# Patient Record
Sex: Female | Born: 1945 | Race: Black or African American | Hispanic: No | Smoking: Former smoker
Health system: Southern US, Community
[De-identification: ages and names within clinical notes are randomized; demographics above are authoritative.]

## PROBLEM LIST (undated history)

## (undated) DIAGNOSIS — E1121 Type 2 diabetes mellitus with diabetic nephropathy: Secondary | ICD-10-CM

## (undated) DIAGNOSIS — N183 Chronic kidney disease, stage 3 (moderate): Secondary | ICD-10-CM

## (undated) DIAGNOSIS — E1165 Type 2 diabetes mellitus with hyperglycemia: Secondary | ICD-10-CM

## (undated) DIAGNOSIS — E785 Hyperlipidemia, unspecified: Secondary | ICD-10-CM

## (undated) DIAGNOSIS — I509 Heart failure, unspecified: Secondary | ICD-10-CM

## (undated) DIAGNOSIS — I10 Essential (primary) hypertension: Secondary | ICD-10-CM

## (undated) DIAGNOSIS — Z8679 Personal history of other diseases of the circulatory system: Secondary | ICD-10-CM

## (undated) DIAGNOSIS — E669 Obesity, unspecified: Secondary | ICD-10-CM

## (undated) DIAGNOSIS — IMO0002 Reserved for concepts with insufficient information to code with codable children: Secondary | ICD-10-CM

## (undated) DIAGNOSIS — Z9889 Other specified postprocedural states: Secondary | ICD-10-CM

## (undated) HISTORY — DX: Personal history of other diseases of the circulatory system: Z86.79

## (undated) HISTORY — DX: Hyperlipidemia, unspecified: E78.5

## (undated) HISTORY — DX: Heart failure, unspecified: I50.9

## (undated) HISTORY — DX: Obesity, unspecified: E66.9

## (undated) HISTORY — DX: Type 2 diabetes mellitus with diabetic nephropathy: E11.21

## (undated) HISTORY — DX: Type 2 diabetes mellitus with hyperglycemia: E11.65

## (undated) HISTORY — DX: Other specified postprocedural states: Z98.890

## (undated) HISTORY — DX: Chronic kidney disease, stage 3 (moderate): N18.3

## (undated) HISTORY — DX: Reserved for concepts with insufficient information to code with codable children: IMO0002

---

## 1991-12-14 DIAGNOSIS — Z9889 Other specified postprocedural states: Secondary | ICD-10-CM

## 1991-12-14 DIAGNOSIS — Z8679 Personal history of other diseases of the circulatory system: Secondary | ICD-10-CM

## 1991-12-14 HISTORY — DX: Personal history of other diseases of the circulatory system: Z86.79

## 1991-12-14 HISTORY — PX: VENTRICULOPERITONEAL SHUNT: SHX204

## 1991-12-14 HISTORY — DX: Other specified postprocedural states: Z98.890

## 1991-12-14 HISTORY — PX: CRANIOTOMY: SHX93

## 2010-11-11 ENCOUNTER — Encounter: Admission: RE | Admit: 2010-11-11 | Discharge: 2010-11-11 | Payer: Self-pay | Admitting: Cardiology

## 2010-12-15 ENCOUNTER — Encounter
Admission: RE | Admit: 2010-12-15 | Discharge: 2010-12-15 | Payer: Self-pay | Source: Home / Self Care | Attending: Cardiology | Admitting: Cardiology

## 2011-02-22 ENCOUNTER — Other Ambulatory Visit: Payer: Self-pay | Admitting: Neurosurgery

## 2011-02-22 DIAGNOSIS — G919 Hydrocephalus, unspecified: Secondary | ICD-10-CM

## 2011-02-24 ENCOUNTER — Other Ambulatory Visit: Payer: Self-pay

## 2011-03-08 ENCOUNTER — Ambulatory Visit
Admission: RE | Admit: 2011-03-08 | Discharge: 2011-03-08 | Disposition: A | Discharge status: Home / Self Care | Payer: Medicare Other | Source: Ambulatory Visit | Attending: Neurosurgery | Admitting: Neurosurgery

## 2011-03-08 DIAGNOSIS — G919 Hydrocephalus, unspecified: Secondary | ICD-10-CM

## 2011-06-14 ENCOUNTER — Encounter: Payer: Self-pay | Admitting: Podiatry

## 2011-06-14 DIAGNOSIS — E119 Type 2 diabetes mellitus without complications: Secondary | ICD-10-CM | POA: Insufficient documentation

## 2011-08-17 ENCOUNTER — Inpatient Hospital Stay (INDEPENDENT_AMBULATORY_CARE_PROVIDER_SITE_OTHER)
Admission: RE | Admit: 2011-08-17 | Discharge: 2011-08-17 | Disposition: A | Discharge status: Home / Self Care | Payer: Medicare Other | Source: Ambulatory Visit | Attending: Family Medicine | Admitting: Family Medicine

## 2011-08-17 DIAGNOSIS — J40 Bronchitis, not specified as acute or chronic: Secondary | ICD-10-CM

## 2011-12-21 ENCOUNTER — Encounter: Payer: Self-pay | Admitting: Emergency Medicine

## 2011-12-21 ENCOUNTER — Emergency Department (INDEPENDENT_AMBULATORY_CARE_PROVIDER_SITE_OTHER)
Admission: EM | Admit: 2011-12-21 | Discharge: 2011-12-21 | Disposition: A | Discharge status: Home / Self Care | Payer: Medicare Other | Source: Home / Self Care | Attending: Emergency Medicine | Admitting: Emergency Medicine

## 2011-12-21 DIAGNOSIS — N390 Urinary tract infection, site not specified: Secondary | ICD-10-CM

## 2011-12-21 DIAGNOSIS — E1165 Type 2 diabetes mellitus with hyperglycemia: Secondary | ICD-10-CM

## 2011-12-21 HISTORY — DX: Essential (primary) hypertension: I10

## 2011-12-21 LAB — GLUCOSE, CAPILLARY

## 2011-12-21 LAB — POCT URINALYSIS DIP (DEVICE)
Bilirubin Urine: NEGATIVE
Glucose, UA: NEGATIVE mg/dL
Ketones, ur: NEGATIVE mg/dL
Specific Gravity, Urine: 1.015 (ref 1.005–1.030)

## 2011-12-21 MED ORDER — PHENAZOPYRIDINE HCL 200 MG PO TABS: 200.0000 mg | ORAL_TABLET | Freq: Three times a day (TID) | ORAL | Status: AC | PRN

## 2011-12-21 MED ORDER — CEPHALEXIN 500 MG PO CAPS: 500.0000 mg | ORAL_CAPSULE | Freq: Three times a day (TID) | ORAL | Status: AC

## 2011-12-21 NOTE — ED Provider Notes (Signed)
Chief Complaint  Patient presents with  . Urinary Tract Infection  . Blood Sugar Problem    History of Present Illness:  Candace Long is a 66 year old female with diabetes and hypertension who comes in today with her son. She's had a two-week history of burning with urination. She denies frequency, urgency, or hematuria. She's had some suprapubic pain over the past week that comes and goes. No fever, chills, nausea, or vomiting. No constipation, diarrhea, or blood in stool. Her son states her appetite has been poor for a while. Her blood sugars have not been very well controlled. She normally sees Dr. Shana Chute, but he is out on medical leave. Over the past week her blood sugars have been running in the 200s. She denies polyuria, polydipsia, or changes in her vision. She's had no hypoglycemia. She has a 14 year history of diabetes. She's on metformin 1000 mg twice a day and Lantus insulin 10 units a day in the morning.  Review of Systems:  Other than noted above, the patient denies any of the following symptoms: General:  No fevers, chills, sweats, aches, or fatigue. GI:  No abdominal pain, back pain, nausea, vomiting, diarrhea, or constipation. GU:  No dysuria, frequency, urgency, hematuria, incontinence. GYN:  No itching, discharge, pelvic pain, or abnormal menses.  PMFSH:  Past medical history, family history, social history, meds, and allergies were reviewed.  Physical Exam:   Vital signs:  BP 142/81  Pulse 106  Temp(Src) 99.1 F (37.3 C) (Oral)  Resp 22  SpO2 97% Gen:  Alert, oriented, in no distress. Lungs:  Clear to auscultation, no wheezes, rales or rhonchi. Heart:  Regular rhythm, no gallop or murmer. Abdomen:  Flat and soft.  There was slight suprapubic tenderness to palpation without guarding or rebound.  No hepato-splenomegaly or mass.  Bowel sounds were normally active. Back:  No CVA tenderness to percussion. Skin:  Clear, warm and dry.  Labs:   Results for orders placed  during the hospital encounter of 12/21/11  GLUCOSE, CAPILLARY      Component Value Range   Glucose-Capillary 223 (*) 70 - 99 (mg/dL)   Comment 1 Documented in Chart     Comment 2 Notify RN     Comment 3 Call MD NNP PA CNM    POCT URINALYSIS DIP (DEVICE)      Component Value Range   Glucose, UA NEGATIVE  NEGATIVE (mg/dL)   Bilirubin Urine NEGATIVE  NEGATIVE    Ketones, ur NEGATIVE  NEGATIVE (mg/dL)   Specific Gravity, Urine 1.015  1.005 - 1.030    Hgb urine dipstick MODERATE (*) NEGATIVE    pH 5.5  5.0 - 8.0    Protein, ur >=300 (*) NEGATIVE (mg/dL)   Urobilinogen, UA 0.2  0.0 - 1.0 (mg/dL)   Nitrite POSITIVE (*) NEGATIVE    Leukocytes, UA LARGE (*) NEGATIVE      Medications given in UCC:  None  Assessment:  Diagnoses that have been ruled out:  Diagnoses that are still under consideration:  Final diagnoses:  UTI (lower urinary tract infection)  Diabetes mellitus type 2, uncontrolled     Plan:   1.  The following meds were prescribed:   New Prescriptions   CEPHALEXIN (KEFLEX) 500 MG CAPSULE    Take 1 capsule (500 mg total) by mouth 3 (three) times daily.   PHENAZOPYRIDINE (PYRIDIUM) 200 MG TABLET    Take 1 tablet (200 mg total) by mouth 3 (three) times daily as needed for pain.  2.  The patient was instructed in symptomatic care and handouts were given. 3.  The patient was told to return if becoming worse in any way, if no better in 3 or 4 days, and given some red flag symptoms that would indicate earlier return. 4.  I suggested she followup with Dr. Sharyn Lull who is covering for Dr. Delane Ginger in about 2 weeks to followup on urinary tract infection and also her diabetes control.  In  the meantime she was instructed to increase her Lantus insulin to 15 units a day in the morning and to continue to monitor her blood sugars, to pay particular attention to hypoglycemia.    Candace Lias, MD 12/21/11 1556

## 2011-12-21 NOTE — ED Notes (Signed)
HERE WITH C/O PRESSURE WITH VOIDING,BURNING AND ITCHING AND ABDOMINAL SORENESS THAT STARTED LAST Monday.PT HAS HX DIABETES CONTROLLED BUT INSULIN AND METFORMIN.CBG THIS AM 214.DENIES BLURRED VISION,VOMITTING.

## 2011-12-21 NOTE — ED Notes (Signed)
Pt unable to provide urine specimen - drinking water at this time.

## 2012-01-12 ENCOUNTER — Other Ambulatory Visit: Payer: Self-pay | Admitting: Cardiology

## 2012-01-12 DIAGNOSIS — Z1231 Encounter for screening mammogram for malignant neoplasm of breast: Secondary | ICD-10-CM

## 2012-01-31 ENCOUNTER — Ambulatory Visit
Admission: RE | Admit: 2012-01-31 | Discharge: 2012-01-31 | Disposition: A | Discharge status: Home / Self Care | Payer: Medicare Other | Source: Ambulatory Visit | Attending: Cardiology | Admitting: Cardiology

## 2012-01-31 DIAGNOSIS — Z1231 Encounter for screening mammogram for malignant neoplasm of breast: Secondary | ICD-10-CM | POA: Diagnosis not present

## 2012-02-03 ENCOUNTER — Other Ambulatory Visit: Payer: Self-pay | Admitting: Cardiology

## 2012-02-03 DIAGNOSIS — R928 Other abnormal and inconclusive findings on diagnostic imaging of breast: Secondary | ICD-10-CM

## 2012-02-25 ENCOUNTER — Other Ambulatory Visit: Payer: Medicare Other

## 2012-02-29 ENCOUNTER — Ambulatory Visit
Admission: RE | Admit: 2012-02-29 | Discharge: 2012-02-29 | Disposition: A | Discharge status: Home / Self Care | Payer: Self-pay | Source: Ambulatory Visit | Attending: Cardiology | Admitting: Cardiology

## 2012-02-29 DIAGNOSIS — R928 Other abnormal and inconclusive findings on diagnostic imaging of breast: Secondary | ICD-10-CM

## 2012-03-06 DIAGNOSIS — M79609 Pain in unspecified limb: Secondary | ICD-10-CM | POA: Diagnosis not present

## 2012-03-06 DIAGNOSIS — B351 Tinea unguium: Secondary | ICD-10-CM | POA: Diagnosis not present

## 2012-08-07 DIAGNOSIS — I10 Essential (primary) hypertension: Secondary | ICD-10-CM | POA: Diagnosis not present

## 2012-08-07 DIAGNOSIS — E7800 Pure hypercholesterolemia, unspecified: Secondary | ICD-10-CM | POA: Diagnosis not present

## 2012-08-07 DIAGNOSIS — E119 Type 2 diabetes mellitus without complications: Secondary | ICD-10-CM | POA: Diagnosis not present

## 2012-08-11 DIAGNOSIS — E119 Type 2 diabetes mellitus without complications: Secondary | ICD-10-CM | POA: Diagnosis not present

## 2012-08-11 DIAGNOSIS — E7800 Pure hypercholesterolemia, unspecified: Secondary | ICD-10-CM | POA: Diagnosis not present

## 2012-08-11 DIAGNOSIS — I10 Essential (primary) hypertension: Secondary | ICD-10-CM | POA: Diagnosis not present

## 2012-10-16 DIAGNOSIS — M79609 Pain in unspecified limb: Secondary | ICD-10-CM | POA: Diagnosis not present

## 2012-10-16 DIAGNOSIS — B351 Tinea unguium: Secondary | ICD-10-CM | POA: Diagnosis not present

## 2012-12-13 HISTORY — PX: CARDIOVASCULAR STRESS TEST: SHX262

## 2012-12-25 ENCOUNTER — Other Ambulatory Visit (HOSPITAL_COMMUNITY): Payer: Self-pay | Admitting: Cardiology

## 2012-12-25 DIAGNOSIS — E7800 Pure hypercholesterolemia, unspecified: Secondary | ICD-10-CM | POA: Diagnosis not present

## 2012-12-25 DIAGNOSIS — R079 Chest pain, unspecified: Secondary | ICD-10-CM

## 2012-12-25 DIAGNOSIS — I10 Essential (primary) hypertension: Secondary | ICD-10-CM | POA: Diagnosis not present

## 2012-12-25 DIAGNOSIS — R0989 Other specified symptoms and signs involving the circulatory and respiratory systems: Secondary | ICD-10-CM | POA: Diagnosis not present

## 2013-01-03 ENCOUNTER — Encounter (HOSPITAL_COMMUNITY): Payer: Medicare Other

## 2013-01-03 ENCOUNTER — Other Ambulatory Visit: Payer: Self-pay | Admitting: Cardiology

## 2013-01-03 DIAGNOSIS — Z1231 Encounter for screening mammogram for malignant neoplasm of breast: Secondary | ICD-10-CM

## 2013-01-08 ENCOUNTER — Encounter (HOSPITAL_COMMUNITY)
Admission: RE | Admit: 2013-01-08 | Discharge: 2013-01-08 | Disposition: A | Discharge status: Home / Self Care | Payer: Medicare Other | Source: Ambulatory Visit | Attending: Cardiology | Admitting: Cardiology

## 2013-01-08 ENCOUNTER — Other Ambulatory Visit: Payer: Self-pay

## 2013-01-08 DIAGNOSIS — R079 Chest pain, unspecified: Secondary | ICD-10-CM | POA: Insufficient documentation

## 2013-01-08 DIAGNOSIS — M79609 Pain in unspecified limb: Secondary | ICD-10-CM | POA: Diagnosis not present

## 2013-01-08 DIAGNOSIS — R0989 Other specified symptoms and signs involving the circulatory and respiratory systems: Secondary | ICD-10-CM | POA: Diagnosis not present

## 2013-01-08 DIAGNOSIS — B351 Tinea unguium: Secondary | ICD-10-CM | POA: Diagnosis not present

## 2013-01-08 DIAGNOSIS — I10 Essential (primary) hypertension: Secondary | ICD-10-CM | POA: Diagnosis not present

## 2013-01-08 MED ORDER — TECHNETIUM TC 99M SESTAMIBI GENERIC - CARDIOLITE
10.0000 | Freq: Once | INTRAVENOUS | Status: AC | PRN
  Administered 2013-01-08: 10 via INTRAVENOUS

## 2013-01-08 MED ORDER — TECHNETIUM TC 99M SESTAMIBI GENERIC - CARDIOLITE
30.0000 | Freq: Once | INTRAVENOUS | Status: AC | PRN
  Administered 2013-01-08: 30 via INTRAVENOUS

## 2013-01-08 MED ORDER — REGADENOSON 0.4 MG/5ML IV SOLN
INTRAVENOUS | Status: AC
  Filled 2013-01-08: qty 5

## 2013-01-08 MED ORDER — REGADENOSON 0.4 MG/5ML IV SOLN
0.4000 mg | Freq: Once | INTRAVENOUS | Status: AC
  Administered 2013-01-08: 0.4 mg via INTRAVENOUS

## 2013-03-02 ENCOUNTER — Ambulatory Visit
Admission: RE | Admit: 2013-03-02 | Discharge: 2013-03-02 | Disposition: A | Discharge status: Home / Self Care | Payer: Medicare Other | Source: Ambulatory Visit | Attending: Cardiology | Admitting: Cardiology

## 2013-03-02 DIAGNOSIS — Z1231 Encounter for screening mammogram for malignant neoplasm of breast: Secondary | ICD-10-CM | POA: Diagnosis not present

## 2013-03-05 LAB — HM MAMMOGRAPHY

## 2013-03-13 ENCOUNTER — Ambulatory Visit: Payer: Medicare Other | Admitting: Family Medicine

## 2013-04-02 DIAGNOSIS — E119 Type 2 diabetes mellitus without complications: Secondary | ICD-10-CM | POA: Diagnosis not present

## 2013-04-02 DIAGNOSIS — E7800 Pure hypercholesterolemia, unspecified: Secondary | ICD-10-CM | POA: Diagnosis not present

## 2013-04-02 DIAGNOSIS — N189 Chronic kidney disease, unspecified: Secondary | ICD-10-CM | POA: Diagnosis not present

## 2013-04-02 DIAGNOSIS — I10 Essential (primary) hypertension: Secondary | ICD-10-CM | POA: Diagnosis not present

## 2013-06-14 DIAGNOSIS — M79609 Pain in unspecified limb: Secondary | ICD-10-CM | POA: Diagnosis not present

## 2013-06-14 DIAGNOSIS — B351 Tinea unguium: Secondary | ICD-10-CM | POA: Diagnosis not present

## 2013-06-29 DIAGNOSIS — E119 Type 2 diabetes mellitus without complications: Secondary | ICD-10-CM | POA: Diagnosis not present

## 2013-06-29 DIAGNOSIS — I10 Essential (primary) hypertension: Secondary | ICD-10-CM | POA: Diagnosis not present

## 2013-06-29 DIAGNOSIS — E7800 Pure hypercholesterolemia, unspecified: Secondary | ICD-10-CM | POA: Diagnosis not present

## 2013-06-29 LAB — LIPID PANEL
Cholesterol: 125 mg/dL (ref 0–200)
Cholesterol: 125 mg/dL (ref 0–200)
HDL: 32 mg/dL — AB (ref 35–70)
HDL: 32 mg/dL — AB (ref 35–70)
LDL (calc): 62
LDL (calc): 62
Triglycerides: 157
Triglycerides: 157

## 2013-06-29 LAB — HEPATIC FUNCTION PANEL
ALK PHOS: 110 U/L
ALK PHOS: 110 U/L
ALT: 13 U/L (ref 7–35)
ALT: 13 U/L (ref 7–35)
AST: 12 U/L
AST: 12 U/L
BILIRUBIN TOTAL: 0.5 mg/dL
BILIRUBIN TOTAL: 0.5 mg/dL

## 2013-06-29 LAB — BASIC METABOLIC PANEL
Creat: 1.52
Creat: 1.52
Potassium: 5 mmol/L
Potassium: 5 mmol/L
SODIUM: 134 mmol/L — AB (ref 137–147)
Sodium: 134 mmol/L — AB (ref 137–147)

## 2013-06-29 LAB — HEMOGLOBIN A1C
A1C: 9.3
A1c: 9.3

## 2013-07-02 DIAGNOSIS — I10 Essential (primary) hypertension: Secondary | ICD-10-CM | POA: Diagnosis not present

## 2013-07-02 DIAGNOSIS — E7800 Pure hypercholesterolemia, unspecified: Secondary | ICD-10-CM | POA: Diagnosis not present

## 2013-07-02 DIAGNOSIS — N189 Chronic kidney disease, unspecified: Secondary | ICD-10-CM | POA: Diagnosis not present

## 2013-08-11 LAB — CREATININE, SERUM: Creat: 1.4

## 2013-09-10 DIAGNOSIS — M79609 Pain in unspecified limb: Secondary | ICD-10-CM | POA: Diagnosis not present

## 2013-09-10 DIAGNOSIS — B351 Tinea unguium: Secondary | ICD-10-CM | POA: Diagnosis not present

## 2013-11-12 ENCOUNTER — Emergency Department (INDEPENDENT_AMBULATORY_CARE_PROVIDER_SITE_OTHER): Payer: Medicare Other

## 2013-11-12 ENCOUNTER — Encounter (HOSPITAL_COMMUNITY): Payer: Self-pay | Admitting: Emergency Medicine

## 2013-11-12 ENCOUNTER — Emergency Department (INDEPENDENT_AMBULATORY_CARE_PROVIDER_SITE_OTHER)
Admission: EM | Admit: 2013-11-12 | Discharge: 2013-11-12 | Disposition: A | Discharge status: Home / Self Care | Payer: Medicare Other | Source: Home / Self Care | Attending: Family Medicine | Admitting: Family Medicine

## 2013-11-12 DIAGNOSIS — J209 Acute bronchitis, unspecified: Secondary | ICD-10-CM | POA: Diagnosis not present

## 2013-11-12 DIAGNOSIS — R05 Cough: Secondary | ICD-10-CM | POA: Diagnosis not present

## 2013-11-12 DIAGNOSIS — R0989 Other specified symptoms and signs involving the circulatory and respiratory systems: Secondary | ICD-10-CM | POA: Diagnosis not present

## 2013-11-12 MED ORDER — DEXTROMETHORPHAN POLISTIREX 30 MG/5ML PO LQCR: 60.0000 mg | Freq: Two times a day (BID) | ORAL | Status: DC

## 2013-11-12 MED ORDER — AZITHROMYCIN 250 MG PO TABS: ORAL_TABLET | ORAL | Status: DC

## 2013-11-12 NOTE — ED Notes (Signed)
Patient assisted into gown.

## 2013-11-12 NOTE — ED Notes (Signed)
Cough, runny nose onset 11/25.  Patient has been using coricidin, no relief

## 2013-11-12 NOTE — ED Provider Notes (Signed)
CSN: [id number]     Arrival date & time 11/12/13  1143 History   First MD Initiated Contact with Patient 11/12/13 1314     Chief Complaint  Patient presents with  . Cough   (Consider location/radiation/quality/duration/timing/severity/associated sxs/prior Treatment) Patient is a 67 y.o. female presenting with cough. The history is provided by the patient.  Cough Cough characteristics:  Non-productive and dry Severity:  Mild Onset quality:  Gradual Duration:  1 week Progression:  Unchanged Chronicity:  New Smoker: no   Context: upper respiratory infection   Ineffective treatments:  Cough suppressants Associated symptoms: rhinorrhea and sinus congestion   Associated symptoms: no chills, no fever and no sore throat     Past Medical History  Diagnosis Date  . Hypertension   . Diabetes mellitus   . Aneurysm 1993   Past Surgical History  Procedure Laterality Date  . Cerebral aneurysm repair     No family history on file. History  Substance Use Topics  . Smoking status: Never Smoker   . Smokeless tobacco: Not on file  . Alcohol Use: No   OB History   Grav Para Term Preterm Abortions TAB SAB Ect Mult Living                 Review of Systems  Constitutional: Negative for fever and chills.  HENT: Positive for congestion, postnasal drip and rhinorrhea. Negative for sore throat.   Respiratory: Positive for cough.   Cardiovascular: Negative.   Gastrointestinal: Negative.     Allergies  Review of patient's allergies indicates no known allergies.  Home Medications   Current Outpatient Rx  Name  Route  Sig  Dispense  Refill  . DM-APAP-CPM (CORICIDIN HBP FLU PO)   Oral   Take by mouth.         . AMLODIPINE BESYLATE PO   Oral   Take 10 mg by mouth daily.           Marland Kitchen azithromycin (ZITHROMAX Z-PAK) 250 MG tablet      Take as directed on pack   6 each   0   . dextromethorphan (DELSYM) 30 MG/5ML liquid   Oral   Take 10 mLs (60 mg total) by mouth 2 (two) times  daily. Prn cough   89 mL   1   . insulin aspart (NOVOLOG) 100 UNIT/ML injection   Subcutaneous   Inject 10 Units into the skin 3 (three) times daily before meals.           Marland Kitchen lisinopril (PRINIVIL,ZESTRIL) 40 MG tablet   Oral   Take 40 mg by mouth daily.           . metFORMIN (GLUCOPHAGE) 1000 MG tablet   Oral   Take 1,000 mg by mouth 2 (two) times daily with a meal.           . minoxidil (LONITEN) 2.5 MG tablet   Oral   Take 2.5 mg by mouth daily.           Marland Kitchen spironolactone (ALDACTONE) 25 MG tablet   Oral   Take 25 mg by mouth daily.            BP 114/77  Pulse 82  Temp(Src) 99.6 F (37.6 C) (Oral)  Resp 18  SpO2 97% Physical Exam  Nursing note and vitals reviewed. Constitutional: She is oriented to person, place, and time. She appears well-developed and well-nourished.  HENT:  Head: Normocephalic.  Right Ear: External ear normal.  Left Ear:  External ear normal.  Mouth/Throat: Oropharynx is clear and moist.  Eyes: Conjunctivae are normal. Pupils are equal, round, and reactive to light.  Neck: Normal range of motion. Neck supple.  Cardiovascular: Normal rate, regular rhythm, normal heart sounds and intact distal pulses.   Pulmonary/Chest: Effort normal and breath sounds normal. She has no wheezes. She has no rales. She exhibits no tenderness.  Lymphadenopathy:    She has no cervical adenopathy.  Neurological: She is alert and oriented to person, place, and time.  Skin: Skin is warm and dry.    ED Course  Procedures (including critical care time) Labs Review Labs Reviewed - No data to display Imaging Review Dg Chest 2 View  11/12/2013   CLINICAL DATA:  Cough and congestion for 1 week.  Rule 0 pneumonia.  EXAM: CHEST  2 VIEW  COMPARISON:  11/11/2010  FINDINGS: Lungs are clear. The cardiomediastinal silhouette is within normal. 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  IMPRESSION: No acute cardiopulmonary disease.   Electronically Signed    By: Elberta Fortis M.D.   On: 11/12/2013 14:13    EKG Interpretation    Date/Time:    Ventricular Rate:    PR Interval:    QRS Duration:   QT Interval:    QTC Calculation:   R Axis:     Text Interpretation:              MDM  X-rays reviewed and report per radiologist.     Linna Hoff, MD 11/12/13 2132

## 2013-12-24 ENCOUNTER — Ambulatory Visit: Payer: Self-pay | Admitting: Podiatry

## 2013-12-31 ENCOUNTER — Encounter: Payer: Self-pay | Admitting: Podiatry

## 2013-12-31 ENCOUNTER — Ambulatory Visit (INDEPENDENT_AMBULATORY_CARE_PROVIDER_SITE_OTHER): Payer: Medicare Other | Admitting: Podiatry

## 2013-12-31 VITALS — BP 130/76 | HR 97 | Resp 16

## 2013-12-31 DIAGNOSIS — B351 Tinea unguium: Secondary | ICD-10-CM

## 2013-12-31 DIAGNOSIS — M79609 Pain in unspecified limb: Secondary | ICD-10-CM | POA: Diagnosis not present

## 2013-12-31 NOTE — Patient Instructions (Signed)
Diabetes and Foot Care Diabetes may cause you to have problems because of poor blood supply (circulation) to your feet and legs. This may cause the skin on your feet to become thinner, break easier, and heal more slowly. Your skin may become dry, and the skin may peel and crack. You may also have nerve damage in your legs and feet causing decreased feeling in them. You may not notice minor injuries to your feet that could lead to infections or more serious problems. Taking care of your feet is one of the most important things you can do for yourself.  HOME CARE INSTRUCTIONS  Wear shoes at all times, even in the house. Do not go barefoot. Bare feet are easily injured.  Check your feet daily for blisters, cuts, and redness. If you cannot see the bottom of your feet, use a mirror or ask someone for help.  Wash your feet with warm water (do not use hot water) and mild soap. Then pat your feet and the areas between your toes until they are completely dry. Do not soak your feet as this can dry your skin.  Apply a moisturizing lotion or petroleum jelly (that does not contain alcohol and is unscented) to the skin on your feet and to dry, brittle toenails. Do not apply lotion between your toes.  Trim your toenails straight across. Do not dig under them or around the cuticle. File the edges of your nails with an emery board or nail file.  Do not cut corns or calluses or try to remove them with medicine.  Wear clean socks or stockings every day. Make sure they are not too tight. Do not wear knee-high stockings since they may decrease blood flow to your legs.  Wear shoes that fit properly and have enough cushioning. To break in new shoes, wear them for just a few hours a day. This prevents you from injuring your feet. Always look in your shoes before you put them on to be sure there are no objects inside.  Do not cross your legs. This may decrease the blood flow to your feet.  If you find a minor scrape,  cut, or break in the skin on your feet, keep it and the skin around it clean and dry. These areas may be cleansed with mild soap and water. Do not cleanse the area with peroxide, alcohol, or iodine.  When you remove an adhesive bandage, be sure not to damage the skin around it.  If you have a wound, look at it several times a day to make sure it is healing.  Do not use heating pads or hot water bottles. They may burn your skin. If you have lost feeling in your feet or legs, you may not know it is happening until it is too late.  Make sure your health care provider performs a complete foot exam at least annually or more often if you have foot problems. Report any cuts, sores, or bruises to your health care provider immediately. SEEK MEDICAL CARE IF:   You have an injury that is not healing.  You have cuts or breaks in the skin.  You have an ingrown nail.  You notice redness on your legs or feet.  You feel burning or tingling in your legs or feet.  You have pain or cramps in your legs and feet.  Your legs or feet are numb.  Your feet always feel cold. SEEK IMMEDIATE MEDICAL CARE IF:   There is increasing redness,   swelling, or pain in or around a wound.  There is a red line that goes up your leg.  Pus is coming from a wound.  You develop a fever or as directed by your health care provider.  You notice a bad smell coming from an ulcer or wound. Document Released: 11/26/2000 Document Revised: 08/01/2013 Document Reviewed: 05/08/2013 [employer] Patient Information 2014 [employer].  

## 2013-12-31 NOTE — Progress Notes (Signed)
Subjective:     Patient ID: Candace Long, female   DOB: 1946/06/25, 68 y.o.   MRN: [id number]  HPI patient presents with nail disease and thickness 1-5 both feet   Review of Systems     Objective:   Physical Exam Neurovascular status unchanged with mycotic painful nailbeds 1-5 both feet    Assessment:     Debridement painful nailbeds 1-5 both feet with no iatrogenic bleeding noted    Plan:     Instructed on debridement of painful nail bed 1-5 both feet

## 2014-01-28 DIAGNOSIS — K1370 Unspecified lesions of oral mucosa: Secondary | ICD-10-CM | POA: Diagnosis not present

## 2014-02-05 ENCOUNTER — Ambulatory Visit: Payer: Medicare Other | Admitting: Family Medicine

## 2014-02-10 LAB — HM DIABETES EYE EXAM

## 2014-02-18 ENCOUNTER — Other Ambulatory Visit: Payer: Self-pay | Admitting: Family Medicine

## 2014-02-18 ENCOUNTER — Encounter: Payer: Self-pay | Admitting: Family Medicine

## 2014-02-18 ENCOUNTER — Ambulatory Visit (INDEPENDENT_AMBULATORY_CARE_PROVIDER_SITE_OTHER): Payer: Medicare Other | Admitting: Family Medicine

## 2014-02-18 VITALS — BP 138/84 | HR 88 | Temp 98.0°F | Ht 69.0 in | Wt 260.8 lb

## 2014-02-18 DIAGNOSIS — Z23 Encounter for immunization: Secondary | ICD-10-CM

## 2014-02-18 DIAGNOSIS — E1165 Type 2 diabetes mellitus with hyperglycemia: Secondary | ICD-10-CM

## 2014-02-18 DIAGNOSIS — I10 Essential (primary) hypertension: Secondary | ICD-10-CM

## 2014-02-18 DIAGNOSIS — E785 Hyperlipidemia, unspecified: Secondary | ICD-10-CM | POA: Diagnosis not present

## 2014-02-18 DIAGNOSIS — IMO0002 Reserved for concepts with insufficient information to code with codable children: Secondary | ICD-10-CM

## 2014-02-18 DIAGNOSIS — IMO0001 Reserved for inherently not codable concepts without codable children: Secondary | ICD-10-CM

## 2014-02-18 DIAGNOSIS — Z1231 Encounter for screening mammogram for malignant neoplasm of breast: Secondary | ICD-10-CM

## 2014-02-18 DIAGNOSIS — E669 Obesity, unspecified: Secondary | ICD-10-CM

## 2014-02-18 DIAGNOSIS — E1121 Type 2 diabetes mellitus with diabetic nephropathy: Secondary | ICD-10-CM | POA: Insufficient documentation

## 2014-02-18 LAB — BASIC METABOLIC PANEL
BUN: 25 mg/dL — AB (ref 6–23)
CO2: 24 mEq/L (ref 19–32)
Calcium: 9.8 mg/dL (ref 8.4–10.5)
Chloride: 103 mEq/L (ref 96–112)
Creatinine, Ser: 1.6 mg/dL — ABNORMAL HIGH (ref 0.4–1.2)
GFR: 41.25 mL/min — ABNORMAL LOW (ref >60.00)
Glucose, Bld: 299 mg/dL — ABNORMAL HIGH (ref 70–99)
POTASSIUM: 4.8 meq/L (ref 3.5–5.1)
SODIUM: 136 meq/L (ref 135–145)

## 2014-02-18 LAB — HEMOGLOBIN A1C: HEMOGLOBIN A1C: 10 % — AB (ref 4.6–6.5)

## 2014-02-18 MED ORDER — ATORVASTATIN CALCIUM 40 MG PO TABS: 40.0000 mg | ORAL_TABLET | Freq: Every day | ORAL | Status: DC

## 2014-02-18 MED ORDER — METFORMIN HCL 1000 MG PO TABS: 1000.0000 mg | ORAL_TABLET | Freq: Two times a day (BID) | ORAL | Status: DC

## 2014-02-18 MED ORDER — AMLODIPINE BESYLATE 10 MG PO TABS: 10.0000 mg | ORAL_TABLET | Freq: Every day | ORAL | Status: DC

## 2014-02-18 MED ORDER — SPIRONOLACTONE 25 MG PO TABS: 25.0000 mg | ORAL_TABLET | Freq: Every day | ORAL | Status: DC

## 2014-02-18 MED ORDER — INSULIN GLARGINE 100 UNIT/ML ~~LOC~~ SOLN: 21.0000 [IU] | Freq: Every day | SUBCUTANEOUS | Status: DC

## 2014-02-18 MED ORDER — HYDROCHLOROTHIAZIDE 25 MG PO TABS: 25.0000 mg | ORAL_TABLET | Freq: Every day | ORAL | Status: DC

## 2014-02-18 MED ORDER — MINOXIDIL 2.5 MG PO TABS: 2.5000 mg | ORAL_TABLET | Freq: Every day | ORAL | Status: DC

## 2014-02-18 MED ORDER — LISINOPRIL 40 MG PO TABS: 40.0000 mg | ORAL_TABLET | Freq: Every day | ORAL | Status: DC

## 2014-02-18 NOTE — Assessment & Plan Note (Signed)
Body mass index is 38.5 kg/(m^2).

## 2014-02-18 NOTE — Patient Instructions (Addendum)
Prevnar today. Blood work today. Change lantus 21 units to night time. Call your insurance about the shingles shot to see if it is covered or how much it would cost and where is cheaper (here or pharmacy).  If you want to receive here, call for nurse visit. We will call you to set up diabetes education. Schedule dentist and eye doctor appointment. Return to see me in 3 months for medicare wellness visit

## 2014-02-18 NOTE — Progress Notes (Signed)
Pre visit review using our clinic review tool, if applicable. No additional management support is needed unless otherwise documented below in the visit note. 

## 2014-02-18 NOTE — Assessment & Plan Note (Signed)
Chronic, stable. Sounds like difficult to control in past.  On 5 meds currently well controlled.

## 2014-02-18 NOTE — Assessment & Plan Note (Signed)
Compliant with metformin but forgets lantus - son helps her administer this and misses some mornings.  Advised change lantus to nightly and incorporate into bedtime routine. Uncontrolled based on recent sugars. Check Alc today. Foot exam today.  rec schedule eye exam. Will refer to diabetes education.

## 2014-02-18 NOTE — Progress Notes (Signed)
BP 138/84  Pulse 88  Temp(Src) 98 F (36.7 C) (Oral)  Ht 5\' 9"  (1.753 m)  Wt 260 lb 12.8 oz (118.298 kg)  BMI 38.50 kg/m2   CC: new pt to establish care  Subjective:    Patient ID: Candace Long, female    DOB: Apr 19, 1946, 68 y.o.   MRN: [id number]  HPI: Candace Long is a 68 y.o. female presenting on 02/18/2014 with Establish Care   Candace Long presents today with her son to establish care.  Prior saw Dr. Elsie Stain.  I have requested records from him today.  HTN - compliant with multiple med regimen including amlodipine 10mg  daily, hctz 25mg  daily, lisinopril 40mg  daily, minoxidil 2.5mg  daily, spironolactone 25mg  daily.  No HA, vision changes, CP/tightness, leg swelling.  Some dyspnea.  DM - complaint with metfomin 1000mg  bid as well as lantus 21u Qdaily.  Sometimes forgets to take in am - due to schedule changes.  Due for eye exam.  No paresthesias.  No low sugars of hypoglycemic sxs.  Brings log of sugars over last several months that range 180s-310.  Doesn't check daily.  Sees podiatrist regularly.  HLD - compliant with lipitor without myalgias.  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  Requests rec for local dentist. Requests legs taken a look at.  Wt Readings from Last 3 Encounters:  02/18/14 260 lb 12.8 oz (118.298 kg)    Preventative: On medicare for years.  No recent blood work.  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  Allergies and medications reviewed and  updated. Current Outpatient Prescriptions on File Prior to Visit  Medication Sig  . AMLODIPINE BESYLATE PO Take 10 mg by mouth daily.    Marland Kitchen lisinopril (PRINIVIL,ZESTRIL) 40 MG tablet Take 40 mg by mouth daily.    . metFORMIN (GLUCOPHAGE) 1000 MG tablet Take 1,000 mg by mouth 2 (two) times daily with a meal.    . minoxidil (LONITEN) 2.5 MG tablet Take 2.5 mg by mouth daily.    Marland Kitchen spironolactone (ALDACTONE) 25 MG tablet Take 25 mg by mouth daily.     No current facility-administered medications on file prior to visit.    Review of Systems Per HPI unless specifically indicated above    Objective:    BP 138/84  Pulse 88  Temp(Src) 98 F (36.7 C) (Oral)  Ht 5\' 9"  (1.753 m)  Wt 260 lb 12.8 oz (118.298 kg)  BMI 38.50 kg/m2  Physical Exam  Nursing note and vitals reviewed. Constitutional: She is oriented to person, place, and time. She appears well-developed and well-nourished. No distress.  HENT:  Head: Normocephalic and atraumatic.  Right Ear: Hearing, tympanic membrane, external ear and ear canal normal.  Left Ear: Hearing, tympanic membrane, external ear and ear canal normal.  Nose: Nose normal.  Mouth/Throat: Uvula is midline, oropharynx is clear and moist and mucous membranes are normal. No oropharyngeal exudate, posterior oropharyngeal edema or posterior oropharyngeal erythema.  Eyes: Conjunctivae and EOM are normal. Pupils are equal, round, and reactive to light. No scleral icterus.  Neck: Normal range of motion. Neck supple. Carotid bruit is not present.  Cardiovascular: Normal rate, regular rhythm, normal heart sounds and intact distal pulses.   No murmur heard. Pulses:      Radial pulses are 2+ on the right side, and 2+ on the left side.  Pulmonary/Chest: Effort normal and breath sounds normal. No respiratory distress. She has no wheezes. She has no rales.  Abdominal: Soft. Bowel sounds are normal. She exhibits no distension and no mass. There is no tenderness. There is no  rebound and no guarding.  Musculoskeletal: Normal range of motion. She exhibits no edema.  Diabetic foot exam: Normal inspection No skin breakdown No calluses  Normal DP/PT pulses Normal sensation to light touch and monofilament Nails normal  Lymphadenopathy:    She has no cervical adenopathy.  Neurological: She is alert and oriented to person, place, and time.  CN grossly intact, station and gait intact  Skin: Skin is warm and dry. No rash noted.  hyperkeratotic nodules left lateral lower extremity  Psychiatric: She has a normal mood and affect. Her behavior is normal. Judgment and thought content normal.       Assessment & Plan:   Problem List Items Addressed This Visit   Diabetes type 2, uncontrolled - Primary     Compliant with metformin but forgets lantus - son helps her administer this and misses some mornings.  Advised change lantus to nightly and incorporate into bedtime routine. Uncontrolled based on recent sugars. Check Alc today. Foot exam today.  rec schedule eye exam. Will refer to diabetes education.    Relevant Medications      insulin glargine (LANTUS) 100 UNIT/ML injection      atorvastatin (LIPITOR) 40 MG tablet   Other Relevant Orders      Ambulatory referral to Ophthalmology      Ambulatory referral to diabetic education      Hemoglobin A1c      Basic metabolic panel      HM DIABETES FOOT EXAM (Completed)   HLD (hyperlipidemia)     Chronic, continue liptor. Check FLP next fasting blood work    Relevant Medications      hydrochlorothiazide (HYDRODIURIL) 25 MG tablet   Hypertension     Chronic, stable. Sounds like difficult to control in past.  On 5 meds currently well controlled.    Obesity     Body mass index is 38.5 kg/(m^2).      Other Visit Diagnoses   Immunization due        Relevant Orders       Pneumococcal conjugate vaccine 13-valent        Follow up plan: Return in about 3 months (around 05/21/2014), or as needed, for medicare  wellness visit.

## 2014-02-18 NOTE — Assessment & Plan Note (Signed)
Chronic, continue liptor. Check FLP next fasting blood work

## 2014-02-19 ENCOUNTER — Telehealth: Payer: Self-pay | Admitting: Family Medicine

## 2014-02-19 NOTE — Telephone Encounter (Signed)
Relevant patient education assigned to patient using Emmi. ° °

## 2014-02-20 ENCOUNTER — Telehealth: Payer: Self-pay

## 2014-02-20 NOTE — Telephone Encounter (Signed)
Relevant patient education assigned to patient using Emmi. ° °

## 2014-02-25 ENCOUNTER — Encounter: Payer: Self-pay | Admitting: Family Medicine

## 2014-02-25 ENCOUNTER — Other Ambulatory Visit: Payer: Self-pay | Admitting: Family Medicine

## 2014-03-04 ENCOUNTER — Ambulatory Visit: Payer: Medicare Other

## 2014-03-11 ENCOUNTER — Ambulatory Visit
Admission: RE | Admit: 2014-03-11 | Discharge: 2014-03-11 | Disposition: A | Discharge status: Home / Self Care | Payer: Medicare Other | Source: Ambulatory Visit | Attending: Family Medicine | Admitting: Family Medicine

## 2014-03-11 DIAGNOSIS — Z1231 Encounter for screening mammogram for malignant neoplasm of breast: Secondary | ICD-10-CM | POA: Diagnosis not present

## 2014-03-14 ENCOUNTER — Encounter: Payer: Self-pay | Admitting: Family Medicine

## 2014-03-25 ENCOUNTER — Encounter: Payer: Self-pay | Admitting: Podiatry

## 2014-03-25 ENCOUNTER — Ambulatory Visit (INDEPENDENT_AMBULATORY_CARE_PROVIDER_SITE_OTHER): Payer: Medicare Other | Admitting: Podiatry

## 2014-03-25 DIAGNOSIS — M79609 Pain in unspecified limb: Secondary | ICD-10-CM | POA: Diagnosis not present

## 2014-03-25 DIAGNOSIS — B351 Tinea unguium: Secondary | ICD-10-CM

## 2014-03-26 NOTE — Progress Notes (Signed)
Subjective:     Patient ID: Candace Long, female   DOB: Jan 29, 1946, 68 y.o.   MRN: [id number]  HPI patient is found to have thick nailbeds 1-5 both feet that she can not cut herself and are painful   Review of Systems     Objective:   Physical Exam Brittle nailbeds 1-5 both feet that are painful and thick    Assessment:     Mycotic nail infection with pain 1-5 both feet    Plan:     Debridement painful nailbeds 1-5 both feet with no iatrogenic bleeding noted

## 2014-03-29 ENCOUNTER — Ambulatory Visit: Payer: Medicare Other | Admitting: *Deleted

## 2014-03-29 ENCOUNTER — Ambulatory Visit: Payer: Medicare Other | Admitting: Dietician

## 2014-04-01 ENCOUNTER — Encounter: Payer: Self-pay | Admitting: *Deleted

## 2014-04-02 ENCOUNTER — Encounter: Payer: Self-pay | Admitting: *Deleted

## 2014-04-12 DIAGNOSIS — H2510 Age-related nuclear cataract, unspecified eye: Secondary | ICD-10-CM | POA: Diagnosis not present

## 2014-04-12 DIAGNOSIS — H04129 Dry eye syndrome of unspecified lacrimal gland: Secondary | ICD-10-CM | POA: Diagnosis not present

## 2014-04-12 DIAGNOSIS — E119 Type 2 diabetes mellitus without complications: Secondary | ICD-10-CM | POA: Diagnosis not present

## 2014-05-07 ENCOUNTER — Ambulatory Visit: Payer: Medicare Other | Admitting: *Deleted

## 2014-05-24 ENCOUNTER — Encounter (INDEPENDENT_AMBULATORY_CARE_PROVIDER_SITE_OTHER): Payer: Self-pay

## 2014-05-24 ENCOUNTER — Ambulatory Visit (INDEPENDENT_AMBULATORY_CARE_PROVIDER_SITE_OTHER): Payer: Medicare Other | Admitting: Family Medicine

## 2014-05-24 ENCOUNTER — Encounter: Payer: Self-pay | Admitting: Family Medicine

## 2014-05-24 VITALS — BP 134/90 | HR 80 | Temp 98.6°F | Ht 69.0 in | Wt 255.8 lb

## 2014-05-24 DIAGNOSIS — N183 Chronic kidney disease, stage 3 unspecified: Secondary | ICD-10-CM

## 2014-05-24 DIAGNOSIS — I10 Essential (primary) hypertension: Secondary | ICD-10-CM

## 2014-05-24 DIAGNOSIS — R413 Other amnesia: Secondary | ICD-10-CM

## 2014-05-24 DIAGNOSIS — Z0000 Encounter for general adult medical examination without abnormal findings: Secondary | ICD-10-CM | POA: Insufficient documentation

## 2014-05-24 DIAGNOSIS — G3184 Mild cognitive impairment, so stated: Secondary | ICD-10-CM | POA: Insufficient documentation

## 2014-05-24 DIAGNOSIS — E785 Hyperlipidemia, unspecified: Secondary | ICD-10-CM | POA: Diagnosis not present

## 2014-05-24 DIAGNOSIS — IMO0001 Reserved for inherently not codable concepts without codable children: Secondary | ICD-10-CM | POA: Diagnosis not present

## 2014-05-24 DIAGNOSIS — E669 Obesity, unspecified: Secondary | ICD-10-CM

## 2014-05-24 DIAGNOSIS — IMO0002 Reserved for concepts with insufficient information to code with codable children: Secondary | ICD-10-CM

## 2014-05-24 DIAGNOSIS — Z1211 Encounter for screening for malignant neoplasm of colon: Secondary | ICD-10-CM | POA: Diagnosis not present

## 2014-05-24 DIAGNOSIS — N289 Disorder of kidney and ureter, unspecified: Secondary | ICD-10-CM | POA: Diagnosis not present

## 2014-05-24 DIAGNOSIS — E1165 Type 2 diabetes mellitus with hyperglycemia: Secondary | ICD-10-CM

## 2014-05-24 HISTORY — DX: Chronic kidney disease, stage 3 unspecified: N18.30

## 2014-05-24 LAB — CBC WITH DIFFERENTIAL/PLATELET
Basophils Absolute: 0 10*3/uL (ref 0.0–0.1)
Basophils Relative: 0.4 % (ref 0.0–3.0)
EOS ABS: 0.2 10*3/uL (ref 0.0–0.7)
Eosinophils Relative: 2.6 % (ref 0.0–5.0)
HCT: 35.3 % — ABNORMAL LOW (ref 36.0–46.0)
Hemoglobin: 11.4 g/dL — ABNORMAL LOW (ref 12.0–15.0)
Lymphocytes Relative: 22.1 % (ref 12.0–46.0)
Lymphs Abs: 2 10*3/uL (ref 0.7–4.0)
MCHC: 32.3 g/dL (ref 30.0–36.0)
MCV: 86.8 fl (ref 78.0–100.0)
MONO ABS: 0.4 10*3/uL (ref 0.1–1.0)
Monocytes Relative: 4.5 % (ref 3.0–12.0)
Neutro Abs: 6.3 10*3/uL (ref 1.4–7.7)
Neutrophils Relative %: 70.4 % (ref 43.0–77.0)
PLATELETS: 534 10*3/uL — AB (ref 150.0–400.0)
RBC: 4.06 Mil/uL (ref 3.87–5.11)
RDW: 13.8 % (ref 11.5–15.5)
WBC: 9 10*3/uL (ref 4.0–10.5)

## 2014-05-24 LAB — COMPREHENSIVE METABOLIC PANEL
ALBUMIN: 4 g/dL (ref 3.5–5.2)
ALK PHOS: 146 U/L — AB (ref 39–117)
ALT: 17 U/L (ref 0–35)
AST: 17 U/L (ref 0–37)
BUN: 20 mg/dL (ref 6–23)
CO2: 25 mEq/L (ref 19–32)
Calcium: 9.8 mg/dL (ref 8.4–10.5)
Chloride: 99 mEq/L (ref 96–112)
Creatinine, Ser: 1.6 mg/dL — ABNORMAL HIGH (ref 0.4–1.2)
GFR: 43.06 mL/min — ABNORMAL LOW (ref >60.00)
Glucose, Bld: 230 mg/dL — ABNORMAL HIGH (ref 70–99)
POTASSIUM: 5.3 meq/L — AB (ref 3.5–5.1)
SODIUM: 132 meq/L — AB (ref 135–145)
Total Bilirubin: 0.5 mg/dL (ref 0.2–1.2)
Total Protein: 7.6 g/dL (ref 6.0–8.3)

## 2014-05-24 LAB — LIPID PANEL
Cholesterol: 115 mg/dL (ref 0–200)
HDL: 35.6 mg/dL — ABNORMAL LOW (ref >39.00)
LDL CALC: 57 mg/dL (ref 0–99)
NONHDL: 79.4
TRIGLYCERIDES: 112 mg/dL (ref 0.0–149.0)
Total CHOL/HDL Ratio: 3
VLDL: 22.4 mg/dL (ref 0.0–40.0)

## 2014-05-24 LAB — VITAMIN B12: VITAMIN B 12: 350 pg/mL (ref 211–911)

## 2014-05-24 LAB — HEMOGLOBIN A1C: Hgb A1c MFr Bld: 11.3 % — ABNORMAL HIGH (ref 4.6–6.5)

## 2014-05-24 LAB — TSH: TSH: 0.81 u[IU]/mL (ref 0.35–4.50)

## 2014-05-24 NOTE — Assessment & Plan Note (Signed)
Body mass index is 37.75 kg/(m^2).

## 2014-05-24 NOTE — Assessment & Plan Note (Signed)
I have personally reviewed the Medicare Annual Wellness questionnaire and have noted 1. The patient's medical and social history 2. Their use of alcohol, tobacco or illicit drugs 3. Their current medications and supplements 4. The patient's functional ability including ADL's, fall risks, home safety risks and hearing or visual impairment. 5. Diet and physical activity 6. Evidence for depression or mood disorders The patients weight, height, BMI have been recorded in the chart.  Hearing and vision has been addressed. I have made referrals, counseling and provided education to the patient based review of the above and I have provided the pt with a written personalized care plan for preventive services. See scanned questionairre. Advanced directives discussed: son is HCPOA, will scan POA form into chart.  Reviewed preventative protocols and updated unless pt declined. Declines breast, pelvic/pap today - states she will reschedule this.

## 2014-05-24 NOTE — Assessment & Plan Note (Signed)
Found on recent blood work, recheck today.

## 2014-05-24 NOTE — Patient Instructions (Signed)
Call your insurace about the shingles shot to see if it is covered or how much it would cost and where is cheaper (here or pharmacy).  If you want to receive here, call for nurse visit. We (or GI department)will call you to schedule colonoscopy. We will call you to schedule bone density scan. I recommend coming back for a pelvic exam/pap smear at your convenience Return in 6-8 weeks for recheck diabetes and geriatric assessment (further memory testing) Good to see you, call us with questions.

## 2014-05-24 NOTE — Assessment & Plan Note (Signed)
Pretty significant memory deficits on testing today, will return for formal geriatric assessment. Son attributes this to h/o cerebral aneurysm.

## 2014-05-24 NOTE — Assessment & Plan Note (Signed)
Chronically uncontrolled. Discussed importance of regular lantus use. Check A1c - if elevated again, will slowly titrate med accordingly.

## 2014-05-24 NOTE — Assessment & Plan Note (Signed)
Chronic, stable. Continue lipitor.  

## 2014-05-24 NOTE — Assessment & Plan Note (Signed)
Chronic, stable. Continue current 53med regimen.

## 2014-05-24 NOTE — Progress Notes (Signed)
Pre visit review using our clinic review tool, if applicable. No additional management support is needed unless otherwise documented below in the visit note. 

## 2014-05-24 NOTE — Progress Notes (Signed)
BP 134/90  Pulse 80  Temp(Src) 98.6 F (37 C) (Oral)  Ht 5\' 9"  (1.753 m)  Wt 255 lb 12 oz (116.007 kg)  BMI 37.75 kg/m2   CC: subsequent medicare wellness  Subjective:    Patient ID: Candace Long, female    DOB: 1946/06/14, 68 y.o.   MRN: [id number]  HPI: Candace Long is a 68 y.o. female presenting on 05/24/2014 for Annual Exam   DM - checks fasting in am. This am sugar 200s, did not receive lantus last night 2/2 arriving late. Usually 150s. Compliant with meds.  Memory trouble - since aneurysm 1991. Son doesn't think it has gotten significantly worse.  Long term memory is fine.  Short term memory has been impaired for last 20+ yrs.  Failed hearing screen. Worked in [employer] fund[employer].  Recent vision screen. Denies depression, falls or anhedonia.  Preventative:  On medicare for years. No recent blood work. H/o disability after cerebral aneurysm repaired  Mammogram 03/2014 WNL.  Colon cancer screening - discussed. Would like to schedule colonoscopy. Dexa - would like to schedule. Well woman - no recent pelvic exam. Requests deferred today, will reschedule. Flu - didn't get  Prevnar early 02/2014 Shingles - not done  Advanced directive - Candace Long is [name].  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 Craniotomy  1993    cerebral aneurysm repair  . Ventriculoperitoneal shunt  1993  . Cardiovascular stress test  12/2012    WNL EF 55% ([facility]wani)     Relevant past medical, surgical, family and social history reviewed and updated as indicated.  Allergies and medications reviewed and updated. Current Outpatient Prescriptions on File Prior to Visit  Medication Sig  . amLODipine (NORVASC) 10 MG tablet Take 1 tablet (10 mg total) by mouth daily.  Marland Kitchen  atorvastatin (LIPITOR) 40 MG tablet Take 1 tablet (40 mg total) by mouth daily.  . hydrochlorothiazide (HYDRODIURIL) 25 MG tablet Take 1 tablet (25 mg total) by mouth daily.  . insulin glargine (LANTUS) 100 UNIT/ML injection Inject 0.21 mLs (21 Units total) into the skin daily. Qs 3 months  . lisinopril (PRINIVIL,ZESTRIL) 40 MG tablet Take 1 tablet (40 mg total) by mouth daily.  . metFORMIN (GLUCOPHAGE) 1000 MG tablet Take 1 tablet (1,000 mg total) by mouth 2 (two) times daily with a meal.  . minoxidil (LONITEN) 2.5 MG tablet Take 1 tablet (2.5 mg total) by mouth daily.  Marland Kitchen spironolactone (ALDACTONE) 25 MG tablet Take 1 tablet (25 mg total) by mouth daily.   No current facility-administered medications on file prior to visit.    Review of Systems Per HPI unless specifically indicated above    Objective:    BP 134/90  Pulse 80  Temp(Src) 98.6 F (37 C) (Oral)  Ht 5\' 9"  (1.753 m)  Wt 255 lb 12 oz (116.007 kg)  BMI 37.75 kg/m2  Physical Exam  Nursing note and vitals reviewed. Constitutional: She is oriented to person, place, and time. She appears well-developed and well-nourished. No distress.  HENT:  Head: Normocephalic and atraumatic.  Right Ear: Hearing, tympanic membrane, external ear and ear canal normal.  Left Ear: Hearing, tympanic membrane, external ear and ear canal normal.  Nose: Nose normal.  Mouth/Throat: Uvula is midline, oropharynx is clear and moist and mucous  membranes are normal. No oropharyngeal exudate, posterior oropharyngeal edema or posterior oropharyngeal erythema.  Eyes: Conjunctivae and EOM are normal. Pupils are equal, round, and reactive to light. No scleral icterus.  Neck: Normal range of motion. Neck supple. Carotid bruit is not present. No thyromegaly present.  Cardiovascular: Normal rate, regular rhythm, normal heart sounds and intact distal pulses.   No murmur heard. Pulses:      Radial pulses are 2+ on the right side, and 2+ on the left side.    Pulmonary/Chest: Effort normal and breath sounds normal. No respiratory distress. She has no wheezes. She has no rales.  breast - pt declined today  Abdominal: Soft. Bowel sounds are normal. She exhibits no distension and no mass. There is no tenderness. There is no rebound and no guarding.  Genitourinary:  Pt declined todya  Musculoskeletal: Normal range of motion. She exhibits no edema.  Lymphadenopathy:    She has no cervical adenopathy.  Neurological: She is alert and oriented to person, place, and time.  CN grossly intact, station and gait intact President: Bush Recall 1/3 even with cues Calculation 1/5 serial 3s, 3/5 D-L-[id number]  Skin: Skin is warm and dry. No rash noted.  Psychiatric: She has a normal mood and affect. Her behavior is normal. Judgment and thought content normal.       Assessment & Plan:   Problem List Items Addressed This Visit   Renal insufficiency     Found on recent blood work, recheck today.    Obesity     Body mass index is 37.75 kg/(m^2).     Memory deficits     Pretty significant memory deficits on testing today, will return for formal geriatric assessment. Son attributes this to h/o cerebral aneurysm.    Relevant Orders      CBC with Differential      Vitamin B12      RPR   Medicare annual wellness visit, initial - Primary     I have personally reviewed the Medicare Annual Wellness questionnaire and have noted 1. The patient's medical and social history 2. Their use of alcohol, tobacco or illicit drugs 3. Their current medications and supplements 4. The patient's functional ability including ADL's, fall risks, home safety risks and hearing or visual impairment. 5. Diet and physical activity 6. Evidence for depression or mood disorders The patients weight, height, BMI have been recorded in the chart.  Hearing and vision has been addressed. I have made referrals, counseling and provided education to the patient based review of the above and I  have provided the pt with a written personalized care plan for preventive services. See scanned questionairre. Advanced directives discussed: son is HCPOA, will scan POA form into chart.  Reviewed preventative protocols and updated unless pt declined. Declines breast, pelvic/pap today - states she will reschedule this.    Hypertension     Chronic, stable. Continue current 32med regimen.    Relevant Orders      TSH   HLD (hyperlipidemia)     Chronic, stable. Continue lipitor.    Relevant Orders      Lipid panel      Comprehensive metabolic panel   Diabetes type 2, uncontrolled     Chronically uncontrolled. Discussed importance of regular lantus use. Check A1c - if elevated again, will slowly titrate med accordingly.    Relevant Orders      Hemoglobin A1c    Other Visit Diagnoses   Special screening for malignant neoplasms, colon  Relevant Orders       Ambulatory referral to Gastroenterology        Follow up plan: Return in about 2 months (around 07/24/2014), or as needed, for geriatric assessment.

## 2014-05-25 ENCOUNTER — Encounter: Payer: Self-pay | Admitting: Family Medicine

## 2014-05-25 ENCOUNTER — Other Ambulatory Visit: Payer: Self-pay | Admitting: Family Medicine

## 2014-05-25 LAB — RPR

## 2014-05-27 ENCOUNTER — Other Ambulatory Visit: Payer: Self-pay | Admitting: Family Medicine

## 2014-05-27 ENCOUNTER — Other Ambulatory Visit: Payer: Medicare Other

## 2014-05-27 ENCOUNTER — Encounter: Payer: Self-pay | Admitting: Family Medicine

## 2014-05-27 DIAGNOSIS — I10 Essential (primary) hypertension: Secondary | ICD-10-CM

## 2014-05-27 DIAGNOSIS — E785 Hyperlipidemia, unspecified: Secondary | ICD-10-CM

## 2014-05-27 DIAGNOSIS — N183 Chronic kidney disease, stage 3 unspecified: Secondary | ICD-10-CM

## 2014-05-27 DIAGNOSIS — E669 Obesity, unspecified: Secondary | ICD-10-CM

## 2014-05-27 DIAGNOSIS — E1165 Type 2 diabetes mellitus with hyperglycemia: Principal | ICD-10-CM

## 2014-05-27 DIAGNOSIS — IMO0002 Reserved for concepts with insufficient information to code with codable children: Secondary | ICD-10-CM

## 2014-05-27 DIAGNOSIS — E1121 Type 2 diabetes mellitus with diabetic nephropathy: Secondary | ICD-10-CM

## 2014-05-27 LAB — VITAMIN D 25 HYDROXY (VIT D DEFICIENCY, FRACTURES): VITD: 21.19 ng/mL

## 2014-05-27 MED ORDER — VITAMIN D 1000 UNITS PO CAPS: 1000.0000 [IU] | ORAL_CAPSULE | Freq: Every day | ORAL | Status: AC

## 2014-05-27 NOTE — Addendum Note (Signed)
Addended by: Ellamae Sia on: 05/27/2014 12:44 PM   Modules accepted: Orders

## 2014-06-04 ENCOUNTER — Encounter: Payer: Self-pay | Admitting: Internal Medicine

## 2014-06-11 ENCOUNTER — Telehealth: Payer: Self-pay

## 2014-06-11 ENCOUNTER — Other Ambulatory Visit: Payer: Self-pay

## 2014-06-11 MED ORDER — INSULIN GLARGINE 100 UNIT/ML SOLOSTAR PEN: 30.0000 [IU] | PEN_INJECTOR | Freq: Every day | SUBCUTANEOUS | Status: DC

## 2014-06-11 NOTE — Telephone Encounter (Signed)
Received fax from [facility] approving Lantus Solostar through 12/12/2014  Reference # [id number]

## 2014-06-11 NOTE — Telephone Encounter (Addendum)
[facility] [place] [facility] and approved. Patient's son notified. Med list updated to reflect pen instead of vial.

## 2014-06-11 NOTE — Telephone Encounter (Signed)
Pt's son Margette Fast said [facility] requesting refills for Lantus from Dr Terrence Dupont. Pt has been out of Lantus for 4 or 5 days. Pt is not having any symptoms but todays FBS was 285. Pt has gotten Lantus dose to 23 units per day. Charles request cb when refilled. Juanda Crumble also said pt had been getting [facility], commercial; med list has Lantus in vial.Please advise.

## 2014-06-21 ENCOUNTER — Ambulatory Visit (AMBULATORY_SURGERY_CENTER): Payer: Medicare Other

## 2014-06-21 VITALS — Ht 68.0 in | Wt 255.0 lb

## 2014-06-21 DIAGNOSIS — Z1211 Encounter for screening for malignant neoplasm of colon: Secondary | ICD-10-CM

## 2014-06-21 MED ORDER — MOVIPREP 100 G PO SOLR: 1.0000 | Freq: Once | ORAL | Status: DC

## 2014-06-21 NOTE — Progress Notes (Signed)
No allergies to eggs or soy No home oxygen No diet/weight loss meds No past problems with anesthesia  Has email  Emmi instructions given for colonoscopy 

## 2014-06-28 ENCOUNTER — Ambulatory Visit: Payer: Self-pay | Admitting: Family Medicine

## 2014-06-28 DIAGNOSIS — E669 Obesity, unspecified: Secondary | ICD-10-CM | POA: Diagnosis not present

## 2014-06-28 DIAGNOSIS — E785 Hyperlipidemia, unspecified: Secondary | ICD-10-CM | POA: Diagnosis not present

## 2014-06-28 DIAGNOSIS — E119 Type 2 diabetes mellitus without complications: Secondary | ICD-10-CM | POA: Diagnosis not present

## 2014-06-28 DIAGNOSIS — I10 Essential (primary) hypertension: Secondary | ICD-10-CM | POA: Diagnosis not present

## 2014-06-28 DIAGNOSIS — Z7189 Other specified counseling: Secondary | ICD-10-CM | POA: Diagnosis not present

## 2014-07-04 ENCOUNTER — Telehealth: Payer: Self-pay | Admitting: Internal Medicine

## 2014-07-04 ENCOUNTER — Encounter: Payer: Self-pay | Admitting: Family Medicine

## 2014-07-04 NOTE — Telephone Encounter (Signed)
Talked with pt's son.  He will pick up prep at pharmacy now

## 2014-07-04 NOTE — Telephone Encounter (Signed)
Called in [facility].  No answer at pt's contact number;  I will call later.

## 2014-07-04 NOTE — Telephone Encounter (Signed)
This note was forwarded to Ulice Dash, RN per her request.

## 2014-07-05 ENCOUNTER — Telehealth: Payer: Self-pay | Admitting: Internal Medicine

## 2014-07-05 ENCOUNTER — Encounter: Payer: Medicare Other | Admitting: Internal Medicine

## 2014-07-05 NOTE — Telephone Encounter (Signed)
Spoke with son and advised that it would be best for pt to have a PV before her procedure because more than a month will have passed between her PV and procedure.  Understanding voiced.  He states concern about the prep cost.  Note put into appointment notes to use Miralax split prep  PV made for 07-19-14 at 4:00 p.m.

## 2014-07-05 NOTE — Telephone Encounter (Signed)
Called and spoke with patient's son and he states that the patient didn't pick up prep until later yesterday evening.  Patient had already eaten and she did not do the evening prep. I advised him that she will have to reschedule and a note will be sent to Dr. Henrene Pastor.  Also advised that she may be charged for not canceling within 24 hours.  He understood and call was transferred to [place]. To rechedule appointment.  Dr. Henrene Pastor called and stated to charge patient.  Note sent to Irwin Brakeman.

## 2014-07-08 ENCOUNTER — Ambulatory Visit: Payer: Medicare Other | Admitting: Podiatry

## 2014-07-13 ENCOUNTER — Ambulatory Visit: Payer: Self-pay | Admitting: Family Medicine

## 2014-07-13 HISTORY — PX: COLONOSCOPY: SHX174

## 2014-07-19 ENCOUNTER — Ambulatory Visit (AMBULATORY_SURGERY_CENTER): Payer: Medicare Other | Admitting: *Deleted

## 2014-07-19 VITALS — Ht 69.0 in | Wt 257.6 lb

## 2014-07-19 DIAGNOSIS — Z1211 Encounter for screening for malignant neoplasm of colon: Secondary | ICD-10-CM

## 2014-07-19 MED ORDER — MOVIPREP 100 G PO SOLR: ORAL | Status: DC

## 2014-07-19 NOTE — Progress Notes (Signed)
No allergies to eggs or soy. No problems with anesthesia.  Pt given Emmi instructions for colonoscopy  No oxygen use  No diet drug use  Pt given voucher for free MoviPrep

## 2014-07-22 ENCOUNTER — Encounter: Payer: Self-pay | Admitting: Podiatry

## 2014-07-22 ENCOUNTER — Ambulatory Visit (INDEPENDENT_AMBULATORY_CARE_PROVIDER_SITE_OTHER): Payer: Medicare Other | Admitting: Podiatry

## 2014-07-22 DIAGNOSIS — M79673 Pain in unspecified foot: Secondary | ICD-10-CM

## 2014-07-22 DIAGNOSIS — M79609 Pain in unspecified limb: Secondary | ICD-10-CM

## 2014-07-22 DIAGNOSIS — B351 Tinea unguium: Secondary | ICD-10-CM

## 2014-07-22 NOTE — Progress Notes (Signed)
Subjective:     Patient ID: Candace Long, female   DOB: 1946-06-20, 68 y.o.   MRN: [id number]  HPI patient presents with thick yellow brittle toenails 1-5 both feet that are painful and she cannot cut   Review of Systems     Objective:   Physical Exam Neurovascular status unchanged with thick yellow brittle nailbeds 1-5 both feet    Assessment:     Mycotic nail infection with pain 1-5 both feet    Plan:     Debris painful nailbeds 1-5 both feet with no iatrogenic bleeding noted

## 2014-07-22 NOTE — Patient Instructions (Signed)
Diabetes and Foot Care Diabetes may cause you to have problems because of poor blood supply (circulation) to your feet and legs. This may cause the skin on your feet to become thinner, break easier, and heal more slowly. Your skin may become dry, and the skin may peel and crack. You may also have nerve damage in your legs and feet causing decreased feeling in them. You may not notice minor injuries to your feet that could lead to infections or more serious problems. Taking care of your feet is one of the most important things you can do for yourself.  HOME CARE INSTRUCTIONS  Wear shoes at all times, even in the house. Do not go barefoot. Bare feet are easily injured.  Check your feet daily for blisters, cuts, and redness. If you cannot see the bottom of your feet, use a mirror or ask someone for help.  Wash your feet with warm water (do not use hot water) and mild soap. Then pat your feet and the areas between your toes until they are completely dry. Do not soak your feet as this can dry your skin.  Apply a moisturizing lotion or petroleum jelly (that does not contain alcohol and is unscented) to the skin on your feet and to dry, brittle toenails. Do not apply lotion between your toes.  Trim your toenails straight across. Do not dig under them or around the cuticle. File the edges of your nails with an emery board or nail file.  Do not cut corns or calluses or try to remove them with medicine.  Wear clean socks or stockings every day. Make sure they are not too tight. Do not wear knee-high stockings since they may decrease blood flow to your legs.  Wear shoes that fit properly and have enough cushioning. To break in new shoes, wear them for just a few hours a day. This prevents you from injuring your feet. Always look in your shoes before you put them on to be sure there are no objects inside.  Do not cross your legs. This may decrease the blood flow to your feet.  If you find a minor scrape,  cut, or break in the skin on your feet, keep it and the skin around it clean and dry. These areas may be cleansed with mild soap and water. Do not cleanse the area with peroxide, alcohol, or iodine.  When you remove an adhesive bandage, be sure not to damage the skin around it.  If you have a wound, look at it several times a day to make sure it is healing.  Do not use heating pads or hot water bottles. They may burn your skin. If you have lost feeling in your feet or legs, you may not know it is happening until it is too late.  Make sure your health care provider performs a complete foot exam at least annually or more often if you have foot problems. Report any cuts, sores, or bruises to your health care provider immediately. SEEK MEDICAL CARE IF:   You have an injury that is not healing.  You have cuts or breaks in the skin.  You have an ingrown nail.  You notice redness on your legs or feet.  You feel burning or tingling in your legs or feet.  You have pain or cramps in your legs and feet.  Your legs or feet are numb.  Your feet always feel cold. SEEK IMMEDIATE MEDICAL CARE IF:   There is increasing redness,   swelling, or pain in or around a wound.  There is a red line that goes up your leg.  Pus is coming from a wound.  You develop a fever or as directed by your health care provider.  You notice a bad smell coming from an ulcer or wound. Document Released: 11/26/2000 Document Revised: 08/01/2013 Document Reviewed: 05/08/2013 ExitCare Patient Information 2015 [employer]. This information is not intended to replace advice given to you by your health care provider. Make sure you discuss any questions you have with your health care provider.  

## 2014-07-26 ENCOUNTER — Ambulatory Visit: Payer: Medicare Other | Admitting: Family Medicine

## 2014-07-29 ENCOUNTER — Other Ambulatory Visit: Payer: Self-pay | Admitting: Internal Medicine

## 2014-07-30 ENCOUNTER — Telehealth: Payer: Self-pay | Admitting: Internal Medicine

## 2014-07-30 NOTE — Telephone Encounter (Signed)
[facility]; they cannot honor free MoviPrep voucher because pt has recently had one.  Son is coming by office now to pick up sample of free MoviPrep

## 2014-07-30 NOTE — Telephone Encounter (Signed)
[facility] [address] and they stated that they had to send a fax to [place] for them to authorize Rx. For Moviprep again. The original Rx. Had expired so I authorized via telephone to fill Rx.  I then called Candace Long, son of patient, to inform him that Rx. Is being filled and he is to go by and pick up today.  He agreed to do so.

## 2014-07-31 ENCOUNTER — Encounter: Payer: Self-pay | Admitting: Internal Medicine

## 2014-07-31 ENCOUNTER — Ambulatory Visit (AMBULATORY_SURGERY_CENTER): Payer: Medicare Other | Admitting: Internal Medicine

## 2014-07-31 VITALS — BP 121/81 | HR 73 | Temp 96.9°F | Resp 18 | Ht 69.0 in | Wt 255.0 lb

## 2014-07-31 DIAGNOSIS — D126 Benign neoplasm of colon, unspecified: Secondary | ICD-10-CM

## 2014-07-31 DIAGNOSIS — Z1211 Encounter for screening for malignant neoplasm of colon: Secondary | ICD-10-CM

## 2014-07-31 DIAGNOSIS — E119 Type 2 diabetes mellitus without complications: Secondary | ICD-10-CM | POA: Diagnosis not present

## 2014-07-31 DIAGNOSIS — I10 Essential (primary) hypertension: Secondary | ICD-10-CM | POA: Diagnosis not present

## 2014-07-31 LAB — GLUCOSE, CAPILLARY
Glucose-Capillary: 182 mg/dL — ABNORMAL HIGH (ref 70–99)
Glucose-Capillary: 172 mg/dL — ABNORMAL HIGH (ref 70–99)

## 2014-07-31 MED ORDER — SODIUM CHLORIDE 0.9 % IV SOLN: 500.0000 mL | INTRAVENOUS | Status: DC

## 2014-07-31 NOTE — Patient Instructions (Signed)
YOU HAD AN ENDOSCOPIC PROCEDURE TODAY AT [facility]: Refer to the procedure report that was given to you for any specific questions about what was found during the examination.  If the procedure report does not answer your questions, please call your gastroenterologist to clarify.  If you requested that your care partner not be given the details of your procedure findings, then the procedure report has been included in a sealed envelope for you to review at your convenience later.  YOU SHOULD EXPECT: Some feelings of bloating in the abdomen. Passage of more gas than usual.  Walking can help get rid of the air that was put into your GI tract during the procedure and reduce the bloating. If you had a lower endoscopy (such as a colonoscopy or flexible sigmoidoscopy) you may notice spotting of blood in your stool or on the toilet paper. If you underwent a bowel prep for your procedure, then you may not have a normal bowel movement for a few days.  DIET: Your first meal following the procedure should be a light meal and then it is ok to progress to your normal diet.  A half-sandwich or bowl of soup is an example of a good first meal.  Heavy or fried foods are harder to digest and may make you feel nauseous or bloated.  Likewise meals heavy in dairy and vegetables can cause extra gas to form and this can also increase the bloating.  Drink plenty of fluids but you should avoid alcoholic beverages for 24 hours.  ACTIVITY: Your care partner should take you home directly after the procedure.  You should plan to take it easy, moving slowly for the rest of the day.  You can resume normal activity the day after the procedure however you should NOT DRIVE or use heavy machinery for 24 hours (because of the sedation medicines used during the test).    SYMPTOMS TO REPORT IMMEDIATELY: A gastroenterologist can be reached at any hour.  During normal business hours, 8:30 AM to 5:00 PM Monday through Friday,  call [phone].  After hours and on weekends, please call the GI answering service at [phone] who will take a message and have the physician on call contact you.   Following lower endoscopy (colonoscopy or flexible sigmoidoscopy):  Excessive amounts of blood in the stool  Significant tenderness or worsening of abdominal pains  Swelling of the abdomen that is new, acute  Fever of 100F or higher  F FOLLOW UP: If any biopsies were taken you will be contacted by phone or by letter within the next 1-3 weeks.  Call your gastroenterologist if you have not heard about the biopsies in 3 weeks.  Our staff will call the home number listed on your records the next business day following your procedure to check on you and address any questions or concerns that you may have at that time regarding the information given to you following your procedure. This is a courtesy call and so if there is no answer at the home number and we have not heard from you through the emergency physician on call, we will assume that you have returned to your regular daily activities without incident.  SIGNATURES/CONFIDENTIALITY: You and/or your care partner have signed paperwork which will be entered into your electronic medical record.  These signatures attest to the fact that that the information above on your After Visit Summary has been reviewed and is understood.  Full responsibility of the confidentiality of  this discharge information lies with you and/or your care-partner.  Polyp,diverticulosis and high fiber diet information given.,

## 2014-07-31 NOTE — Op Note (Addendum)
[facility]  [address] [place], [address]   COLONOSCOPY PROCEDURE REPORT  PATIENT: Candace, Long  MR#: [id number] BIRTHDATE: [birth year] , 54  yrs. old GENDER: Female ENDOSCOPIST: Eustace Quail, MD REFERRED TD:DUKGUR Gutierrez, M.D. PROCEDURE DATE:  07/31/2014 PROCEDURE:   Colonoscopy with snare polypectomy x 1 First Screening Colonoscopy - Avg.  risk and is 50 yrs.  old or older Yes.  Prior Negative Screening - Now for repeat screening. N/A  History of Adenoma - Now for follow-up colonoscopy & has been > or = to 3 yrs.  N/A  Polyps Removed Today? Yes. ASA CLASS:   Class II INDICATIONS:average risk screening. MEDICATIONS: MAC sedation, administered by CRNA and propofol (Diprivan) 300mg  IV  DESCRIPTION OF PROCEDURE:   After the risks benefits and alternatives of the procedure were thoroughly explained, informed consent was obtained.  A digital rectal exam revealed no abnormalities of the rectum.   The [facility] [id number]  endoscope was introduced through the anus and advanced to the cecum, which was identified by both the appendix and ileocecal valve. No adverse events experienced.   The quality of the prep was excellent, using MoviPrep  The instrument was then slowly withdrawn as the colon was fully examined.  COLON FINDINGS: A diminutive polyp was found in the sigmoid colon. A polypectomy was performed with a cold snare.  The resection was complete and the polyp tissue was completely retrieved.   Severe diverticulosis was noted The finding was in the left colon.   The colon mucosa was otherwise normal.  Retroflexed views revealed internal hemorrhoids. The time to cecum=3 minutes 27 seconds. Withdrawal time=16 minutes 57 seconds.  The scope was withdrawn and the procedure completed. COMPLICATIONS: There were no complications.  ENDOSCOPIC IMPRESSION: 1.   Diminutive polyp was found in the sigmoid colon; polypectomy was performed with a cold snare 2.    Severe diverticulosis was noted in the left colon 3.   The colon mucosa was otherwise normal  RECOMMENDATIONS: 1. Repeat colonoscopy in 5 years if polyp adenomatous; otherwise 10 years   eSigned:  Eustace Quail, MD 07/31/2014 2:40 PM   cc: Ria Bush MD and The Patient

## 2014-07-31 NOTE — Progress Notes (Signed)
Called to room to assist during endoscopic procedure.  Patient ID and intended procedure confirmed with present staff. Received instructions for my participation in the procedure from the performing physician.  

## 2014-07-31 NOTE — Progress Notes (Signed)
A/ox3, pleased with MAC, report to RN 

## 2014-08-01 ENCOUNTER — Telehealth: Payer: Self-pay | Admitting: *Deleted

## 2014-08-01 NOTE — Telephone Encounter (Signed)
  Follow up Call-  Call back number 07/31/2014  Post procedure Call Back phone  # [phone] or [phone]  Permission to leave phone message Yes     Patient questions:  Do you have a fever, pain , or abdominal swelling? No. Pain Score  0 *  Have you tolerated food without any problems? Yes.    Have you been able to return to your normal activities? Yes.    Do you have any questions about your discharge instructions: Diet   No. Medications  No. Follow up visit  No.  Do you have questions or concerns about your Care? No.  Actions: * If pain score is 4 or above: No action needed, pain <4.

## 2014-08-06 ENCOUNTER — Encounter: Payer: Self-pay | Admitting: Internal Medicine

## 2014-08-08 ENCOUNTER — Encounter: Payer: Self-pay | Admitting: Family Medicine

## 2014-09-19 ENCOUNTER — Encounter: Payer: Self-pay | Admitting: Family Medicine

## 2014-10-28 ENCOUNTER — Ambulatory Visit (INDEPENDENT_AMBULATORY_CARE_PROVIDER_SITE_OTHER): Payer: Medicare Other | Admitting: Podiatry

## 2014-10-28 DIAGNOSIS — M79673 Pain in unspecified foot: Secondary | ICD-10-CM

## 2014-10-28 DIAGNOSIS — B351 Tinea unguium: Secondary | ICD-10-CM

## 2014-10-28 NOTE — Progress Notes (Signed)
Subjective:     Patient ID: Candace Long, female   DOB: [birth year], 68 y.o.   MRN: [id number]  HPI patient presents with thick yellow brittle toenails 1-5 both feet that are painful and she cannot cut   Review of Systems     Objective:   Physical Exam Neurovascular status unchanged with thick yellow brittle nailbeds 1-5 both feet    Assessment:     Mycotic nail infection with pain 1-5 both feet    Plan:     Debris painful nailbeds 1-5 both feet with no iatrogenic bleeding noted

## 2015-01-07 ENCOUNTER — Other Ambulatory Visit: Payer: Self-pay | Admitting: Family Medicine

## 2015-01-27 ENCOUNTER — Ambulatory Visit: Payer: Medicare Other

## 2015-02-07 ENCOUNTER — Encounter: Payer: Self-pay | Admitting: Podiatrist

## 2015-02-07 ENCOUNTER — Other Ambulatory Visit: Payer: Self-pay | Admitting: Family Medicine

## 2015-02-07 ENCOUNTER — Ambulatory Visit (INDEPENDENT_AMBULATORY_CARE_PROVIDER_SITE_OTHER): Payer: Medicare Other | Admitting: Podiatrist

## 2015-02-07 DIAGNOSIS — B351 Tinea unguium: Secondary | ICD-10-CM

## 2015-02-07 DIAGNOSIS — M79673 Pain in unspecified foot: Secondary | ICD-10-CM | POA: Diagnosis not present

## 2015-02-07 NOTE — Progress Notes (Signed)
HPI:  Patient presents today for follow up of foot and nail care. Denies any new complaints today.  Objective:  Patients chart is reviewed.  Vascular status reveals pedal pulses noted at 1 out of 4 dp and pt bilateral .  Neurological sensation is intact to [employer] monofilament bilateral.  Patients nails are thickened, discolored, distrophic, friable and brittle with yellow-brown discoloration. Patient subjectively relates they are painful with shoes and with ambulation of bilateral feet.  Assessment:  Symptomatic onychomycosis  Plan:  Discussed treatment options and alternatives.  The symptomatic toenails were debrided through manual an mechanical means without complication.  Return appointment recommended at routine intervals of 3 months

## 2015-03-13 ENCOUNTER — Other Ambulatory Visit: Payer: Self-pay

## 2015-03-13 MED ORDER — ATORVASTATIN CALCIUM 40 MG PO TABS: 40.0000 mg | ORAL_TABLET | Freq: Every day | ORAL | Status: DC

## 2015-03-13 MED ORDER — MINOXIDIL 2.5 MG PO TABS: 2.5000 mg | ORAL_TABLET | Freq: Every day | ORAL | Status: DC

## 2015-03-13 MED ORDER — LISINOPRIL 40 MG PO TABS
40.0000 mg | ORAL_TABLET | Freq: Every day | ORAL | Status: DC
Start: 2015-03-13 — End: 2015-04-23

## 2015-03-13 MED ORDER — SPIRONOLACTONE 25 MG PO TABS: 25.0000 mg | ORAL_TABLET | Freq: Every day | ORAL | Status: DC

## 2015-03-13 MED ORDER — HYDROCHLOROTHIAZIDE 25 MG PO TABS: 25.0000 mg | ORAL_TABLET | Freq: Every day | ORAL | Status: DC

## 2015-03-13 MED ORDER — INSULIN GLARGINE 100 UNIT/ML SOLOSTAR PEN: PEN_INJECTOR | SUBCUTANEOUS | Status: DC

## 2015-03-13 MED ORDER — METFORMIN HCL 1000 MG PO TABS
1000.0000 mg | ORAL_TABLET | Freq: Two times a day (BID) | ORAL | Status: DC
Start: 2015-03-13 — End: 2015-04-23

## 2015-03-13 MED ORDER — AMLODIPINE BESYLATE 10 MG PO TABS: ORAL_TABLET | ORAL | Status: DC

## 2015-03-13 NOTE — Telephone Encounter (Signed)
Juanda Crumble is out of town for 3-5 more days and another family member is taking care of pt in their home and pt is out of med today and request refill of lantus,metformin,amlodipine,atrovastatin,HCTZ,spironolactone,minoxidil and [facility]isinopril [facility] [place]. Advised done.

## 2015-04-13 LAB — HM DIABETES FOOT EXAM

## 2015-04-23 ENCOUNTER — Other Ambulatory Visit: Payer: Self-pay | Admitting: *Deleted

## 2015-04-23 MED ORDER — LISINOPRIL 40 MG PO TABS: 40.0000 mg | ORAL_TABLET | Freq: Every day | ORAL | Status: DC

## 2015-04-23 MED ORDER — SPIRONOLACTONE 25 MG PO TABS: 25.0000 mg | ORAL_TABLET | Freq: Every day | ORAL | Status: DC

## 2015-04-23 MED ORDER — ATORVASTATIN CALCIUM 40 MG PO TABS: 40.0000 mg | ORAL_TABLET | Freq: Every day | ORAL | Status: DC

## 2015-04-23 MED ORDER — HYDROCHLOROTHIAZIDE 25 MG PO TABS: 25.0000 mg | ORAL_TABLET | Freq: Every day | ORAL | Status: DC

## 2015-04-23 MED ORDER — METFORMIN HCL 1000 MG PO TABS: 1000.0000 mg | ORAL_TABLET | Freq: Two times a day (BID) | ORAL | Status: DC

## 2015-04-23 MED ORDER — INSULIN GLARGINE 100 UNIT/ML SOLOSTAR PEN: PEN_INJECTOR | SUBCUTANEOUS | Status: DC

## 2015-04-23 MED ORDER — MINOXIDIL 2.5 MG PO TABS: 2.5000 mg | ORAL_TABLET | Freq: Every day | ORAL | Status: DC

## 2015-04-23 MED ORDER — AMLODIPINE BESYLATE 10 MG PO TABS: ORAL_TABLET | ORAL | Status: DC

## 2015-04-30 ENCOUNTER — Other Ambulatory Visit: Payer: Self-pay | Admitting: Family Medicine

## 2015-05-08 ENCOUNTER — Ambulatory Visit (INDEPENDENT_AMBULATORY_CARE_PROVIDER_SITE_OTHER): Payer: Medicare Other

## 2015-05-08 DIAGNOSIS — B351 Tinea unguium: Secondary | ICD-10-CM

## 2015-05-08 DIAGNOSIS — M79673 Pain in unspecified foot: Secondary | ICD-10-CM

## 2015-05-08 NOTE — Progress Notes (Signed)
Patient ID: Candace Long, female   DOB: 1946/11/25, 69 y.o.   MRN: [id number] Complaint:  Visit Type: Patient returns to my office for continued preventative foot care services. Complaint: Patient states" my nails have grown long and thick and become painful to walk and wear shoes" Patient has been diagnosed with DM with no foot  complications. He presents for preventative foot care services. No changes to ROS  Podiatric Exam: Vascular: dorsalis pedis and posterior tibial pulses are palpable bilateral. Capillary return is immediate. Temperature gradient is WNL. Skin turgor WNL  Sensorium: Normal Semmes Weinstein monofilament test. Normal tactile sensation bilaterally. Nail Exam: Pt has thick disfigured discolored nails with subungual debris noted bilateral entire nail hallux through fifth toenails Ulcer Exam: There is no evidence of ulcer or pre-ulcerative changes or infection. Orthopedic Exam: Muscle tone and strength are WNL. No limitations in general ROM. No crepitus or effusions noted. Foot type and digits show no abnormalities. Bony prominences are unremarkable. Skin: No Porokeratosis. No infection or ulcers  Diagnosis:  Tinea unguium, Pain in right toe, pain in left toes  Treatment & Plan Procedures and Treatment: Consent by patient was obtained for treatment procedures. The patient understood the discussion of treatment and procedures well. All questions were answered thoroughly reviewed. Debridement of mycotic and hypertrophic toenails, 1 through 5 bilateral and clearing of subungual debris. No ulceration, no infection noted.  Return Visit-Office Procedure: Patient instructed to return to the office for a follow up visit 3 months for continued evaluation and treatment.

## 2015-05-14 LAB — HM DIABETES EYE EXAM

## 2015-05-17 ENCOUNTER — Other Ambulatory Visit: Payer: Self-pay | Admitting: Family Medicine

## 2015-05-20 DIAGNOSIS — H25813 Combined forms of age-related cataract, bilateral: Secondary | ICD-10-CM | POA: Diagnosis not present

## 2015-05-20 DIAGNOSIS — H04123 Dry eye syndrome of bilateral lacrimal glands: Secondary | ICD-10-CM | POA: Diagnosis not present

## 2015-05-20 DIAGNOSIS — E119 Type 2 diabetes mellitus without complications: Secondary | ICD-10-CM | POA: Diagnosis not present

## 2015-05-27 ENCOUNTER — Ambulatory Visit (INDEPENDENT_AMBULATORY_CARE_PROVIDER_SITE_OTHER): Payer: Medicare Other | Admitting: Family Medicine

## 2015-05-27 ENCOUNTER — Telehealth: Payer: Self-pay | Admitting: *Deleted

## 2015-05-27 ENCOUNTER — Encounter: Payer: Self-pay | Admitting: Family Medicine

## 2015-05-27 VITALS — BP 136/90 | HR 56 | Temp 98.3°F | Ht 69.0 in | Wt 245.8 lb

## 2015-05-27 DIAGNOSIS — N183 Chronic kidney disease, stage 3 unspecified: Secondary | ICD-10-CM

## 2015-05-27 DIAGNOSIS — Z0000 Encounter for general adult medical examination without abnormal findings: Secondary | ICD-10-CM | POA: Diagnosis not present

## 2015-05-27 DIAGNOSIS — R413 Other amnesia: Secondary | ICD-10-CM | POA: Diagnosis not present

## 2015-05-27 DIAGNOSIS — E1129 Type 2 diabetes mellitus with other diabetic kidney complication: Secondary | ICD-10-CM | POA: Diagnosis not present

## 2015-05-27 DIAGNOSIS — E1165 Type 2 diabetes mellitus with hyperglycemia: Secondary | ICD-10-CM | POA: Diagnosis not present

## 2015-05-27 DIAGNOSIS — IMO0002 Reserved for concepts with insufficient information to code with codable children: Secondary | ICD-10-CM

## 2015-05-27 DIAGNOSIS — Z7189 Other specified counseling: Secondary | ICD-10-CM | POA: Diagnosis not present

## 2015-05-27 DIAGNOSIS — Z23 Encounter for immunization: Secondary | ICD-10-CM | POA: Diagnosis not present

## 2015-05-27 DIAGNOSIS — E2839 Other primary ovarian failure: Secondary | ICD-10-CM | POA: Diagnosis not present

## 2015-05-27 DIAGNOSIS — Z6835 Body mass index (BMI) 35.0-35.9, adult: Secondary | ICD-10-CM

## 2015-05-27 DIAGNOSIS — E1121 Type 2 diabetes mellitus with diabetic nephropathy: Secondary | ICD-10-CM

## 2015-05-27 DIAGNOSIS — I10 Essential (primary) hypertension: Secondary | ICD-10-CM

## 2015-05-27 DIAGNOSIS — E785 Hyperlipidemia, unspecified: Secondary | ICD-10-CM | POA: Diagnosis not present

## 2015-05-27 DIAGNOSIS — E876 Hypokalemia: Secondary | ICD-10-CM

## 2015-05-27 LAB — CBC WITH DIFFERENTIAL/PLATELET
BASOS ABS: 0 10*3/uL (ref 0.0–0.1)
Basophils Relative: 0.5 % (ref 0.0–3.0)
Eosinophils Absolute: 0.2 10*3/uL (ref 0.0–0.7)
Eosinophils Relative: 2.3 % (ref 0.0–5.0)
HEMATOCRIT: 35.8 % — AB (ref 36.0–46.0)
Hemoglobin: 11.7 g/dL — ABNORMAL LOW (ref 12.0–15.0)
LYMPHS ABS: 1.5 10*3/uL (ref 0.7–4.0)
Lymphocytes Relative: 19.1 % (ref 12.0–46.0)
MCHC: 32.7 g/dL (ref 30.0–36.0)
MCV: 85.7 fl (ref 78.0–100.0)
MONOS PCT: 7.1 % (ref 3.0–12.0)
Monocytes Absolute: 0.5 10*3/uL (ref 0.1–1.0)
Neutro Abs: 5.4 10*3/uL (ref 1.4–7.7)
Neutrophils Relative %: 71 % (ref 43.0–77.0)
Platelets: 601 10*3/uL — ABNORMAL HIGH (ref 150.0–400.0)
RBC: 4.18 Mil/uL (ref 3.87–5.11)
RDW: 14.9 % (ref 11.5–15.5)
WBC: 7.7 10*3/uL (ref 4.0–10.5)

## 2015-05-27 LAB — COMPREHENSIVE METABOLIC PANEL
ALK PHOS: 128 U/L — AB (ref 39–117)
ALT: 18 U/L (ref 0–35)
AST: 21 U/L (ref 0–37)
Albumin: 4.2 g/dL (ref 3.5–5.2)
BUN: 12 mg/dL (ref 6–23)
CO2: 38 mEq/L — ABNORMAL HIGH (ref 19–32)
CREATININE: 1.04 mg/dL (ref 0.40–1.20)
Calcium: 9.4 mg/dL (ref 8.4–10.5)
Chloride: 89 mEq/L — ABNORMAL LOW (ref 96–112)
GFR: 68.03 mL/min (ref >60.00)
Glucose, Bld: 125 mg/dL — ABNORMAL HIGH (ref 70–99)
POTASSIUM: 2.8 meq/L — AB (ref 3.5–5.1)
Sodium: 134 mEq/L — ABNORMAL LOW (ref 135–145)
Total Bilirubin: 0.8 mg/dL (ref 0.2–1.2)
Total Protein: 7.4 g/dL (ref 6.0–8.3)

## 2015-05-27 LAB — LIPID PANEL
CHOL/HDL RATIO: 4
Cholesterol: 144 mg/dL (ref 0–200)
HDL: 36.6 mg/dL — ABNORMAL LOW (ref >39.00)
LDL CALC: 84 mg/dL (ref 0–99)
NONHDL: 107.4
Triglycerides: 118 mg/dL (ref 0.0–149.0)
VLDL: 23.6 mg/dL (ref 0.0–40.0)

## 2015-05-27 LAB — VITAMIN B12: Vitamin B-12: 223 pg/mL (ref 211–911)

## 2015-05-27 LAB — TSH: TSH: 1.14 u[IU]/mL (ref 0.35–4.50)

## 2015-05-27 LAB — HEMOGLOBIN A1C: HEMOGLOBIN A1C: 7.1 % — AB (ref 4.6–6.5)

## 2015-05-27 MED ORDER — POTASSIUM CHLORIDE CRYS ER 20 MEQ PO TBCR: 20.0000 meq | EXTENDED_RELEASE_TABLET | Freq: Every day | ORAL | Status: DC

## 2015-05-27 NOTE — Telephone Encounter (Signed)
Left detailed message on voicemail and asked patient to call in to confirm receipt and understanding of message and schedule repeat lab.

## 2015-05-27 NOTE — Progress Notes (Signed)
Pre visit review using our clinic review tool, if applicable. No additional management support is needed unless otherwise documented below in the visit note. 

## 2015-05-27 NOTE — Assessment & Plan Note (Signed)
Weight loss noted, discussed. Encouraged incorporating regular exercise in regimen.

## 2015-05-27 NOTE — Patient Instructions (Addendum)
Pneumovax today. labwork today Check sugars once a week. Goal fasting blood sugar is 80-120.  Start vitamin D 1000 units over the counter. Check if we're taking minoxidil or spironolactone at home and let us know. Schedule appointment with audiologist.  We will see if we can get bone density scan scheduled with mammogram - stop by Marion's office.  Advanced directive packet provided today. Call your insurance about the shingles shot to see if it is covered or how much it would cost and where is cheaper (here or pharmacy).  If you want to receive here, call for nurse visit.   I want you to start walking for exercise a little bit each day.

## 2015-05-27 NOTE — Assessment & Plan Note (Signed)
Recheck today. Continue to monitor. May need to titrate off metformin.

## 2015-05-27 NOTE — Assessment & Plan Note (Signed)
Chronic, check FLP. Continue lipitor.

## 2015-05-27 NOTE — Assessment & Plan Note (Signed)
Advanced directive - Candace Long is [name]. Has not set up. Packet provided today.

## 2015-05-27 NOTE — Progress Notes (Signed)
BP 136/90 mmHg  Pulse 56  Temp(Src) 98.3 F (36.8 C) (Oral)  Ht 5\' 9"  (1.753 m)  Wt 245 lb 12 oz (111.471 kg)  BMI 36.27 kg/m2   CC: medicare wellness visit  Subjective:    Patient ID: Candace Long, female    DOB: December 29, 1945, 69 y.o.   MRN: [id number]  HPI: Candace Long is a 69 y.o. female presenting on 05/27/2015 for Annual Exam   She did not take AM meds this morning.   Memory deficits found last AMW - never returned for formal memory assessment. 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 Short and long term memory trouble ongoing.   DM - poor control, never returned for f/u. Brings log of fasting sugars showing 160-300s, most 200s. Reports compliance with metformin 1000mg  bid, lantus 30u daily. Actually this past year sugars have been decreasing, appetite slowing down, 10lb weight loss. Foot exam in 04/2015 by podiatrist Lab Results  Component Value Date   HGBA1C 11.3* 05/24/2014     HTN - complaint with amlodipine 10mg , hctz 25mg , lisinopril 40mg , and unclear if on minoxidil or spironolactone 25mg  daily. HLD - complaint with lipitor 40mg  daily without myalgias.  Not currently taking vit D.  Failed hearing screen. Worked in [employer].  Eye exam at eye center Denies depression, falls or anhedonia.  Preventative:  H/o disability after cerebral aneurysm repaired. Mammogram 02/2014 WNL, next appt scheduled. Dexa - would like to schedule. COLONOSCOPY Date: 07/2014 1 tubular adenoma, severe diverticulosis, rpt 5 yrs Henrene Pastor) Well woman - no recent pelvic exam. Again daughter and patient request deferred today, discussed did not reschedule last time. Flu [facility] 02/2014, pneumovax today Shingles - not done  Advanced directive - Chauncey Reading is son Juanda Crumble. Has not set up. Packet provided today.  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 Interim medical history since our last visit reviewed. Allergies and medications reviewed and updated. Current Outpatient Prescriptions on File Prior to Visit  Medication Sig  . amLODipine (NORVASC) 10 MG tablet TAKE 1 TABLET (10 MG TOTAL) BY MOUTH DAILY.  Marland Kitchen atorvastatin (LIPITOR) 40 MG tablet Take 1 tablet (40 mg total) by mouth daily.  . hydrochlorothiazide (HYDRODIURIL) 25 MG tablet TAKE 1 TABLET (25 MG TOTAL) BY MOUTH DAILY.  Marland Kitchen Insulin Glargine (LANTUS SOLOSTAR) 100 UNIT/ML Solostar Pen INJECT 30 UNITS INTO THE SKIN DAILY.  Marland Kitchen lisinopril (PRINIVIL,ZESTRIL) 40 MG tablet Take 1 tablet (40 mg total) by mouth daily.  . metFORMIN (GLUCOPHAGE) 1000 MG tablet Take 1 tablet (1,000 mg total) by mouth 2 (two) times daily with a meal.  . minoxidil (LONITEN) 2.5 MG tablet Take 1 tablet (2.5 mg total) by mouth daily.  . Cholecalciferol (VITAMIN D) 1000 UNITS capsule Take 1 capsule (1,000 Units total) by mouth daily. (Patient not taking: Reported on 05/27/2015)  . spironolactone (ALDACTONE) 25 MG tablet Take 1 tablet (25 mg total) by mouth daily.   No current facility-administered medications on file prior to visit.    Review of Systems Per HPI unless specifically indicated above     Objective:    BP 136/90 mmHg  Pulse 56  Temp(Src) 98.3 F (36.8 C) (Oral)  Ht 5\' 9"  (1.753 m)  Wt  245 lb 12 oz (111.471 kg)  BMI 36.27 kg/m2  Wt Readings from Last 3 Encounters:  05/27/15 245 lb 12 oz (111.471 kg)  07/31/14 255 lb (115.667 kg)  07/19/14 257 lb 9.6 oz (116.847 kg)    Physical Exam  Constitutional: She is oriented to person, place, and time. She appears well-developed and well-nourished. No distress.  HENT:  Head: Normocephalic and atraumatic.  Right Ear: Hearing, tympanic membrane, external ear and ear canal  normal.  Left Ear: Hearing, tympanic membrane, external ear and ear canal normal.  Nose: Nose normal.  Mouth/Throat: Uvula is midline, oropharynx is clear and moist and mucous membranes are normal. No oropharyngeal exudate, posterior oropharyngeal edema or posterior oropharyngeal erythema.  Eyes: Conjunctivae and EOM are normal. Pupils are equal, round, and reactive to light. No scleral icterus.  Neck: Normal range of motion. Neck supple. Carotid bruit is not present. No thyromegaly present.  Cardiovascular: Normal rate, regular rhythm, normal heart sounds and intact distal pulses.   No murmur heard. Pulses:      Radial pulses are 2+ on the right side, and 2+ on the left side.  Pulmonary/Chest: Effort normal and breath sounds normal. No respiratory distress. She has no wheezes. She has no rales.  Breast - deferred  Abdominal: Soft. Bowel sounds are normal. She exhibits no distension and no mass. There is no tenderness. There is no rebound and no guarding.  Genitourinary:  GYN - deferred  Musculoskeletal: Normal range of motion. She exhibits no edema.  Lymphadenopathy:    She has no cervical adenopathy.  Neurological: She is alert and oriented to person, place, and time.  CN grossly intact, station and gait intact  Skin: Skin is warm and dry. No rash noted.  Psychiatric: She has a normal mood and affect. Her behavior is normal. Judgment and thought content normal.  Nursing note and vitals reviewed.  Results for orders placed or performed in visit on 05/27/15  HM DIABETES EYE EXAM  Result Value Ref Range   HM Diabetic Eye Exam No Retinopathy No Retinopathy  HM DIABETES FOOT EXAM  Result Value Ref Range   HM Diabetic Foot Exam podiatrist        Assessment & Plan:   Problem List Items Addressed This Visit    Advanced care planning/counseling discussion    Advanced directive - Chauncey Reading is son Juanda Crumble. Has not set up. Packet provided today.      Chronic kidney disease, stage III  (moderate)    Recheck today. Continue to monitor. May need to titrate off metformin.      Relevant Orders   Comprehensive metabolic panel   CBC with Differential/Platelet   HLD (hyperlipidemia)    Chronic, check FLP. Continue lipitor.       Relevant Orders   Lipid panel   TSH   Hypertension    Chronic. Unclear what meds she's on (whether spironolactone or minoxidil). DIL will check at home and call us with update.      Medicare annual wellness visit, subsequent - Primary    I have personally reviewed the Medicare Annual Wellness questionnaire and have noted 1. The patient's medical and social history 2. Their use of alcohol, tobacco or illicit drugs 3. Their current medications and supplements 4. The patient's functional ability including ADL's, fall risks, home safety risks and hearing or visual impairment. 5. Diet and physical activity 6. Evidence for depression or mood disorders The patients weight, height, BMI have been recorded in the chart.  Hearing and  vision has been addressed. I have made referrals, counseling and provided education to the patient based review of the above and I have provided the pt with a written personalized care plan for preventive services. Provider list updated - see scanned questionairre. Reviewed preventative protocols and updated unless pt declined.       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 Continue to monitor.      Relevant Orders   Vitamin B12   Severe obesity (BMI 35.0-39.9) with comorbidity    Weight loss noted, discussed. Encouraged incorporating regular exercise in regimen.      Uncontrolled type 2 diabetes mellitus with nephropathy    Chronic, uncontrolled. Improved sugars based on very limited log she brings today. Check A1c today.      Relevant Orders   Comprehensive metabolic panel   Hemoglobin A1c    Other Visit Diagnoses    Estrogen deficiency        Relevant Orders    DG  Bone Density        Follow up plan: Return in about 3 months (around 08/27/2015), or as needed, for follow up visit.

## 2015-05-27 NOTE — Addendum Note (Signed)
Addended by: Royann Shivers A on: 05/27/2015 10:11 AM   Modules accepted: Orders

## 2015-05-27 NOTE — Assessment & Plan Note (Signed)
Chronic issue since aneurysm surgery 1991, on disability since. Continue to monitor.

## 2015-05-27 NOTE — Telephone Encounter (Signed)
Critical lab called from [facility] lab potassium was 2.8. Result printed and given to MD.

## 2015-05-27 NOTE — Assessment & Plan Note (Signed)
Chronic, uncontrolled. Improved sugars based on very limited log she brings today. Check A1c today.

## 2015-05-27 NOTE — Assessment & Plan Note (Signed)

## 2015-05-27 NOTE — Telephone Encounter (Signed)
plz notify potassium returned very low - I'd like her to start 74mEq potassium daily for the next week then return to recheck potassium level ensure improving.

## 2015-05-27 NOTE — Assessment & Plan Note (Addendum)
Chronic. Unclear what meds she's on (whether spironolactone or minoxidil). DIL will check at home and call us with update.

## 2015-05-28 ENCOUNTER — Telehealth: Payer: Self-pay | Admitting: Family Medicine

## 2015-05-28 DIAGNOSIS — D473 Essential (hemorrhagic) thrombocythemia: Secondary | ICD-10-CM

## 2015-05-28 DIAGNOSIS — D75839 Thrombocytosis, unspecified: Secondary | ICD-10-CM

## 2015-05-28 MED ORDER — INSULIN GLARGINE 100 UNIT/ML SOLOSTAR PEN: PEN_INJECTOR | SUBCUTANEOUS | Status: DC

## 2015-05-28 MED ORDER — VITAMIN B-12 1000 MCG PO TABS
1000.0000 ug | ORAL_TABLET | Freq: Every day | ORAL | Status: AC
Start: 2015-05-28 — End: ?

## 2015-05-28 MED ORDER — HYDROCHLOROTHIAZIDE 25 MG PO TABS: ORAL_TABLET | ORAL | Status: DC

## 2015-05-28 MED ORDER — ATORVASTATIN CALCIUM 40 MG PO TABS: 40.0000 mg | ORAL_TABLET | Freq: Every day | ORAL | Status: DC

## 2015-05-28 MED ORDER — METFORMIN HCL 1000 MG PO TABS: 1000.0000 mg | ORAL_TABLET | Freq: Two times a day (BID) | ORAL | Status: DC

## 2015-05-28 MED ORDER — AMLODIPINE BESYLATE 10 MG PO TABS: ORAL_TABLET | ORAL | Status: DC

## 2015-05-28 MED ORDER — LISINOPRIL 40 MG PO TABS: 40.0000 mg | ORAL_TABLET | Freq: Every day | ORAL | Status: DC

## 2015-05-28 NOTE — Telephone Encounter (Signed)
Noted. Med list updated. ----> I have her on lisinopril 40mg  daily - is she not taking this?  Let's stop monoxodil. Continue all other meds. Refilled to [facility] (including lisinopril 40). Recommend 1000 IU vit D daily. Also recommend start vitamin B12 1000 mcg daily OTC.

## 2015-05-28 NOTE — Telephone Encounter (Signed)
Spoke with patient's son and he verbalized understanding. Will start K+ and lab appt scheduled.

## 2015-05-28 NOTE — Telephone Encounter (Signed)
Left message asking pt to call office  Please see if she has had a bone density done before is so where did she go If she has not has one she if she wants to go to [place] or [place] Any date or time??

## 2015-05-28 NOTE — Telephone Encounter (Signed)
Metformin 1000 mg 1 tab twice a day hctz 25mg  1 daily monoxodil 2.5mg  1 tab daily  Amlodipine 10 mg 1 tab daily  Atorvastatin 40mg  1 tab daily  Insulin 30u daily Vit d otc (will be purchasing)  Can you recommend a strength for the vit d?

## 2015-05-29 NOTE — Telephone Encounter (Signed)
Spoke Candace Long  Pt never had bone density [facility] 1st available

## 2015-05-29 NOTE — Telephone Encounter (Signed)
Message left for patient's son to return my call.

## 2015-05-29 NOTE — Telephone Encounter (Signed)
Appointment 6/20 at [facility] Pt son Juanda Crumble aware of appointment

## 2015-06-02 ENCOUNTER — Ambulatory Visit: Payer: Medicare Other | Attending: Family Medicine

## 2015-06-03 ENCOUNTER — Other Ambulatory Visit: Payer: Self-pay | Admitting: *Deleted

## 2015-06-03 ENCOUNTER — Encounter: Payer: Self-pay | Admitting: *Deleted

## 2015-06-03 MED ORDER — HYDROCHLOROTHIAZIDE 25 MG PO TABS: ORAL_TABLET | ORAL | Status: DC

## 2015-06-03 MED ORDER — LISINOPRIL 40 MG PO TABS: 40.0000 mg | ORAL_TABLET | Freq: Every day | ORAL | Status: DC

## 2015-06-03 MED ORDER — METFORMIN HCL 1000 MG PO TABS: 1000.0000 mg | ORAL_TABLET | Freq: Two times a day (BID) | ORAL | Status: DC

## 2015-06-03 MED ORDER — ATORVASTATIN CALCIUM 40 MG PO TABS: 40.0000 mg | ORAL_TABLET | Freq: Every day | ORAL | Status: DC

## 2015-06-03 MED ORDER — INSULIN GLARGINE 100 UNIT/ML SOLOSTAR PEN: PEN_INJECTOR | SUBCUTANEOUS | Status: DC

## 2015-06-03 MED ORDER — AMLODIPINE BESYLATE 10 MG PO TABS: ORAL_TABLET | ORAL | Status: DC

## 2015-06-03 NOTE — Telephone Encounter (Signed)
Spoke with patient's son. He said she is taking lisinopril. He will have her stop monoxodil and start b12 and vit D. Patient doesn't use [facility] and he didn't know how that pharmacy got on her list of pharmacies. Cancelled refills and sent to [facility].

## 2015-06-06 ENCOUNTER — Other Ambulatory Visit (INDEPENDENT_AMBULATORY_CARE_PROVIDER_SITE_OTHER): Payer: Medicare Other

## 2015-06-06 ENCOUNTER — Telehealth: Payer: Self-pay | Admitting: *Deleted

## 2015-06-06 DIAGNOSIS — E876 Hypokalemia: Secondary | ICD-10-CM | POA: Diagnosis not present

## 2015-06-06 DIAGNOSIS — E875 Hyperkalemia: Secondary | ICD-10-CM

## 2015-06-06 DIAGNOSIS — D75839 Thrombocytosis, unspecified: Secondary | ICD-10-CM

## 2015-06-06 DIAGNOSIS — D473 Essential (hemorrhagic) thrombocythemia: Secondary | ICD-10-CM

## 2015-06-06 LAB — POTASSIUM: Potassium: 2.7 mEq/L — CL (ref 3.5–5.1)

## 2015-06-06 NOTE — Telephone Encounter (Signed)
plz try to call one more time.  Take potassium 21mEq twice daily from now on, return on Monday for recheck. Also recommend decreasing hydrochlorothiazide to 1/2 tablet daily.

## 2015-06-06 NOTE — Telephone Encounter (Signed)
Son advised of instructions and repeated them correctly.  Lab appt scheduled.

## 2015-06-06 NOTE — Telephone Encounter (Signed)
plz notify I want her to take potassium 2 tablets daily (or 1 tab BID) and recheck on Monday. Ordered.

## 2015-06-06 NOTE — Telephone Encounter (Signed)
[facility] lab called with critical Potassium 2.7. Dr. Danise Mina notified.

## 2015-06-06 NOTE — Telephone Encounter (Signed)
Called and had to leave a message for patient's son. I'm not sure if you want to try to reach him again later to ensure he receives the message or not. I will call back on Monday if you didn't make contact.

## 2015-06-09 ENCOUNTER — Other Ambulatory Visit (INDEPENDENT_AMBULATORY_CARE_PROVIDER_SITE_OTHER): Payer: Medicare Other

## 2015-06-09 ENCOUNTER — Other Ambulatory Visit: Payer: Self-pay | Admitting: Family Medicine

## 2015-06-09 DIAGNOSIS — E876 Hypokalemia: Secondary | ICD-10-CM

## 2015-06-09 DIAGNOSIS — E875 Hyperkalemia: Secondary | ICD-10-CM | POA: Diagnosis not present

## 2015-06-09 LAB — POTASSIUM: POTASSIUM: 3.1 meq/L — AB (ref 3.5–5.1)

## 2015-06-09 LAB — PATHOLOGIST SMEAR REVIEW

## 2015-06-09 MED ORDER — POTASSIUM CHLORIDE CRYS ER 20 MEQ PO TBCR: 20.0000 meq | EXTENDED_RELEASE_TABLET | Freq: Two times a day (BID) | ORAL | Status: DC

## 2015-06-10 ENCOUNTER — Encounter: Payer: Self-pay | Admitting: Family Medicine

## 2015-06-10 DIAGNOSIS — D473 Essential (hemorrhagic) thrombocythemia: Secondary | ICD-10-CM | POA: Insufficient documentation

## 2015-06-10 DIAGNOSIS — D75839 Thrombocytosis, unspecified: Secondary | ICD-10-CM | POA: Insufficient documentation

## 2015-06-13 ENCOUNTER — Encounter: Payer: Self-pay | Admitting: *Deleted

## 2015-06-26 ENCOUNTER — Other Ambulatory Visit (INDEPENDENT_AMBULATORY_CARE_PROVIDER_SITE_OTHER): Payer: Medicare Other

## 2015-06-26 DIAGNOSIS — E876 Hypokalemia: Secondary | ICD-10-CM

## 2015-06-26 LAB — POTASSIUM: Potassium: 3 mEq/L — ABNORMAL LOW (ref 3.5–5.1)

## 2015-06-28 ENCOUNTER — Other Ambulatory Visit: Payer: Self-pay | Admitting: Family Medicine

## 2015-06-28 DIAGNOSIS — E876 Hypokalemia: Secondary | ICD-10-CM

## 2015-07-15 ENCOUNTER — Encounter: Payer: Self-pay | Admitting: *Deleted

## 2015-07-15 ENCOUNTER — Other Ambulatory Visit (INDEPENDENT_AMBULATORY_CARE_PROVIDER_SITE_OTHER): Payer: Medicare Other

## 2015-07-15 DIAGNOSIS — E876 Hypokalemia: Secondary | ICD-10-CM

## 2015-07-15 LAB — POTASSIUM: POTASSIUM: 3.8 meq/L (ref 3.5–5.1)

## 2015-08-14 ENCOUNTER — Ambulatory Visit: Payer: Medicare Other | Admitting: Podiatry

## 2015-08-26 ENCOUNTER — Encounter: Payer: Self-pay | Admitting: Podiatry

## 2015-08-26 ENCOUNTER — Ambulatory Visit (INDEPENDENT_AMBULATORY_CARE_PROVIDER_SITE_OTHER): Payer: Medicare Other | Admitting: Podiatry

## 2015-08-26 DIAGNOSIS — B351 Tinea unguium: Secondary | ICD-10-CM

## 2015-08-26 DIAGNOSIS — M79673 Pain in unspecified foot: Secondary | ICD-10-CM

## 2015-08-26 NOTE — Progress Notes (Signed)
Patient ID: Candace Long, female   DOB: 10/20/1946, 68 y.o.   MRN: [id number] Complaint:  Visit Type: Patient returns to my office for continued preventative foot care services. Complaint: Patient states" my nails have grown long and thick and become painful to walk and wear shoes" Patient has been diagnosed with DM with no foot  complications. He presents for preventative foot care services. No changes to ROS  Podiatric Exam: Vascular: dorsalis pedis and posterior tibial pulses are palpable bilateral. Capillary return is immediate. Temperature gradient is WNL. Skin turgor WNL  Sensorium: Normal Semmes Weinstein monofilament test. Normal tactile sensation bilaterally. Nail Exam: Pt has thick disfigured discolored nails with subungual debris noted bilateral entire nail hallux through fifth toenails Ulcer Exam: There is no evidence of ulcer or pre-ulcerative changes or infection. Orthopedic Exam: Muscle tone and strength are WNL. No limitations in general ROM. No crepitus or effusions noted. Foot type and digits show no abnormalities. Bony prominences are unremarkable. Skin: No Porokeratosis. No infection or ulcers  Diagnosis:  Tinea unguium, Pain in right toe, pain in left toes  Treatment & Plan Procedures and Treatment: Consent by patient was obtained for treatment procedures. The patient understood the discussion of treatment and procedures well. All questions were answered thoroughly reviewed. Debridement of mycotic and hypertrophic toenails, 1 through 5 bilateral and clearing of subungual debris. No ulceration, no infection noted.  Return Visit-Office Procedure: Patient instructed to return to the office for a follow up visit 3 months for continued evaluation and treatment. 

## 2015-08-28 ENCOUNTER — Ambulatory Visit: Payer: Medicare Other | Admitting: Family Medicine

## 2015-08-28 LAB — HM DIABETES FOOT EXAM

## 2015-08-29 ENCOUNTER — Telehealth: Payer: Self-pay | Admitting: Family Medicine

## 2015-08-29 NOTE — Telephone Encounter (Signed)
Pt did not come in for their appt yesterday for 3 month follow up. Please let me know if pt needs to be contacted immediately for follow up or no follow up needed. Best phone number to contact pt is [phone].

## 2015-08-30 ENCOUNTER — Other Ambulatory Visit: Payer: Self-pay | Admitting: Family Medicine

## 2015-08-30 NOTE — Telephone Encounter (Signed)
Yes plz schedule f/u visit - schedule with son.

## 2015-09-04 ENCOUNTER — Ambulatory Visit (INDEPENDENT_AMBULATORY_CARE_PROVIDER_SITE_OTHER): Payer: Medicare Other | Admitting: Family Medicine

## 2015-09-04 ENCOUNTER — Other Ambulatory Visit: Payer: Self-pay

## 2015-09-04 ENCOUNTER — Encounter: Payer: Self-pay | Admitting: Family Medicine

## 2015-09-04 VITALS — BP 138/72 | HR 85 | Temp 98.1°F | Wt 251.0 lb

## 2015-09-04 DIAGNOSIS — E1129 Type 2 diabetes mellitus with other diabetic kidney complication: Secondary | ICD-10-CM

## 2015-09-04 DIAGNOSIS — E559 Vitamin D deficiency, unspecified: Secondary | ICD-10-CM | POA: Diagnosis not present

## 2015-09-04 DIAGNOSIS — I10 Essential (primary) hypertension: Secondary | ICD-10-CM

## 2015-09-04 DIAGNOSIS — E785 Hyperlipidemia, unspecified: Secondary | ICD-10-CM | POA: Diagnosis not present

## 2015-09-04 DIAGNOSIS — E538 Deficiency of other specified B group vitamins: Secondary | ICD-10-CM | POA: Diagnosis not present

## 2015-09-04 DIAGNOSIS — N183 Chronic kidney disease, stage 3 unspecified: Secondary | ICD-10-CM

## 2015-09-04 DIAGNOSIS — R0600 Dyspnea, unspecified: Secondary | ICD-10-CM | POA: Insufficient documentation

## 2015-09-04 DIAGNOSIS — Z23 Encounter for immunization: Secondary | ICD-10-CM | POA: Diagnosis not present

## 2015-09-04 DIAGNOSIS — H9190 Unspecified hearing loss, unspecified ear: Secondary | ICD-10-CM

## 2015-09-04 DIAGNOSIS — E1165 Type 2 diabetes mellitus with hyperglycemia: Secondary | ICD-10-CM

## 2015-09-04 DIAGNOSIS — E876 Hypokalemia: Secondary | ICD-10-CM | POA: Diagnosis not present

## 2015-09-04 DIAGNOSIS — E1121 Type 2 diabetes mellitus with diabetic nephropathy: Secondary | ICD-10-CM

## 2015-09-04 DIAGNOSIS — IMO0002 Reserved for concepts with insufficient information to code with codable children: Secondary | ICD-10-CM

## 2015-09-04 MED ORDER — ALBUTEROL SULFATE HFA 108 (90 BASE) MCG/ACT IN AERS: 2.0000 | INHALATION_SPRAY | Freq: Four times a day (QID) | RESPIRATORY_TRACT | Status: DC | PRN

## 2015-09-04 NOTE — Assessment & Plan Note (Signed)
Compliant with 1000 mcg daily. Recheck today.

## 2015-09-04 NOTE — Progress Notes (Signed)
BP 138/72 mmHg  Pulse 85  Temp(Src) 98.1 F (36.7 C) (Oral)  Wt 251 lb (113.853 kg)  SpO2 98%   CC: 11mo f/u visit  Subjective:    Patient ID: Candace Long, female    DOB: June 21, 1946, 69 y.o.   MRN: [id number]  HPI: Candace Long is a 69 y.o. female presenting on 09/04/2015 for Follow-up   Presents with son. Requests audiology referral. Flu shot today.   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 Ongoing short and long term memory trouble.   Mild dyspnea - no known h/o asthma. Ex smoker. Some dyspnea on exertion noted by son, some wheezing noted by son. No fevers or significant cough  DM - does not regularly check sugars: fasting 125.  Compliant with antihyperglycemic regimen which includes: metformin 1000mg  bid and lantus 30u daily. Denies low sugars or hypoglycemic symptoms.  Mild paresthesias. Last diabetic eye exam 05/2015.  Pneumovax: 05/2015.  Prevnar: 02/2014. Lab Results  Component Value Date   HGBA1C 7.1* 05/27/2015   Diabetic Foot Exam - Simple   No data filed       HTN - Compliant with current antihypertensive regimen of amlodipine 10mg , hctz 12.5mg  daily, lisinopril 40mg  daily. ran out of potassium and has not been taking. Does not check blood pressures at home. No low blood pressure readings or symptoms of dizziness/syncope. Denies HA, vision changes, CP/tightness, SOB, leg swelling.    HLD - compliant with lipitor 40mg  daily without myalgias.  Relevant past medical, surgical, family and social history reviewed and updated as indicated. Interim medical history since our last visit reviewed. Allergies and medications reviewed and updated. Current Outpatient Prescriptions on File Prior to Visit  Medication Sig  . amLODipine (NORVASC) 10 MG tablet TAKE 1 TABLET (10 MG TOTAL) BY MOUTH DAILY.  Marland Kitchen atorvastatin (LIPITOR) 40 MG tablet Take 1 tablet (40 mg total) by mouth daily.  . Cholecalciferol (VITAMIN D) 1000 UNITS  capsule Take 1 capsule (1,000 Units total) by mouth daily.  . hydrochlorothiazide (HYDRODIURIL) 25 MG tablet Take 0.5 tablets (12.5 mg total) by mouth daily.  . Insulin Glargine (LANTUS SOLOSTAR) 100 UNIT/ML Solostar Pen INJECT 30 UNITS INTO THE SKIN DAILY.  Marland Kitchen lisinopril (PRINIVIL,ZESTRIL) 40 MG tablet Take 1 tablet (40 mg total) by mouth daily.  . metFORMIN (GLUCOPHAGE) 1000 MG tablet Take 1 tablet (1,000 mg total) by mouth 2 (two) times daily with a meal.  . potassium chloride SA (K-DUR,KLOR-CON) 20 MEQ tablet Take 1 tablet (20 mEq total) by mouth 2 (two) times daily.  . vitamin B-12 (CYANOCOBALAMIN) 1000 MCG tablet Take 1 tablet (1,000 mcg total) by mouth daily.   No current facility-administered medications on file prior to visit.    Review of Systems Per HPI unless specifically indicated above     Objective:    BP 138/72 mmHg  Pulse 85  Temp(Src) 98.1 F (36.7 C) (Oral)  Wt 251 lb (113.853 kg)  SpO2 98%  Wt Readings from Last 3 Encounters:  09/04/15 251 lb (113.853 kg)  05/27/15 245 lb 12 oz (111.471 kg)  07/31/14 255 lb (115.667 kg)    Physical Exam  Constitutional: She appears well-developed and well-nourished. No distress.  HENT:  Head: Normocephalic and atraumatic.  Right Ear: External ear normal.  Left Ear: External ear normal.  Nose: Nose normal.  Mouth/Throat: Oropharynx is clear and moist. No oropharyngeal exudate.  Eyes: Conjunctivae and EOM are normal. Pupils are equal, round, and reactive to  light. No scleral icterus.  Neck: Normal range of motion. Neck supple.  Cardiovascular: Normal rate, regular rhythm, normal heart sounds and intact distal pulses.   No murmur heard. Pulmonary/Chest: Effort normal. No respiratory distress. She has wheezes (mild polysymphonic exp wheezing through). She has no rales.  Musculoskeletal: She exhibits no edema.  See HPI for foot exam if done  Lymphadenopathy:    She has no cervical adenopathy.  Skin: Skin is warm and dry. No  rash noted.  Psychiatric: She has a normal mood and affect.  Nursing note and vitals reviewed.  Results for orders placed or performed in visit on 09/04/15  HM DIABETES FOOT EXAM  Result Value Ref Range   HM Diabetic Foot Exam podiatrist       Assessment & Plan:  rec start aspirin 81mg  MWF. Problem List Items Addressed This Visit    Vitamin D deficiency    Compliant with 1000 IU daily. recheck today.      Relevant Orders   Vit D  25 hydroxy (rtn osteoporosis monitoring)   Vitamin B12 deficiency    Compliant with 1000 mcg daily. Recheck today.      Relevant Orders   Vitamin B12   Uncontrolled type 2 diabetes mellitus with nephropathy    Chronic, improved. Continue current regimen. Recent eye and foot exam.      Relevant Medications   aspirin EC 81 MG tablet   Hypertension - Primary    Off spironolactone and minoxidil. Complaint with hctz 12.5mg  daily, amlodipine 10mg  daily, and lisinopril 40mg  daily. Recheck K and Cr today. In h/o hypokalemia, check RAS system today to eval for primary hyperaldosteronism as cause of HTN.      Relevant Medications   aspirin EC 81 MG tablet   Other Relevant Orders   Basic metabolic panel   Aldosterone + renin activity w/ ratio   HLD (hyperlipidemia)    Chronic, stable. Continue current regimen      Relevant Medications   aspirin EC 81 MG tablet   Dyspnea    Dyspnea and wheezing noted today - discussed with pt and son. No h/o asthma, but polysymphonic wheezing indicative of this. She is a smoker - will treat with albuterol rescue inhaler for now, and check spirometry next visit. Update if worsening despite albuterol for further eval/treatment.      Chronic kidney disease, stage III (moderate)    Stable on recent check. Lab Results  Component Value Date   CREATININE 1.04 05/27/2015          Other Visit Diagnoses    Hypokalemia        Relevant Orders    Basic metabolic panel    Aldosterone + renin activity w/ ratio    Need  for prophylactic vaccination and inoculation against influenza        Relevant Orders    Flu Vaccine QUAD 36+ mos IM (Completed)    Hearing loss, unspecified laterality        Relevant Orders    Ambulatory referral to Audiology        Follow up plan: Return in about 3 months (around 12/04/2015), or as needed, for follow up visit.

## 2015-09-04 NOTE — Patient Instructions (Addendum)
Labwork today. Flu shot today. We will refer you to audiology - expect a call in next few weeks.  Start aspirin 81mg  on Monday Wednesday and Friday. Return in 3 months for spirometry. In the meantime, may use albuterol inhaler - rescue inhaler - 2 puffs as needed for shortness of breath or wheezing.  Nice to see you today, call us with questions.

## 2015-09-04 NOTE — Progress Notes (Signed)
Pre visit review using our clinic review tool, if applicable. No additional management support is needed unless otherwise documented below in the visit note. 

## 2015-09-04 NOTE — Assessment & Plan Note (Signed)
Dyspnea and wheezing noted today - discussed with pt and son. No h/o asthma, but polysymphonic wheezing indicative of this. She is a smoker - will treat with albuterol rescue inhaler for now, and check spirometry next visit. Update if worsening despite albuterol for further eval/treatment.

## 2015-09-04 NOTE — Assessment & Plan Note (Signed)
Compliant with 1000 IU daily. recheck today.

## 2015-09-04 NOTE — Assessment & Plan Note (Signed)
Chronic, stable. Continue current regimen. 

## 2015-09-04 NOTE — Assessment & Plan Note (Signed)
Off spironolactone and minoxidil. Complaint with hctz 12.5mg  daily, amlodipine 10mg  daily, and lisinopril 40mg  daily. Recheck K and Cr today. In h/o hypokalemia, check RAS system today to eval for primary hyperaldosteronism as cause of HTN.

## 2015-09-04 NOTE — Assessment & Plan Note (Signed)
Stable on recent check. Lab Results  Component Value Date   CREATININE 1.04 05/27/2015

## 2015-09-04 NOTE — Assessment & Plan Note (Signed)
Chronic, improved. Continue current regimen. Recent eye and foot exam.

## 2015-09-05 ENCOUNTER — Telehealth: Payer: Self-pay | Admitting: Radiology

## 2015-09-05 DIAGNOSIS — IMO0002 Reserved for concepts with insufficient information to code with codable children: Secondary | ICD-10-CM

## 2015-09-05 DIAGNOSIS — E876 Hypokalemia: Secondary | ICD-10-CM

## 2015-09-05 DIAGNOSIS — I10 Essential (primary) hypertension: Secondary | ICD-10-CM

## 2015-09-05 DIAGNOSIS — E1165 Type 2 diabetes mellitus with hyperglycemia: Secondary | ICD-10-CM

## 2015-09-05 LAB — BASIC METABOLIC PANEL
BUN: 17 mg/dL (ref 6–23)
CALCIUM: 9.2 mg/dL (ref 8.4–10.5)
CO2: 31 mEq/L (ref 19–32)
CREATININE: 1.1 mg/dL (ref 0.40–1.20)
Chloride: 89 mEq/L — ABNORMAL LOW (ref 96–112)
GFR: 63.71 mL/min (ref >60.00)
Glucose, Bld: 79 mg/dL (ref 70–99)
Potassium: 3.4 mEq/L — ABNORMAL LOW (ref 3.5–5.1)
SODIUM: 130 meq/L — AB (ref 135–145)

## 2015-09-05 LAB — VITAMIN D 25 HYDROXY (VIT D DEFICIENCY, FRACTURES): VITD: 35.8 ng/mL (ref 30.00–100.00)

## 2015-09-05 LAB — ALDOSTERONE + RENIN ACTIVITY W/ RATIO

## 2015-09-05 LAB — VITAMIN B12: VITAMIN B 12: 392 pg/mL (ref 211–911)

## 2015-09-05 NOTE — Telephone Encounter (Signed)
[facility] lab called, pts samples for Renin and Aldosterone were not rec'd frozen. Needs to be redrawn. LMOM for pt to call back.

## 2015-09-07 ENCOUNTER — Other Ambulatory Visit: Payer: Self-pay | Admitting: Family Medicine

## 2015-09-07 MED ORDER — POTASSIUM CHLORIDE CRYS ER 20 MEQ PO TBCR: 20.0000 meq | EXTENDED_RELEASE_TABLET | Freq: Every day | ORAL | Status: DC

## 2015-09-08 NOTE — Telephone Encounter (Signed)
Pt's son Juanda Crumble returned the phone call, he will bring her in [place] 5/29 @ 4:30.

## 2015-09-11 ENCOUNTER — Other Ambulatory Visit (INDEPENDENT_AMBULATORY_CARE_PROVIDER_SITE_OTHER): Payer: Medicare Other

## 2015-09-11 DIAGNOSIS — E1165 Type 2 diabetes mellitus with hyperglycemia: Secondary | ICD-10-CM

## 2015-09-11 DIAGNOSIS — I10 Essential (primary) hypertension: Secondary | ICD-10-CM | POA: Diagnosis not present

## 2015-09-11 DIAGNOSIS — E119 Type 2 diabetes mellitus without complications: Secondary | ICD-10-CM | POA: Diagnosis not present

## 2015-09-11 DIAGNOSIS — E876 Hypokalemia: Secondary | ICD-10-CM

## 2015-09-11 DIAGNOSIS — IMO0002 Reserved for concepts with insufficient information to code with codable children: Secondary | ICD-10-CM

## 2015-09-18 LAB — ALDOSTERONE + RENIN ACTIVITY W/ RATIO
ALDO / PRA Ratio: 5.1 Ratio (ref 0.9–28.9)
Aldosterone: 8 ng/dL
PRA LC/MS/MS: 1.56 ng/mL/h (ref 0.25–5.82)

## 2015-09-19 ENCOUNTER — Encounter: Payer: Self-pay | Admitting: *Deleted

## 2015-09-19 DIAGNOSIS — H903 Sensorineural hearing loss, bilateral: Secondary | ICD-10-CM | POA: Diagnosis not present

## 2015-09-19 DIAGNOSIS — E041 Nontoxic single thyroid nodule: Secondary | ICD-10-CM | POA: Diagnosis not present

## 2015-09-22 ENCOUNTER — Other Ambulatory Visit: Payer: Self-pay | Admitting: Otolaryngology

## 2015-09-22 DIAGNOSIS — H9193 Unspecified hearing loss, bilateral: Secondary | ICD-10-CM

## 2015-09-24 ENCOUNTER — Encounter: Payer: Self-pay | Admitting: Family Medicine

## 2015-09-24 ENCOUNTER — Other Ambulatory Visit: Payer: Medicare Other

## 2015-09-24 DIAGNOSIS — E041 Nontoxic single thyroid nodule: Secondary | ICD-10-CM | POA: Insufficient documentation

## 2015-11-25 ENCOUNTER — Ambulatory Visit (INDEPENDENT_AMBULATORY_CARE_PROVIDER_SITE_OTHER): Payer: Medicare Other | Admitting: Podiatry

## 2015-11-25 ENCOUNTER — Encounter: Payer: Self-pay | Admitting: Podiatry

## 2015-11-25 DIAGNOSIS — M79673 Pain in unspecified foot: Secondary | ICD-10-CM

## 2015-11-25 DIAGNOSIS — B351 Tinea unguium: Secondary | ICD-10-CM | POA: Diagnosis not present

## 2015-11-25 NOTE — Progress Notes (Signed)
Patient ID: Candace Long, female   DOB: 07/20/1946, 69 y.o.   MRN: [id number] Complaint:  Visit Type: Patient returns to my office for continued preventative foot care services. Complaint: Patient states" my nails have grown long and thick and become painful to walk and wear shoes" Patient has been diagnosed with DM with no foot  complications. He presents for preventative foot care services. No changes to ROS  Podiatric Exam: Vascular: dorsalis pedis and posterior tibial pulses are palpable bilateral. Capillary return is immediate. Temperature gradient is WNL. Skin turgor WNL  Sensorium: Normal Semmes Weinstein monofilament test. Normal tactile sensation bilaterally. Nail Exam: Pt has thick disfigured discolored nails with subungual debris noted bilateral entire nail hallux through fifth toenails Ulcer Exam: There is no evidence of ulcer or pre-ulcerative changes or infection. Orthopedic Exam: Muscle tone and strength are WNL. No limitations in general ROM. No crepitus or effusions noted. Foot type and digits show no abnormalities. Bony prominences are unremarkable. Skin: No Porokeratosis. No infection or ulcers  Diagnosis:  Tinea unguium, Pain in right toe, pain in left toes  Treatment & Plan Procedures and Treatment: Consent by patient was obtained for treatment procedures. The patient understood the discussion of treatment and procedures well. All questions were answered thoroughly reviewed. Debridement of mycotic and hypertrophic toenails, 1 through 5 bilateral and clearing of subungual debris. No ulceration, no infection noted.  Return Visit-Office Procedure: Patient instructed to return to the office for a follow up visit 3 months for continued evaluation and treatment.  Gardiner Barefoot DPM

## 2015-11-27 ENCOUNTER — Encounter: Payer: Self-pay | Admitting: Family Medicine

## 2015-11-27 ENCOUNTER — Ambulatory Visit (INDEPENDENT_AMBULATORY_CARE_PROVIDER_SITE_OTHER): Payer: Medicare Other | Admitting: Family Medicine

## 2015-11-27 VITALS — BP 132/82 | HR 74 | Temp 97.7°F | Wt 242.1 lb

## 2015-11-27 DIAGNOSIS — I10 Essential (primary) hypertension: Secondary | ICD-10-CM

## 2015-11-27 DIAGNOSIS — N183 Chronic kidney disease, stage 3 unspecified: Secondary | ICD-10-CM

## 2015-11-27 DIAGNOSIS — Z6835 Body mass index (BMI) 35.0-35.9, adult: Secondary | ICD-10-CM

## 2015-11-27 DIAGNOSIS — E1121 Type 2 diabetes mellitus with diabetic nephropathy: Secondary | ICD-10-CM

## 2015-11-27 DIAGNOSIS — Z794 Long term (current) use of insulin: Secondary | ICD-10-CM | POA: Diagnosis not present

## 2015-11-27 NOTE — Patient Instructions (Addendum)
Labs today. Return in 6 months for medicare wellness visit Keep up the good work with the weight!

## 2015-11-27 NOTE — Assessment & Plan Note (Signed)
Recheck levels today

## 2015-11-27 NOTE — Assessment & Plan Note (Signed)
Chronic, stable. Marked improvement noted - will check labs today. RTC 6 mo f/u visit. Foot exam today.

## 2015-11-27 NOTE — Progress Notes (Signed)
Pre visit review using our clinic review tool, if applicable. No additional management support is needed unless otherwise documented below in the visit note. 

## 2015-11-27 NOTE — Assessment & Plan Note (Signed)
Congratulated on weight loss noted. Continue smaller portion sizes

## 2015-11-27 NOTE — Progress Notes (Signed)
BP 132/82 mmHg  Pulse 74  Temp(Src) 97.7 F (36.5 C) (Oral)  Wt 242 lb 1.9 oz (109.825 kg)  SpO2 97%   CC: 3 mo f/u visit  Subjective:    Patient ID: Candace Long, female    DOB: 08-21-1946, 69 y.o.   MRN: [id number]  HPI: Candace Long is a 69 y.o. female presenting on 11/27/2015 for Follow-up   Obesity - last 9 lbs. Smaller portion sizes.   Feels breathing doing better.   DM - does not regularly check sugars: fasting 125. Compliant with antihyperglycemic regimen which includes: metformin 1000mg  bid and lantus 30u daily. Denies low sugars or hypoglycemic symptoms. Mild paresthesias. Last diabetic eye exam 05/2015. Pneumovax: 05/2015. Prevnar: 02/2014. Lab Results  Component Value Date   HGBA1C 7.1* 05/27/2015   Diabetic Foot Exam - Simple   Simple Foot Form  Diabetic Foot exam was performed with the following findings:  Yes 11/27/2015  5:32 PM  Visual Inspection  No deformities, no ulcerations, no other skin breakdown bilaterally:  Yes  Sensation Testing  Intact to touch and monofilament testing bilaterally:  Yes  Pulse Check  Posterior Tibialis and Dorsalis pulse intact bilaterally:  Yes  Comments       HTN - Compliant with current antihypertensive regimen of amlodipine 10mg , hctz 12.5mg  daily, lisinopril 40mg  daily. ran out of potassium and has not been taking. Does not check blood pressures at home. No low blood pressure readings or symptoms of dizziness/syncope. Denies HA, vision changes, CP/tightness, SOB, leg swelling.   Relevant past medical, surgical, family and social history reviewed and updated as indicated. Interim medical history since our last visit reviewed. Allergies and medications reviewed and updated. Current Outpatient Prescriptions on File Prior to Visit  Medication Sig  . albuterol (PROVENTIL HFA;VENTOLIN HFA) 108 (90 BASE) MCG/ACT inhaler Inhale 2 puffs into the lungs every 6 (six) hours as needed for wheezing or shortness of breath.  Marland Kitchen  amLODipine (NORVASC) 10 MG tablet TAKE 1 TABLET (10 MG TOTAL) BY MOUTH DAILY.  Marland Kitchen aspirin EC 81 MG tablet Take 81 mg by mouth daily.   Marland Kitchen atorvastatin (LIPITOR) 40 MG tablet Take 1 tablet (40 mg total) by mouth daily.  . Cholecalciferol (VITAMIN D) 1000 UNITS capsule Take 1 capsule (1,000 Units total) by mouth daily.  . Insulin Glargine (LANTUS SOLOSTAR) 100 UNIT/ML Solostar Pen INJECT 30 UNITS INTO THE SKIN DAILY.  Marland Kitchen lisinopril (PRINIVIL,ZESTRIL) 40 MG tablet Take 1 tablet (40 mg total) by mouth daily.  . metFORMIN (GLUCOPHAGE) 1000 MG tablet Take 1 tablet (1,000 mg total) by mouth 2 (two) times daily with a meal.  . potassium chloride SA (K-DUR,KLOR-CON) 20 MEQ tablet Take 1 tablet (20 mEq total) by mouth daily.  . vitamin B-12 (CYANOCOBALAMIN) 1000 MCG tablet Take 1 tablet (1,000 mcg total) by mouth daily.   No current facility-administered medications on file prior to visit.    Review of Systems Per HPI unless specifically indicated in ROS section     Objective:    BP 132/82 mmHg  Pulse 74  Temp(Src) 97.7 F (36.5 C) (Oral)  Wt 242 lb 1.9 oz (109.825 kg)  SpO2 97%  Wt Readings from Last 3 Encounters:  11/27/15 242 lb 1.9 oz (109.825 kg)  09/04/15 251 lb (113.853 kg)  05/27/15 245 lb 12 oz (111.471 kg)   Body mass index is 35.74 kg/(m^2).  Physical Exam  Constitutional: She appears well-developed and well-nourished. No distress.  HENT:  Head: Normocephalic and atraumatic.  Right Ear:  External ear normal.  Left Ear: External ear normal.  Nose: Nose normal.  Mouth/Throat: Oropharynx is clear and moist. No oropharyngeal exudate.  Eyes: Conjunctivae and EOM are normal. Pupils are equal, round, and reactive to light. No scleral icterus.  Neck: Normal range of motion. Neck supple.  Cardiovascular: Normal rate, regular rhythm, normal heart sounds and intact distal pulses.   No murmur heard. Pulmonary/Chest: Effort normal and breath sounds normal. No respiratory distress. She has  no wheezes. She has no rales.  Musculoskeletal: She exhibits no edema.  See HPI for foot exam if done  Lymphadenopathy:    She has no cervical adenopathy.  Skin: Skin is warm and dry. No rash noted.  Psychiatric: She has a normal mood and affect.  Nursing note and vitals reviewed.  Results for orders placed or performed in visit on 09/11/15  Aldosterone + renin activity w/ ratio  Result Value Ref Range   PRA LC/MS/MS 1.56 0.25 - 5.82 ng/mL/h   ALDO / PRA Ratio 5.1 0.9 - 28.9 Ratio   Aldosterone 8 ng/dL      Assessment & Plan:   Problem List Items Addressed This Visit    Severe obesity (BMI 35.0-39.9) with comorbidity ([place])    Congratulated on weight loss noted. Continue smaller portion sizes      Hypertension    Chronic, stable. Continue current regimen. Check labs today.      Controlled type 2 diabetes mellitus with diabetic nephropathy (HCC) - Primary    Chronic, stable. Marked improvement noted - will check labs today. RTC 6 mo f/u visit. Foot exam today.      Relevant Orders   Hemoglobin 123456   Basic metabolic panel   Chronic kidney disease, stage III (moderate)    Recheck levels today.          Follow up plan: Return in about 6 months (around 05/27/2016), or as needed, for medicare wellness visit.

## 2015-11-27 NOTE — Assessment & Plan Note (Signed)
Chronic, stable. Continue current regimen. Check labs today.  

## 2015-11-28 LAB — BASIC METABOLIC PANEL
BUN: 14 mg/dL (ref 6–23)
CALCIUM: 9.1 mg/dL (ref 8.4–10.5)
CO2: 30 meq/L (ref 19–32)
CREATININE: 1.17 mg/dL (ref 0.40–1.20)
Chloride: 89 mEq/L — ABNORMAL LOW (ref 96–112)
GFR: 59.29 mL/min — AB (ref >60.00)
GLUCOSE: 117 mg/dL — AB (ref 70–99)
Potassium: 3.5 mEq/L (ref 3.5–5.1)
SODIUM: 129 meq/L — AB (ref 135–145)

## 2015-11-28 LAB — HEMOGLOBIN A1C: Hgb A1c MFr Bld: 6.7 % — ABNORMAL HIGH (ref 4.6–6.5)

## 2015-11-30 ENCOUNTER — Other Ambulatory Visit: Payer: Self-pay | Admitting: Family Medicine

## 2015-11-30 DIAGNOSIS — N183 Chronic kidney disease, stage 3 unspecified: Secondary | ICD-10-CM

## 2015-11-30 DIAGNOSIS — E871 Hypo-osmolality and hyponatremia: Secondary | ICD-10-CM

## 2015-12-02 ENCOUNTER — Telehealth: Payer: Self-pay | Admitting: Family Medicine

## 2015-12-02 NOTE — Telephone Encounter (Signed)
Margette Fast returned Brooke's call about patient's lab results.

## 2015-12-02 NOTE — Telephone Encounter (Signed)
Mr. Jannifer Franklin notified of patient's lab results. Thanks!

## 2015-12-24 ENCOUNTER — Telehealth: Payer: Self-pay

## 2015-12-24 ENCOUNTER — Other Ambulatory Visit: Payer: Self-pay | Admitting: Family Medicine

## 2015-12-24 NOTE — Telephone Encounter (Signed)
Pt's son Juanda Crumble brought list of meds from [facility] that said needed ICD10 coding; I spoke with Lonn Georgia pharmacist at [facility] and she said she cannot take ICD10 over phone; I asked her why needed ICD10. Lonn Georgia put me on hold came back to phone and said ICD 10 codes not needed; needs pt current ins card for Part D medicare. Charles voiced understanding.

## 2015-12-27 ENCOUNTER — Telehealth: Payer: Self-pay | Admitting: Family Medicine

## 2015-12-27 ENCOUNTER — Other Ambulatory Visit: Payer: Self-pay | Admitting: Family Medicine

## 2015-12-27 MED ORDER — ALBUTEROL SULFATE HFA 108 (90 BASE) MCG/ACT IN AERS: 2.0000 | INHALATION_SPRAY | Freq: Four times a day (QID) | RESPIRATORY_TRACT | Status: AC | PRN

## 2015-12-27 NOTE — Telephone Encounter (Signed)
Albuterol changed to ventolin per insurance formulary change request

## 2016-02-09 ENCOUNTER — Other Ambulatory Visit: Payer: Self-pay | Admitting: Family Medicine

## 2016-02-24 ENCOUNTER — Ambulatory Visit (INDEPENDENT_AMBULATORY_CARE_PROVIDER_SITE_OTHER): Payer: Medicare Other | Admitting: Podiatry

## 2016-02-24 ENCOUNTER — Encounter: Payer: Self-pay | Admitting: Podiatry

## 2016-02-24 DIAGNOSIS — B351 Tinea unguium: Secondary | ICD-10-CM

## 2016-02-24 DIAGNOSIS — M79673 Pain in unspecified foot: Secondary | ICD-10-CM | POA: Diagnosis not present

## 2016-02-24 NOTE — Progress Notes (Signed)
Patient ID: Candace Long, female   DOB: 07/20/1946, 70 y.o.   MRN: [id number] Complaint:  Visit Type: Patient returns to my office for continued preventative foot care services. Complaint: Patient states" my nails have grown long and thick and become painful to walk and wear shoes" Patient has been diagnosed with DM with no foot  complications. He presents for preventative foot care services. No changes to ROS  Podiatric Exam: Vascular: dorsalis pedis and posterior tibial pulses are palpable bilateral. Capillary return is immediate. Temperature gradient is WNL. Skin turgor WNL  Sensorium: Normal Semmes Weinstein monofilament test. Normal tactile sensation bilaterally. Nail Exam: Pt has thick disfigured discolored nails with subungual debris noted bilateral entire nail hallux through fifth toenails Ulcer Exam: There is no evidence of ulcer or pre-ulcerative changes or infection. Orthopedic Exam: Muscle tone and strength are WNL. No limitations in general ROM. No crepitus or effusions noted. Foot type and digits show no abnormalities. Bony prominences are unremarkable. Skin: No Porokeratosis. No infection or ulcers  Diagnosis:  Tinea unguium, Pain in right toe, pain in left toes  Treatment & Plan Procedures and Treatment: Consent by patient was obtained for treatment procedures. The patient understood the discussion of treatment and procedures well. All questions were answered thoroughly reviewed. Debridement of mycotic and hypertrophic toenails, 1 through 5 bilateral and clearing of subungual debris. No ulceration, no infection noted.  Return Visit-Office Procedure: Patient instructed to return to the office for a follow up visit 3 months for continued evaluation and treatment.  Gardiner Barefoot DPM

## 2016-04-04 ENCOUNTER — Ambulatory Visit (INDEPENDENT_AMBULATORY_CARE_PROVIDER_SITE_OTHER): Payer: Medicare Other | Admitting: Internal Medicine

## 2016-04-04 VITALS — BP 146/86 | HR 79 | Temp 98.2°F | Resp 16 | Ht 69.0 in | Wt 240.0 lb

## 2016-04-04 DIAGNOSIS — N183 Chronic kidney disease, stage 3 unspecified: Secondary | ICD-10-CM

## 2016-04-04 DIAGNOSIS — E1121 Type 2 diabetes mellitus with diabetic nephropathy: Secondary | ICD-10-CM | POA: Diagnosis not present

## 2016-04-04 DIAGNOSIS — E871 Hypo-osmolality and hyponatremia: Secondary | ICD-10-CM | POA: Diagnosis not present

## 2016-04-04 DIAGNOSIS — R350 Frequency of micturition: Secondary | ICD-10-CM | POA: Diagnosis not present

## 2016-04-04 DIAGNOSIS — R531 Weakness: Secondary | ICD-10-CM | POA: Diagnosis not present

## 2016-04-04 DIAGNOSIS — Z794 Long term (current) use of insulin: Secondary | ICD-10-CM | POA: Diagnosis not present

## 2016-04-04 DIAGNOSIS — R42 Dizziness and giddiness: Secondary | ICD-10-CM | POA: Diagnosis not present

## 2016-04-04 DIAGNOSIS — E876 Hypokalemia: Secondary | ICD-10-CM

## 2016-04-04 DIAGNOSIS — I10 Essential (primary) hypertension: Secondary | ICD-10-CM | POA: Diagnosis not present

## 2016-04-04 LAB — POCT URINALYSIS DIP (MANUAL ENTRY)
Bilirubin, UA: NEGATIVE
Blood, UA: NEGATIVE
Glucose, UA: NEGATIVE
Ketones, POC UA: NEGATIVE
LEUKOCYTES UA: NEGATIVE
NITRITE UA: NEGATIVE
PH UA: 8.5
Spec Grav, UA: 1.015
UROBILINOGEN UA: 0.2

## 2016-04-04 LAB — POCT CBC
GRANULOCYTE PERCENT: 74.7 % (ref 37–80)
HEMATOCRIT: 35.3 % — AB (ref 37.7–47.9)
Hemoglobin: 11.9 g/dL — AB (ref 12.2–16.2)
Lymph, poc: 1.7 (ref 0.6–3.4)
MCH: 28.9 pg (ref 27–31.2)
MCHC: 33.7 g/dL (ref 31.8–35.4)
MCV: 85.9 fL (ref 80–97)
MID (CBC): 0.4 (ref 0–0.9)
MPV: 6.5 fL (ref 0–99.8)
PLATELET COUNT, POC: 517 10*3/uL — AB (ref 142–424)
POC GRANULOCYTE: 6 (ref 2–6.9)
POC LYMPH %: 20.7 % (ref 10–50)
POC MID %: 4.6 %M (ref 0–12)
RBC: 4.12 M/uL (ref 4.04–5.48)
RDW, POC: 14.1 %
WBC: 8 10*3/uL (ref 4.6–10.2)

## 2016-04-04 LAB — GLUCOSE, POCT (MANUAL RESULT ENTRY): POC Glucose: 108 mg/dl — AB (ref 70–99)

## 2016-04-04 LAB — COMPREHENSIVE METABOLIC PANEL
ALK PHOS: 122 U/L (ref 33–130)
ALT: 14 U/L (ref 6–29)
AST: 16 U/L (ref 10–35)
Albumin: 3.9 g/dL (ref 3.6–5.1)
BILIRUBIN TOTAL: 0.7 mg/dL (ref 0.2–1.2)
BUN: 10 mg/dL (ref 7–25)
CO2: 30 mmol/L (ref 20–31)
CREATININE: 1 mg/dL — AB (ref 0.50–0.99)
Calcium: 9.2 mg/dL (ref 8.6–10.4)
Chloride: 95 mmol/L — ABNORMAL LOW (ref 98–110)
GLUCOSE: 98 mg/dL (ref 65–99)
POTASSIUM: 3.6 mmol/L (ref 3.5–5.3)
SODIUM: 136 mmol/L (ref 135–146)
TOTAL PROTEIN: 7.1 g/dL (ref 6.1–8.1)

## 2016-04-04 LAB — OSMOLALITY: OSMOLALITY: 285 mosm/kg (ref 275–300)

## 2016-04-04 LAB — POC MICROSCOPIC URINALYSIS (UMFC): Mucus: ABSENT

## 2016-04-04 LAB — OSMOLALITY, URINE: OSMOLALITY UR: 290 mosm/kg — AB (ref 390–1090)

## 2016-04-04 MED ORDER — ONDANSETRON HCL 8 MG PO TABS: 8.0000 mg | ORAL_TABLET | Freq: Three times a day (TID) | ORAL | Status: DC | PRN

## 2016-04-04 NOTE — Patient Instructions (Signed)
     IF you received an x-ray today, you will receive an invoice from [facility]. Please contact [facility] at [phone] with questions or concerns regarding your invoice.   IF you received labwork today, you will receive an invoice from [facility]. Please contact [facility] at [phone] with questions or concerns regarding your invoice.   Our billing staff will not be able to assist you with questions regarding bills from these companies.  You will be contacted with the lab results as soon as they are available. The fastest way to get your results is to activate your My Chart account. Instructions are located on the last page of this paperwork. If you have not heard from us regarding the results in 2 weeks, please contact this office.      

## 2016-04-04 NOTE — Progress Notes (Signed)
Subjective:  By signing my name below, I, Moises Blood, attest that this documentation has been prepared under the direction and in the presence of Tami Lin, MD. Electronically Signed: Moises Blood, [place]. 04/04/2016 , 11:14 AM .  Patient was seen in Room 14 .She is here with her son who has healthcare power of attorney This is her first urgent medical and family care visit   Patient ID: Candace Long, female    DOB: Oct 01, 1946, 70 y.o.   MRN: [id number] Chief Complaint  Patient presents with  . Nausea  . Emesis  . Abdominal Pain  . Fatigue  . Dizziness   HPI Candace Long is a 70 y.o. female who presents to [facility] complaining of nausea with dizziness and fatigue that started 3 days ago. She reports initially feeling dizzy 3 mornings ago and "threw up spit". She has some loss of appetite without abdominal pain now which was present day 1. She has hx of frequent mild constipation (usually 2-3 days before she has a BM). She feels a little nauseated sitting in the room but denies any dizziness. She denies fever, palpitations, or chest pain. No dysuria but has had frequency.  Her blood sugars have been well controlled recently  She had a stress test done 3 years ago and it was reported normal.  Her sugar was running 108 yesterday.   Patient Active Problem List   Diagnosis Date Noted  . Thyroid nodule 09/24/2015  . Vitamin D deficiency 09/04/2015  . Vitamin B12 deficiency 09/04/2015  . Dyspnea 09/04/2015  . Reactive thrombocytosis 06/10/2015  . Memory deficits--since cerebral aneurysm rupture 1993  05/24/2014  . Chronic kidney disease, stage III (moderate) 05/24/2014  . Hypertension   . Controlled type 2 diabetes mellitus with diabetic nephropathy ([place])   . HLD (hyperlipidemia)   . Severe obesity (BMI 35.0-39.9) with comorbidity ([place])    At office visit 11/27/2015 with her PCP Dr. Danise Mina, her lab work revealed hyponatremia. She has not been back for repeat testing or  further workup. She is also had several episodes of hypokalemia.. Aldosterone and renin studies were normal last September. Hemoglobin A1c has been normal.  Current outpatient prescriptions:  .  albuterol (VENTOLIN HFA) 108 (90 Base) MCG/ACT inhaler, Inhale 2 puffs into the lungs every 6 (six) hours as needed for wheezing or shortness of breath., Disp: 1 Inhaler, Rfl: 3 .  amLODipine (NORVASC) 10 MG tablet, TAKE 1 TABLET (10 MG TOTAL) BY MOUTH DAILY., Disp: 90 tablet, Rfl: 3 .  aspirin EC 81 MG tablet, Take 81 mg by mouth daily. , Disp: , Rfl:  .  atorvastatin (LIPITOR) 40 MG tablet, Take 1 tablet (40 mg total) by mouth daily., Disp: 90 tablet, Rfl: 3 .  LANTUS SOLOSTAR 100 UNIT/ML Solostar Pen, INJECT 30 UNITS INTO THE SKIN DAILY., Disp: 15 pen, Rfl: 6 .  metFORMIN (GLUCOPHAGE) 1000 MG tablet, TAKE 1 TABLET (1,000 MG TOTAL) BY MOUTH 2 (TWO) TIMES DAILY WITH A MEAL., Disp: 180 tablet, Rfl: 2 .  potassium chloride SA (K-DUR,KLOR-CON) 20 MEQ tablet, Take 1 tablet (20 mEq total) by mouth daily., Disp: 30 tablet, Rfl: 6 .  vitamin B-12 (CYANOCOBALAMIN) 1000 MCG tablet, Take 1 tablet (1,000 mcg total) by mouth daily., Disp: , Rfl:  .  Cholecalciferol (VITAMIN D) 1000 UNITS capsule, Take 1 capsule (1,000 Units total) by mouth daily. (Patient not taking: Reported on 04/04/2016), Disp: , Rfl:  .  lisinopril (PRINIVIL,ZESTRIL) 40 MG tablet, Take 1 tablet (40 mg total) by mouth  daily. (Patient not taking: Reported on 04/04/2016), Disp: 90 tablet, Rfl: 3 Allergies  Allergen Reactions  . Hydrochlorothiazide Other (See Comments)    Hypokalemia, hyponatremia    Review of Systems  Constitutional: Positive for activity change, appetite change and fatigue. Negative for fever, chills and unexpected weight change.  HENT: Negative for dental problem, mouth sores and trouble swallowing.   Eyes: Negative for photophobia and visual disturbance.  Respiratory: Negative for cough, chest tightness, shortness of breath and  wheezing.   Cardiovascular: Negative for chest pain, palpitations and leg swelling.  Gastrointestinal: Positive for nausea, vomiting, abdominal pain and constipation. Negative for diarrhea, blood in stool and anal bleeding.  Endocrine: Negative for polydipsia.  Genitourinary: Positive for frequency. Negative for dysuria, hematuria and pelvic pain.  Musculoskeletal: Negative for joint swelling.  Neurological: Positive for dizziness. Negative for weakness, numbness and headaches.  Psychiatric/Behavioral:       She has a history of cognitive problems stable since cerebral aneurysm rupture 1993       Objective:   Physical Exam  Constitutional: She is oriented to person, place, and time. She appears well-developed and well-nourished. No distress.  HENT:  Head: Normocephalic and atraumatic.  Right Ear: External ear normal.  Left Ear: External ear normal.  Nose: Nose normal.  Mouth/Throat: Oropharynx is clear and moist. No oropharyngeal exudate.  Eyes: Conjunctivae and EOM are normal. Pupils are equal, round, and reactive to light.  Neck: Normal range of motion. Neck supple. No thyromegaly present.  Cardiovascular: Normal rate, regular rhythm and normal heart sounds.   No murmur heard. Pulmonary/Chest: Effort normal and breath sounds normal. No respiratory distress. She has no wheezes.  Abdominal: She exhibits no distension. There is no tenderness.  Musculoskeletal: Normal range of motion. She exhibits no edema.  Lymphadenopathy:    She has no cervical adenopathy.  Neurological: She is alert and oriented to person, place, and time. She has normal reflexes. No cranial nerve deficit. She exhibits normal muscle tone. Coordination normal.   cannot precipitate her dizziness with position changes or rapid eye movement She is able to lie down and get up without being dizzy. Her gait is not affected. Reflexes are symmetrical. There are no sensory or motor losses identified.   Skin: Skin is warm  and dry.  Psychiatric: She has a normal mood and affect. Her behavior is normal.  Nursing note and vitals reviewed.   BP 146/86 mmHg  Pulse 79  Temp(Src) 98.2 F (36.8 C)  Resp 16  Ht 5\' 9"  (1.753 m)  Wt 240 lb (108.863 kg)  BMI 35.43 kg/m2   Wt Readings from Last 3 Encounters:  04/04/16 240 lb (108.863 kg)  11/27/15 242 lb 1.9 oz (109.825 kg)  09/04/15 251 lb (113.853 kg)   Results for orders placed or performed in visit on 04/04/16  POCT CBC  Result Value Ref Range   WBC 8.0 4.6 - 10.2 K/uL   Lymph, poc 1.7 0.6 - 3.4   POC LYMPH PERCENT 20.7 10 - 50 %L   MID (cbc) 0.4 0 - 0.9   POC MID % 4.6 0 - 12 %M   POC Granulocyte 6.0 2 - 6.9   Granulocyte percent 74.7 37 - 80 %G   RBC 4.12 4.04 - 5.48 M/uL   Hemoglobin 11.9 (A) 12.2 - 16.2 g/dL   HCT, POC 35.3 (A) 37.7 - 47.9 %   MCV 85.9 80 - 97 fL   MCH, POC 28.9 27 - 31.2 pg   MCHC  33.7 31.8 - 35.4 g/dL   RDW, POC 14.1 %   Platelet Count, POC 517 (A) 142 - 424 K/uL   MPV 6.5 0 - 99.8 fL  POCT Microscopic Urinalysis ([facility])  Result Value Ref Range   WBC,UR,HPF,POC None None WBC/hpf   RBC,UR,HPF,POC None None RBC/hpf   Bacteria Few (A) None, Too numerous to count   Mucus Absent Absent   Epithelial Cells, UR Per Microscopy Few (A) None, Too numerous to count cells/hpf  POCT urinalysis dipstick  Result Value Ref Range   Color, UA yellow yellow   Clarity, UA clear clear   Glucose, UA negative negative   Bilirubin, UA negative negative   Ketones, POC UA negative negative   Spec Grav, UA 1.015    Blood, UA negative negative   pH, UA 8.5    Protein Ur, POC >=300 (A) negative   Urobilinogen, UA 0.2    Nitrite, UA Negative Negative   Leukocytes, UA Negative Negative  POCT glucose (manual entry)  Result Value Ref Range   POC Glucose 108 (A) 70 - 99 mg/dl       Assessment & Plan:  I have completed the patient encounter in its entirety as documented by the scribe, with editing by me where necessary. Darriel Sinquefield P. Laney Pastor,  M.D.   Dizzy - Plan: Comprehensive metabolic panel,  Weak - Comprehensive metabolic panel  Hyponatremia - Plan: Comprehensive metabolic panel, Osmolality, Osmolality, urine  Hypokalemia - Plan: Comprehensive metabolic panel  Urinary frequency - Plan: POCT Microscopic Urinalysis ([facility]), POCT urinalysis dipstick  Controlled type 2 diabetes mellitus with diabetic nephropathy, with long-term current use of insulin (HCC)  Chronic kidney disease, stage III (moderate)--- creatinine stable past 2 years  Essential hypertension,uncontrolled--- history of difficult to control  Mild anemia stable  This appears to be more than benign positional vertigo and I believe a metabolic origin is possible so we will pursue further testing

## 2016-04-05 ENCOUNTER — Telehealth: Payer: Self-pay | Admitting: *Deleted

## 2016-04-05 NOTE — Telephone Encounter (Signed)
Noted  

## 2016-04-05 NOTE — Telephone Encounter (Signed)
Patient was seen at [facility] on 04/04/16.

## 2016-04-05 NOTE — Telephone Encounter (Signed)
PLEASE NOTE: All timestamps contained within this report are represented as [place] Standard Time. CONFIDENTIALTY NOTICE: This fax transmission is intended only for the addressee. It contains information that is legally privileged, confidential or otherwise protected from use or disclosure. If you are not the intended recipient, you are strictly prohibited from reviewing, disclosing, copying using or disseminating any of this information or taking any action in reliance on or regarding this information. If you have received this fax in error, please notify us immediately by telephone so that we can arrange for its return to [place]. Phone: [phone], Toll-Free: [phone], Fax: [phone] Page: 1 of 2 Call Id: [id number] [place] Patient Name: Candace Long Gender: Female DOB: [birth year] Age: 70 Y 48 M 2 D Return Phone Number: [phone] (Primary), [phone] (Secondary) Address: City/State/Zip: [place] [facility] Physician Ria Bush Contact Type Call Who Is Calling Patient / Member / Family / Caregiver Call Type Triage / Clinical Caller Name Margette Fast Relationship To Patient Son Return Phone Number [phone] (Primary) Chief Complaint Dizziness Reason for Call Symptomatic / Request for Health Information Initial Comment Caller states mother has been dizzy and nauseous for past 3 days. BG 109 and BP 152/104 this a.m. On BP med. Appointment Disposition EMR Appointment Not Necessary Info pasted into Epic No PreDisposition Go to Urgent Care/Walk-In Clinic Translation No Nurse Assessment Nurse: Christel Mormon, RN, Levada Dy Date/Time (Eastern Time): 04/03/2016 12:05:17 PM Confirm and document reason for call. If symptomatic, describe symptoms. You must click the next button to save  text entered. ---Caller states mother has been dizzy and nauseous for past 3 days. BS 109 and BP 152/104 this a.m. This only happens first thing in the mornings and feels fine in the evening. She had not taken her BP med before he left for an errand. Has the patient traveled out of the country within the last 30 days? ---No Does the patient have any new or worsening symptoms? ---Yes Will a triage be completed? ---Yes Related visit to physician within the last 2 weeks? ---No Does the PT have any chronic conditions? (i.e. diabetes, asthma, etc.) ---Yes List chronic conditions. ---HTN, diabetes, aneurysm in 1992 or 1993, VP shunt, Is this a behavioral health or substance abuse call? ---No Guidelines Guideline Title Affirmed Question Affirmed Notes Nurse Date/Time (Eastern Time) Dizziness - Lightheadedness [1] MODERATE dizziness (e.g., interferes with normal activities) AND [2] has NOT been [place], Therapist, sports, Levada Dy 04/03/2016 12:08:04 PM PLEASE NOTE: All timestamps contained within this report are represented as [place] Standard Time. CONFIDENTIALTY NOTICE: This fax transmission is intended only for the addressee. It contains information that is legally privileged, confidential or otherwise protected from use or disclosure. If you are not the intended recipient, you are strictly prohibited from reviewing, disclosing, copying using or disseminating any of this information or taking any action in reliance on or regarding this information. If you have received this fax in error, please notify us immediately by telephone so that we can arrange for its return to [place]. Phone: [phone], Toll-Free: [phone], Fax: [phone] Page: 2 of 2 Call Id: [id number] Guidelines Guideline Title Affirmed Question Affirmed Notes Nurse Date/Time Eilene Ghazi Time) evaluated by physician for this (Exception: dizziness caused by heat exposure, sudden standing, or poor fluid intake) Disp. Time Eilene Ghazi Time)  Disposition Final User 04/03/2016 11:44:10 AM Attempt made - message left [place], Therapist, sports,  Levada Dy 04/03/2016 11:45:05 AM Attempt made - message left Felipa Emory 04/03/2016 12:11:55 PM See Physician within 24 Hours Yes Christel Mormon, RN, Marin Shutter Understands: Yes Disagree/Comply: Comply Care Advice Given Per Guideline SEE PHYSICIAN WITHIN 24 HOURS: FLUIDS: Drink several glasses of fruit juice, other clear fluids or water. This will improve hydration and blood glucose. If the weather is hot, make sure the fluids are cold. REST: Lie down with feet elevated for 1 hour. This will improve circulation and increase blood flow to the brain. CALL BACK IF: * You become worse. * Passes out (faints) CARE ADVICE given per Dizziness (Adult) guideline. [facility]errals [facility]

## 2016-04-24 ENCOUNTER — Other Ambulatory Visit: Payer: Self-pay | Admitting: Family Medicine

## 2016-05-13 LAB — HM DIABETES EYE EXAM

## 2016-05-21 DIAGNOSIS — E119 Type 2 diabetes mellitus without complications: Secondary | ICD-10-CM | POA: Diagnosis not present

## 2016-05-21 DIAGNOSIS — H5202 Hypermetropia, left eye: Secondary | ICD-10-CM | POA: Diagnosis not present

## 2016-05-21 DIAGNOSIS — H52221 Regular astigmatism, right eye: Secondary | ICD-10-CM | POA: Diagnosis not present

## 2016-05-21 DIAGNOSIS — H25813 Combined forms of age-related cataract, bilateral: Secondary | ICD-10-CM | POA: Diagnosis not present

## 2016-05-21 DIAGNOSIS — H524 Presbyopia: Secondary | ICD-10-CM | POA: Diagnosis not present

## 2016-05-21 DIAGNOSIS — H04123 Dry eye syndrome of bilateral lacrimal glands: Secondary | ICD-10-CM | POA: Diagnosis not present

## 2016-05-30 ENCOUNTER — Other Ambulatory Visit: Payer: Self-pay | Admitting: Family Medicine

## 2016-05-30 DIAGNOSIS — I10 Essential (primary) hypertension: Secondary | ICD-10-CM

## 2016-05-30 DIAGNOSIS — N183 Chronic kidney disease, stage 3 unspecified: Secondary | ICD-10-CM

## 2016-05-30 DIAGNOSIS — E1121 Type 2 diabetes mellitus with diabetic nephropathy: Secondary | ICD-10-CM

## 2016-05-30 DIAGNOSIS — E559 Vitamin D deficiency, unspecified: Secondary | ICD-10-CM

## 2016-05-30 DIAGNOSIS — R7989 Other specified abnormal findings of blood chemistry: Secondary | ICD-10-CM

## 2016-05-30 DIAGNOSIS — E785 Hyperlipidemia, unspecified: Secondary | ICD-10-CM

## 2016-05-30 DIAGNOSIS — E041 Nontoxic single thyroid nodule: Secondary | ICD-10-CM

## 2016-05-30 DIAGNOSIS — Z794 Long term (current) use of insulin: Secondary | ICD-10-CM

## 2016-05-30 DIAGNOSIS — E538 Deficiency of other specified B group vitamins: Secondary | ICD-10-CM

## 2016-05-30 DIAGNOSIS — Z1159 Encounter for screening for other viral diseases: Secondary | ICD-10-CM

## 2016-05-30 DIAGNOSIS — D75838 Other thrombocytosis: Secondary | ICD-10-CM

## 2016-05-31 ENCOUNTER — Other Ambulatory Visit (INDEPENDENT_AMBULATORY_CARE_PROVIDER_SITE_OTHER): Payer: Medicare Other

## 2016-05-31 DIAGNOSIS — Z1159 Encounter for screening for other viral diseases: Secondary | ICD-10-CM

## 2016-05-31 DIAGNOSIS — Z794 Long term (current) use of insulin: Secondary | ICD-10-CM | POA: Diagnosis not present

## 2016-05-31 DIAGNOSIS — D75838 Other thrombocytosis: Secondary | ICD-10-CM

## 2016-05-31 DIAGNOSIS — E1121 Type 2 diabetes mellitus with diabetic nephropathy: Secondary | ICD-10-CM

## 2016-05-31 DIAGNOSIS — R7989 Other specified abnormal findings of blood chemistry: Secondary | ICD-10-CM

## 2016-05-31 DIAGNOSIS — E041 Nontoxic single thyroid nodule: Secondary | ICD-10-CM | POA: Diagnosis not present

## 2016-05-31 DIAGNOSIS — E785 Hyperlipidemia, unspecified: Secondary | ICD-10-CM | POA: Diagnosis not present

## 2016-05-31 DIAGNOSIS — E538 Deficiency of other specified B group vitamins: Secondary | ICD-10-CM

## 2016-05-31 DIAGNOSIS — N183 Chronic kidney disease, stage 3 unspecified: Secondary | ICD-10-CM

## 2016-05-31 DIAGNOSIS — E559 Vitamin D deficiency, unspecified: Secondary | ICD-10-CM

## 2016-06-01 ENCOUNTER — Encounter: Payer: Self-pay | Admitting: Podiatry

## 2016-06-01 ENCOUNTER — Ambulatory Visit (INDEPENDENT_AMBULATORY_CARE_PROVIDER_SITE_OTHER): Payer: Medicare Other | Admitting: Podiatry

## 2016-06-01 DIAGNOSIS — B351 Tinea unguium: Secondary | ICD-10-CM

## 2016-06-01 DIAGNOSIS — M79673 Pain in unspecified foot: Secondary | ICD-10-CM | POA: Diagnosis not present

## 2016-06-01 LAB — BASIC METABOLIC PANEL
BUN: 16 mg/dL (ref 6–23)
CHLORIDE: 90 meq/L — AB (ref 96–112)
CO2: 33 meq/L — AB (ref 19–32)
CREATININE: 1.27 mg/dL — AB (ref 0.40–1.20)
Calcium: 9.6 mg/dL (ref 8.4–10.5)
GFR: 53.86 mL/min — ABNORMAL LOW (ref >60.00)
Glucose, Bld: 98 mg/dL (ref 70–99)
POTASSIUM: 3.1 meq/L — AB (ref 3.5–5.1)
SODIUM: 132 meq/L — AB (ref 135–145)

## 2016-06-01 LAB — LIPID PANEL
CHOLESTEROL: 149 mg/dL (ref 0–200)
HDL: 40.2 mg/dL (ref >39.00)
LDL Cholesterol: 72 mg/dL (ref 0–99)
NONHDL: 108.53
Total CHOL/HDL Ratio: 4
Triglycerides: 184 mg/dL — ABNORMAL HIGH (ref 0.0–149.0)
VLDL: 36.8 mg/dL (ref 0.0–40.0)

## 2016-06-01 LAB — HEMOGLOBIN A1C: HEMOGLOBIN A1C: 6.6 % — AB (ref 4.6–6.5)

## 2016-06-01 LAB — CBC WITH DIFFERENTIAL/PLATELET
BASOS ABS: 0.1 10*3/uL (ref 0.0–0.1)
Basophils Relative: 0.6 % (ref 0.0–3.0)
EOS PCT: 1.5 % (ref 0.0–5.0)
Eosinophils Absolute: 0.2 10*3/uL (ref 0.0–0.7)
HEMATOCRIT: 35 % — AB (ref 36.0–46.0)
HEMOGLOBIN: 11.4 g/dL — AB (ref 12.0–15.0)
LYMPHS ABS: 2.1 10*3/uL (ref 0.7–4.0)
LYMPHS PCT: 20.5 % (ref 12.0–46.0)
MCHC: 32.4 g/dL (ref 30.0–36.0)
MCV: 87.1 fl (ref 78.0–100.0)
MONOS PCT: 6.7 % (ref 3.0–12.0)
Monocytes Absolute: 0.7 10*3/uL (ref 0.1–1.0)
Neutro Abs: 7.1 10*3/uL (ref 1.4–7.7)
Neutrophils Relative %: 70.7 % (ref 43.0–77.0)
Platelets: 554 10*3/uL — ABNORMAL HIGH (ref 150.0–400.0)
RBC: 4.02 Mil/uL (ref 3.87–5.11)
RDW: 14 % (ref 11.5–15.5)
WBC: 10 10*3/uL (ref 4.0–10.5)

## 2016-06-01 LAB — TSH: TSH: 0.89 u[IU]/mL (ref 0.35–4.50)

## 2016-06-01 LAB — HEPATITIS C ANTIBODY: HCV AB: NEGATIVE

## 2016-06-01 LAB — VITAMIN D 25 HYDROXY (VIT D DEFICIENCY, FRACTURES): VITD: 23.66 ng/mL — AB (ref 30.00–100.00)

## 2016-06-01 LAB — VITAMIN B12: VITAMIN B 12: 735 pg/mL (ref 211–911)

## 2016-06-01 NOTE — Progress Notes (Signed)
Patient ID: Candace Long, female   DOB: 07/20/1946, 70 y.o.   MRN: [id number] Complaint:  Visit Type: Patient returns to my office for continued preventative foot care services. Complaint: Patient states" my nails have grown long and thick and become painful to walk and wear shoes" Patient has been diagnosed with DM with no foot  complications. He presents for preventative foot care services. No changes to ROS  Podiatric Exam: Vascular: dorsalis pedis and posterior tibial pulses are palpable bilateral. Capillary return is immediate. Temperature gradient is WNL. Skin turgor WNL  Sensorium: Normal Semmes Weinstein monofilament test. Normal tactile sensation bilaterally. Nail Exam: Pt has thick disfigured discolored nails with subungual debris noted bilateral entire nail hallux through fifth toenails Ulcer Exam: There is no evidence of ulcer or pre-ulcerative changes or infection. Orthopedic Exam: Muscle tone and strength are WNL. No limitations in general ROM. No crepitus or effusions noted. Foot type and digits show no abnormalities. Bony prominences are unremarkable. Skin: No Porokeratosis. No infection or ulcers  Diagnosis:  Tinea unguium, Pain in right toe, pain in left toes  Treatment & Plan Procedures and Treatment: Consent by patient was obtained for treatment procedures. The patient understood the discussion of treatment and procedures well. All questions were answered thoroughly reviewed. Debridement of mycotic and hypertrophic toenails, 1 through 5 bilateral and clearing of subungual debris. No ulceration, no infection noted.  Return Visit-Office Procedure: Patient instructed to return to the office for a follow up visit 3 months for continued evaluation and treatment.  Gardiner Barefoot DPM

## 2016-06-03 ENCOUNTER — Other Ambulatory Visit: Payer: Self-pay | Admitting: Family Medicine

## 2016-06-03 ENCOUNTER — Encounter: Payer: Medicare Other | Admitting: Family Medicine

## 2016-06-03 MED ORDER — POTASSIUM CHLORIDE CRYS ER 20 MEQ PO TBCR: 20.0000 meq | EXTENDED_RELEASE_TABLET | Freq: Every day | ORAL | Status: DC

## 2016-06-10 ENCOUNTER — Encounter: Payer: Self-pay | Admitting: Family Medicine

## 2016-06-10 ENCOUNTER — Ambulatory Visit (INDEPENDENT_AMBULATORY_CARE_PROVIDER_SITE_OTHER): Payer: Medicare Other | Admitting: Family Medicine

## 2016-06-10 VITALS — BP 122/70 | HR 76 | Temp 98.3°F | Ht 68.0 in | Wt 245.1 lb

## 2016-06-10 DIAGNOSIS — N183 Chronic kidney disease, stage 3 unspecified: Secondary | ICD-10-CM

## 2016-06-10 DIAGNOSIS — E785 Hyperlipidemia, unspecified: Secondary | ICD-10-CM

## 2016-06-10 DIAGNOSIS — R7989 Other specified abnormal findings of blood chemistry: Secondary | ICD-10-CM | POA: Diagnosis not present

## 2016-06-10 DIAGNOSIS — Z0000 Encounter for general adult medical examination without abnormal findings: Secondary | ICD-10-CM

## 2016-06-10 DIAGNOSIS — E871 Hypo-osmolality and hyponatremia: Secondary | ICD-10-CM | POA: Diagnosis not present

## 2016-06-10 DIAGNOSIS — E1121 Type 2 diabetes mellitus with diabetic nephropathy: Secondary | ICD-10-CM

## 2016-06-10 DIAGNOSIS — R3589 Other polyuria: Secondary | ICD-10-CM

## 2016-06-10 DIAGNOSIS — Z7189 Other specified counseling: Secondary | ICD-10-CM

## 2016-06-10 DIAGNOSIS — H9190 Unspecified hearing loss, unspecified ear: Secondary | ICD-10-CM | POA: Insufficient documentation

## 2016-06-10 DIAGNOSIS — Z794 Long term (current) use of insulin: Secondary | ICD-10-CM

## 2016-06-10 DIAGNOSIS — IMO0001 Reserved for inherently not codable concepts without codable children: Secondary | ICD-10-CM | POA: Insufficient documentation

## 2016-06-10 DIAGNOSIS — H9193 Unspecified hearing loss, bilateral: Secondary | ICD-10-CM | POA: Diagnosis not present

## 2016-06-10 DIAGNOSIS — R358 Other polyuria: Secondary | ICD-10-CM

## 2016-06-10 DIAGNOSIS — G3184 Mild cognitive impairment, so stated: Secondary | ICD-10-CM

## 2016-06-10 DIAGNOSIS — D75838 Other thrombocytosis: Secondary | ICD-10-CM

## 2016-06-10 DIAGNOSIS — Z6835 Body mass index (BMI) 35.0-35.9, adult: Secondary | ICD-10-CM

## 2016-06-10 DIAGNOSIS — E559 Vitamin D deficiency, unspecified: Secondary | ICD-10-CM

## 2016-06-10 DIAGNOSIS — I10 Essential (primary) hypertension: Secondary | ICD-10-CM

## 2016-06-10 LAB — PLATELET COUNT: PLATELETS: 550 10*3/uL — AB (ref 140–400)

## 2016-06-10 MED ORDER — AMLODIPINE BESYLATE 5 MG PO TABS: ORAL_TABLET | ORAL | Status: DC

## 2016-06-10 NOTE — Assessment & Plan Note (Signed)
Chronic issue since emergent aneurysm surgery 1991, on disability since.

## 2016-06-10 NOTE — Assessment & Plan Note (Addendum)
Chronic, stable. Continue current regimen. Decrease amlodipine to 5mg  daily due to concern over developing pedal edema.

## 2016-06-10 NOTE — Assessment & Plan Note (Signed)
Audiology referral placed today per daughter request. Failed hearing screen. No significant cerumen in canals.

## 2016-06-10 NOTE — Assessment & Plan Note (Signed)
Reports compliance with 1000 IU daily.  

## 2016-06-10 NOTE — Patient Instructions (Addendum)
Schedule well woman, bone density scan and mammogram at your convenience (likely through obgyn). Think about shingles shot. Will likely not be covered by insurance.  Repeat labs today and urinalysis We will refer you to audiologist for hearing evaluation.. Decrease amlodipine to 1/2 tablet daily (41m daily). New dose at pharmacy. Return as needed or in 6 months for diabetes follow up visit.  Health Maintenance, Female Adopting a healthy lifestyle and getting preventive care can go a long way to promote health and wellness. Talk with your health care provider about what schedule of regular examinations is right for you. This is a good chance for you to check in with your provider about disease prevention and staying healthy. In between checkups, there are plenty of things you can do on your own. Experts have done a lot of research about which lifestyle changes and preventive measures are most likely to keep you healthy. Ask your health care provider for more information. WEIGHT AND DIET  Eat a healthy diet  Be sure to include plenty of vegetables, fruits, low-fat dairy products, and lean protein.  Do not eat a lot of foods high in solid fats, added sugars, or salt.  Get regular exercise. This is one of the most important things you can do for your health.  Most adults should exercise for at least 150 minutes each week. The exercise should increase your heart rate and make you sweat (moderate-intensity exercise).  Most adults should also do strengthening exercises at least twice a week. This is in addition to the moderate-intensity exercise.  Maintain a healthy weight  Body mass index (BMI) is a measurement that can be used to identify possible weight problems. It estimates body fat based on height and weight. Your health care provider can help determine your BMI and help you achieve or maintain a healthy weight.  For females 245years of age and older:   A BMI below 18.5 is considered  underweight.  A BMI of 18.5 to 24.9 is normal.  A BMI of 25 to 29.9 is considered overweight.  A BMI of 30 and above is considered obese.  Watch levels of cholesterol and blood lipids  You should start having your blood tested for lipids and cholesterol at 68years of age, then have this test every 5 years.  You may need to have your cholesterol levels checked more often if:  Your lipid or cholesterol levels are high.  You are older than 68years of age.  You are at high risk for heart disease.  CANCER SCREENING   Lung Cancer  Lung cancer screening is recommended for adults 535831years old who are at high risk for lung cancer because of a history of smoking.  A yearly low-dose CT scan of the lungs is recommended for people who:  Currently smoke.  Have quit within the past 15 years.  Have at least a 30-pack-year history of smoking. A pack year is smoking an average of one pack of cigarettes a day for 1 year.  Yearly screening should continue until it has been 15 years since you quit.  Yearly screening should stop if you develop a health problem that would prevent you from having lung cancer treatment.  Breast Cancer  Practice breast self-awareness. This means understanding how your breasts normally appear and feel.  It also means doing regular breast self-exams. Let your health care provider know about any changes, no matter how small.  If you are in your 20s or 30s, you  should have a clinical breast exam (CBE) by a health care provider every 1-3 years as part of a regular health exam.  If you are 9 or older, have a CBE every year. Also consider having a breast X-ray (mammogram) every year.  If you have a family history of breast cancer, talk to your health care provider about genetic screening.  If you are at high risk for breast cancer, talk to your health care provider about having an MRI and a mammogram every year.  Breast cancer gene (BRCA) assessment is  recommended for women who have family members with BRCA-related cancers. BRCA-related cancers include:  Breast.  Ovarian.  Tubal.  Peritoneal cancers.  Results of the assessment will determine the need for genetic counseling and BRCA1 and BRCA2 testing. Cervical Cancer Your health care provider may recommend that you be screened regularly for cancer of the pelvic organs (ovaries, uterus, and vagina). This screening involves a pelvic examination, including checking for microscopic changes to the surface of your cervix (Pap test). You may be encouraged to have this screening done every 3 years, beginning at age 61.  For women ages 39-65, health care providers may recommend pelvic exams and Pap testing every 3 years, or they may recommend the Pap and pelvic exam, combined with testing for human papilloma virus (HPV), every 5 years. Some types of HPV increase your risk of cervical cancer. Testing for HPV may also be done on women of any age with unclear Pap test results.  Other health care providers may not recommend any screening for nonpregnant women who are considered low risk for pelvic cancer and who do not have symptoms. Ask your health care provider if a screening pelvic exam is right for you.  If you have had past treatment for cervical cancer or a condition that could lead to cancer, you need Pap tests and screening for cancer for at least 20 years after your treatment. If Pap tests have been discontinued, your risk factors (such as having a new sexual partner) need to be reassessed to determine if screening should resume. Some women have medical problems that increase the chance of getting cervical cancer. In these cases, your health care provider may recommend more frequent screening and Pap tests. Colorectal Cancer  This type of cancer can be detected and often prevented.  Routine colorectal cancer screening usually begins at 69 years of age and continues through 68 years of  age.  Your health care provider may recommend screening at an earlier age if you have risk factors for colon cancer.  Your health care provider may also recommend using home test kits to check for hidden blood in the stool.  A small camera at the end of a tube can be used to examine your colon directly (sigmoidoscopy or colonoscopy). This is done to check for the earliest forms of colorectal cancer.  Routine screening usually begins at age 25.  Direct examination of the colon should be repeated every 5-10 years through 68 years of age. However, you may need to be screened more often if early forms of precancerous polyps or small growths are found. Skin Cancer  Check your skin from head to toe regularly.  Tell your health care provider about any new moles or changes in moles, especially if there is a change in a mole's shape or color.  Also tell your health care provider if you have a mole that is larger than the size of a pencil eraser.  Always use sunscreen.  Apply sunscreen liberally and repeatedly throughout the day.  Protect yourself by wearing long sleeves, pants, a wide-brimmed hat, and sunglasses whenever you are outside. HEART DISEASE, DIABETES, AND HIGH BLOOD PRESSURE   High blood pressure causes heart disease and increases the risk of stroke. High blood pressure is more likely to develop in:  People who have blood pressure in the high end of the normal range (130-139/85-89 mm Hg).  People who are overweight or obese.  People who are African American.  If you are 37-23 years of age, have your blood pressure checked every 3-5 years. If you are 29 years of age or older, have your blood pressure checked every year. You should have your blood pressure measured twice--once when you are at a hospital or clinic, and once when you are not at a hospital or clinic. Record the average of the two measurements. To check your blood pressure when you are not at a hospital or clinic, you can  use:  An automated blood pressure machine at a pharmacy.  A home blood pressure monitor.  If you are between 11 years and 52 years old, ask your health care provider if you should take aspirin to prevent strokes.  Have regular diabetes screenings. This involves taking a blood sample to check your fasting blood sugar level.  If you are at a normal weight and have a low risk for diabetes, have this test once every three years after 68 years of age.  If you are overweight and have a high risk for diabetes, consider being tested at a younger age or more often. PREVENTING INFECTION  Hepatitis B  If you have a higher risk for hepatitis B, you should be screened for this virus. You are considered at high risk for hepatitis B if:  You were born in a country where hepatitis B is common. Ask your health care provider which countries are considered high risk.  Your parents were born in a high-risk country, and you have not been immunized against hepatitis B (hepatitis B vaccine).  You have HIV or AIDS.  You use needles to inject street drugs.  You live with someone who has hepatitis B.  You have had sex with someone who has hepatitis B.  You get hemodialysis treatment.  You take certain medicines for conditions, including cancer, organ transplantation, and autoimmune conditions. Hepatitis C  Blood testing is recommended for:  Everyone born from [birth year] through 1963.  Anyone with known risk factors for hepatitis C. Sexually transmitted infections (STIs)  You should be screened for sexually transmitted infections (STIs) including gonorrhea and chlamydia if:  You are sexually active and are younger than 68 years of age.  You are older than 68 years of age and your health care provider tells you that you are at risk for this type of infection.  Your sexual activity has changed since you were last screened and you are at an increased risk for chlamydia or gonorrhea. Ask your health care  provider if you are at risk.  If you do not have HIV, but are at risk, it may be recommended that you take a prescription medicine daily to prevent HIV infection. This is called pre-exposure prophylaxis (PrEP). You are considered at risk if:  You are sexually active and do not regularly use condoms or know the HIV status of your partner(s).  You take drugs by injection.  You are sexually active with a partner who has HIV. Talk with your health care provider about  whether you are at high risk of being infected with HIV. If you choose to begin PrEP, you should first be tested for HIV. You should then be tested every 3 months for as long as you are taking PrEP.  PREGNANCY   If you are premenopausal and you may become pregnant, ask your health care provider about preconception counseling.  If you may become pregnant, take 400 to 800 micrograms (mcg) of folic acid every day.  If you want to prevent pregnancy, talk to your health care provider about birth control (contraception). OSTEOPOROSIS AND MENOPAUSE   Osteoporosis is a disease in which the bones lose minerals and strength with aging. This can result in serious bone fractures. Your risk for osteoporosis can be identified using a bone density scan.  If you are 78 years of age or older, or if you are at risk for osteoporosis and fractures, ask your health care provider if you should be screened.  Ask your health care provider whether you should take a calcium or vitamin D supplement to lower your risk for osteoporosis.  Menopause may have certain physical symptoms and risks.  Hormone replacement therapy may reduce some of these symptoms and risks. Talk to your health care provider about whether hormone replacement therapy is right for you.  HOME CARE INSTRUCTIONS   Schedule regular health, dental, and eye exams.  Stay current with your immunizations.   Do not use any tobacco products including cigarettes, chewing tobacco, or  electronic cigarettes.  If you are pregnant, do not drink alcohol.  If you are breastfeeding, limit how much and how often you drink alcohol.  Limit alcohol intake to no more than 1 drink per day for nonpregnant women. One drink equals 12 ounces of beer, 5 ounces of wine, or 1 ounces of hard liquor.  Do not use street drugs.  Do not share needles.  Ask your health care provider for help if you need support or information about quitting drugs.  Tell your health care provider if you often feel depressed.  Tell your health care provider if you have ever been abused or do not feel safe at home.   This information is not intended to replace advice given to you by your health care provider. Make sure you discuss any questions you have with your health care provider.   Document Released: 06/14/2011 Document Revised: 12/20/2014 Document Reviewed: 10/31/2013 Elsevier Interactive Patient Education [employer].

## 2016-06-10 NOTE — Assessment & Plan Note (Signed)

## 2016-06-10 NOTE — Assessment & Plan Note (Signed)
Cr has bumped up. + vit D def, anemia, check SPEP today. Consider renal US

## 2016-06-10 NOTE — Assessment & Plan Note (Addendum)
Reactive by peripheral smear 05/2015, however chronic thrombocytosis present since at least 2015.  Check SPEP today and plt count, consider heme referral to eval for ET.

## 2016-06-10 NOTE — Assessment & Plan Note (Signed)
DIL endorses significant daytime/night time polyuria - attributed to high water intake throughout the day. Pt states she feels thirsty. Recheck renal panel today, check SPEP, check UA.

## 2016-06-10 NOTE — Progress Notes (Signed)
BP 122/70 mmHg  Pulse 76  Temp(Src) 98.3 F (36.8 C)  Ht 5\' 8"  (1.727 m)  Wt 245 lb 1.9 oz (111.186 kg)  BMI 37.28 kg/m2  SpO2 98%   CC: medicare wellness visit  Subjective:    Patient ID: Candace Long, female    DOB: 1946/07/17, 70 y.o.   MRN: [id number]  HPI: Candace Long is a 70 y.o. female presenting on 06/10/2016 for Medicare Wellness; Foot Swelling; and Joint Swelling   Presents with DIL today.   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   1 wk h/o leg swelling at ankles. No dyspnea, chest pain. Some abd distension, longstanding.   Urinary incontinence issues at night time, along with nocturia and polyuria. Drinks excessive water throughout the day according to DIL. rec decrease water intake to start.   Failed hearing screen. Family concerned. Worked in [employer] - lots of noise.   Eye exam at eye center yearly (done last month) Denies depression, falls or anhedonia.  Preventative:  COLONOSCOPY Date: 07/2014 1 tubular adenoma, severe diverticulosis, rpt 5 yrs Henrene Pastor) Mammogram 02/2014 WNL, planning on scheduling. Well woman - no recent pelvic exam. Again daughter and patient request deferred today, discussed did not reschedule last time. Planning on establishing with Dr Cletis Media. Dexa - planning on going to Dr Cletis Media H/o disability after cerebral aneurysm repaired. Flu - yearly Tetanus unsure Prevnar 02/2014, pneumovax 05/2015 Shingles - not done  Advanced directive - HCPOA is son Juanda Crumble. Brings POA form today which was scanned (05/2016). Has small section on HCPOA, but no formal living will. Seat belt use discussed Sunscreen use discussed. No changing moles on skin.  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 Interim medical history since our last visit reviewed. Allergies and medications reviewed and updated. Current Outpatient Prescriptions on File Prior to Visit  Medication Sig  . albuterol (VENTOLIN HFA) 108 (90 Base) MCG/ACT inhaler Inhale 2 puffs into the lungs every 6 (six) hours as needed for wheezing or shortness of breath.  Marland Kitchen aspirin EC 81 MG tablet Take 81 mg by mouth daily.   Marland Kitchen atorvastatin (LIPITOR) 40 MG tablet Take 1 tablet (40 mg total) by mouth daily.  . Cholecalciferol (VITAMIN D) 1000 UNITS capsule Take 1 capsule (1,000 Units total) by mouth daily.  . Insulin Pen Needle (B-D ULTRAFINE III SHORT PEN) 31G X 8 MM MISC Use to inject Lantus once daily.  Marland Kitchen LANTUS SOLOSTAR 100 UNIT/ML Solostar Pen INJECT 30 UNITS INTO THE SKIN DAILY.  Marland Kitchen lisinopril (PRINIVIL,ZESTRIL) 40 MG tablet Take 1 tablet (40 mg total) by mouth daily.  . metFORMIN (GLUCOPHAGE) 1000 MG tablet TAKE 1 TABLET (1,000 MG TOTAL) BY MOUTH 2 (TWO) TIMES DAILY WITH A MEAL.  Marland Kitchen ondansetron (ZOFRAN) 8 MG tablet Take 1 tablet (8 mg total) by mouth every 8 (eight) hours as needed for nausea or vomiting.  . potassium chloride SA (K-DUR,KLOR-CON) 20 MEQ tablet Take 1 tablet (20 mEq total) by mouth daily.  . vitamin B-12 (CYANOCOBALAMIN) 1000 MCG tablet Take 1 tablet (1,000 mcg total) by mouth daily.   No current facility-administered medications on file prior to visit.    Review of Systems Per  HPI unless specifically indicated in ROS section     Objective:    BP 122/70 mmHg  Pulse 76  Temp(Src) 98.3 F (36.8 C)  Ht 5\' 8"  (1.727 m)  Wt 245 lb 1.9 oz (111.186 kg)  BMI 37.28 kg/m2  SpO2 98%  Wt Readings from Last 3 Encounters:  06/10/16 245 lb 1.9 oz (111.186 kg)  04/04/16 240 lb (108.863 kg)  11/27/15 242 lb 1.9 oz (109.825 kg)    Physical Exam  Constitutional: She is oriented to person, place, and time. She appears  well-developed and well-nourished. No distress.  HENT:  Head: Normocephalic and atraumatic.  Right Ear: Hearing, tympanic membrane, external ear and ear canal normal.  Left Ear: Hearing, tympanic membrane, external ear and ear canal normal.  Nose: Nose normal.  Mouth/Throat: Uvula is midline, oropharynx is clear and moist and mucous membranes are normal. No oropharyngeal exudate, posterior oropharyngeal edema or posterior oropharyngeal erythema.  No cerumen in canals  Eyes: Conjunctivae and EOM are normal. Pupils are equal, round, and reactive to light. No scleral icterus.  Neck: Normal range of motion. Neck supple. No thyromegaly present.  Cardiovascular: Normal rate, regular rhythm, normal heart sounds and intact distal pulses.   No murmur heard. Pulses:      Radial pulses are 2+ on the right side, and 2+ on the left side.  Pulmonary/Chest: Effort normal and breath sounds normal. No respiratory distress. She has no wheezes. She has no rales.  Abdominal: Soft. Normal appearance and bowel sounds are normal. She exhibits fluid wave. She exhibits no distension and no mass. There is no hepatosplenomegaly. There is no tenderness. There is no rigidity, no rebound, no guarding, no CVA tenderness and negative Murphy's sign.  Musculoskeletal: Normal range of motion. She exhibits edema (tr pedal).  Lymphadenopathy:    She has no cervical adenopathy.  Neurological: She is alert and oriented to person, place, and time.  CN grossly intact, station and gait intact  Skin: Skin is warm and dry. No rash noted.  Psychiatric: She has a normal mood and affect. Her behavior is normal. Judgment and thought content normal.  Nursing note and vitals reviewed.  Results for orders placed or performed in visit on 06/10/16  HM DIABETES EYE EXAM  Result Value Ref Range   HM Diabetic Eye Exam No Retinopathy No Retinopathy      Assessment & Plan:   Problem List Items Addressed This Visit    Hypertension     Chronic, stable. Continue current regimen. Decrease amlodipine to 5mg  daily due to concern over developing pedal edema.      Relevant Medications   amLODipine (NORVASC) 5 MG tablet   Controlled type 2 diabetes mellitus with diabetic nephropathy (HCC)    Chronic, great control.      HLD (hyperlipidemia)    Discussed healthy diet changes to improve triglyceride levels. Continue atorvastatin.       Relevant Medications   amLODipine (NORVASC) 5 MG tablet   Severe obesity (BMI 35.0-39.9) with comorbidity ([place])    Discussed healthy diet changes to affect sustainable weight loss      Medicare annual wellness visit, subsequent - Primary    I have personally reviewed the Medicare Annual Wellness questionnaire and have noted 1. The patient's medical and social history 2. Their use of alcohol, tobacco or illicit drugs 3. Their current medications and supplements 4. The patient's functional ability including ADL's, fall risks, home safety risks and hearing or visual impairment. Cognitive function has been assessed  and addressed as indicated.  5. Diet and physical activity 6. Evidence for depression or mood disorders The patients weight, height, BMI have been recorded in the chart. I have made referrals, counseling and provided education to the patient based on review of the above and I have provided the pt with a written personalized care plan for preventive services. Provider list updated.. See scanned questionairre as needed for further documentation. Reviewed preventative protocols and updated unless pt declined.       MCI (mild cognitive impairment) with memory loss    Chronic issue since emergent aneurysm surgery 1991, on disability since.      Chronic kidney disease, stage III (moderate)    Cr has bumped up. + vit D def, anemia, check SPEP today. Consider renal US      Relevant Orders   Renal function panel   Serum protein electrophoresis with reflex   Advanced care  planning/counseling discussion    Advanced directive - Chauncey Reading is son Juanda Crumble. Brings POA form today which was scanned (05/2016). Has small section on HCPOA, but no formal living will.      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  Check SPEP today and plt count, consider heme referral to eval for ET.       Relevant Orders   Platelet count   Vitamin D deficiency    Reports compliance with 1000 IU daily.       Hearing impaired    Audiology referral placed today per daughter request. Failed hearing screen. No significant cerumen in canals.      Relevant Orders   Ambulatory referral to Audiology   Polyuria    DIL endorses significant daytime/night time polyuria - attributed to high water intake throughout the day. Pt states she feels thirsty. Recheck renal panel today, check SPEP, check UA.           Follow up plan: Return in about 6 months (around 12/10/2016), or as needed, for follow up visit.  Ria Bush, MD

## 2016-06-10 NOTE — Assessment & Plan Note (Signed)
Chronic, great control.

## 2016-06-10 NOTE — Assessment & Plan Note (Addendum)
Advanced directive - Candace Long is [name]. Brings POA form today which was scanned (05/2016). Has small section on HCPOA, but no formal living will.

## 2016-06-10 NOTE — Assessment & Plan Note (Signed)
Discussed healthy diet changes to improve triglyceride levels. Continue atorvastatin.

## 2016-06-10 NOTE — Assessment & Plan Note (Signed)
Discussed healthy diet changes to affect sustainable weight loss. 

## 2016-06-11 LAB — RENAL FUNCTION PANEL
Albumin: 4 g/dL (ref 3.5–5.2)
BUN: 17 mg/dL (ref 6–23)
CALCIUM: 9.5 mg/dL (ref 8.4–10.5)
CHLORIDE: 85 meq/L — AB (ref 96–112)
CO2: 33 meq/L — AB (ref 19–32)
CREATININE: 1.16 mg/dL (ref 0.40–1.20)
GFR: 59.79 mL/min — ABNORMAL LOW (ref >60.00)
Glucose, Bld: 80 mg/dL (ref 70–99)
POTASSIUM: 2.9 meq/L — AB (ref 3.5–5.1)
Phosphorus: 3.3 mg/dL (ref 2.3–4.6)
SODIUM: 129 meq/L — AB (ref 135–145)

## 2016-06-14 DIAGNOSIS — E871 Hypo-osmolality and hyponatremia: Secondary | ICD-10-CM | POA: Diagnosis not present

## 2016-06-14 DIAGNOSIS — N183 Chronic kidney disease, stage 3 (moderate): Secondary | ICD-10-CM | POA: Diagnosis not present

## 2016-06-14 LAB — PROTEIN ELECTROPHORESIS, SERUM, WITH REFLEX
Albumin ELP: 3.7 g/dL — ABNORMAL LOW (ref 3.8–4.8)
Alpha-1-Globulin: 0.4 g/dL — ABNORMAL HIGH (ref 0.2–0.3)
Alpha-2-Globulin: 0.8 g/dL (ref 0.5–0.9)
BETA GLOBULIN: 0.5 g/dL (ref 0.4–0.6)
Beta 2: 0.3 g/dL (ref 0.2–0.5)
GAMMA GLOBULIN: 0.9 g/dL (ref 0.8–1.7)
Total Protein, Serum Electrophoresis: 6.6 g/dL (ref 6.1–8.1)

## 2016-06-14 NOTE — Addendum Note (Signed)
Addended by: Ellamae Sia on: 06/14/2016 11:07 AM   Modules accepted: Orders

## 2016-06-15 LAB — OSMOLALITY, URINE: OSMOLALITY UR: 291 mosm/kg (ref 50–1200)

## 2016-06-15 LAB — SODIUM, URINE, RANDOM: Sodium, Ur: 15 mmol/L — ABNORMAL LOW (ref 28–272)

## 2016-06-15 LAB — IFE INTERPRETATION

## 2016-06-20 ENCOUNTER — Encounter: Payer: Self-pay | Admitting: Family Medicine

## 2016-06-20 ENCOUNTER — Other Ambulatory Visit: Payer: Self-pay | Admitting: Family Medicine

## 2016-06-20 DIAGNOSIS — N183 Chronic kidney disease, stage 3 unspecified: Secondary | ICD-10-CM

## 2016-06-20 DIAGNOSIS — D472 Monoclonal gammopathy: Secondary | ICD-10-CM | POA: Insufficient documentation

## 2016-06-20 DIAGNOSIS — R7989 Other specified abnormal findings of blood chemistry: Secondary | ICD-10-CM

## 2016-06-20 DIAGNOSIS — E871 Hypo-osmolality and hyponatremia: Secondary | ICD-10-CM | POA: Insufficient documentation

## 2016-06-20 DIAGNOSIS — D75838 Other thrombocytosis: Secondary | ICD-10-CM

## 2016-06-26 ENCOUNTER — Other Ambulatory Visit: Payer: Self-pay | Admitting: Family Medicine

## 2016-06-30 ENCOUNTER — Other Ambulatory Visit: Payer: Self-pay | Admitting: Family Medicine

## 2016-06-30 DIAGNOSIS — Z1231 Encounter for screening mammogram for malignant neoplasm of breast: Secondary | ICD-10-CM

## 2016-07-01 ENCOUNTER — Encounter: Payer: Self-pay | Admitting: Internal Medicine

## 2016-07-02 ENCOUNTER — Ambulatory Visit
Admission: RE | Admit: 2016-07-02 | Discharge: 2016-07-02 | Disposition: A | Discharge status: Home / Self Care | Payer: Medicare Other | Source: Ambulatory Visit | Attending: Family Medicine | Admitting: Family Medicine

## 2016-07-02 ENCOUNTER — Other Ambulatory Visit: Payer: Self-pay | Admitting: Family Medicine

## 2016-07-02 DIAGNOSIS — Z1231 Encounter for screening mammogram for malignant neoplasm of breast: Secondary | ICD-10-CM | POA: Diagnosis not present

## 2016-07-02 LAB — HM MAMMOGRAPHY

## 2016-07-05 ENCOUNTER — Encounter: Payer: Self-pay | Admitting: *Deleted

## 2016-07-08 ENCOUNTER — Other Ambulatory Visit: Payer: Self-pay | Admitting: Family Medicine

## 2016-07-08 DIAGNOSIS — E871 Hypo-osmolality and hyponatremia: Secondary | ICD-10-CM

## 2016-07-08 MED ORDER — AMLODIPINE BESYLATE 5 MG PO TABS: ORAL_TABLET | ORAL | 1 refills | Status: DC

## 2016-07-09 ENCOUNTER — Other Ambulatory Visit (INDEPENDENT_AMBULATORY_CARE_PROVIDER_SITE_OTHER): Payer: Medicare Other

## 2016-07-09 DIAGNOSIS — E871 Hypo-osmolality and hyponatremia: Secondary | ICD-10-CM | POA: Diagnosis not present

## 2016-07-09 LAB — BASIC METABOLIC PANEL
BUN: 15 mg/dL (ref 6–23)
CALCIUM: 9.2 mg/dL (ref 8.4–10.5)
CO2: 35 meq/L — AB (ref 19–32)
Chloride: 83 mEq/L — ABNORMAL LOW (ref 96–112)
Creatinine, Ser: 1.18 mg/dL (ref 0.40–1.20)
GFR: 58.61 mL/min — ABNORMAL LOW (ref >60.00)
GLUCOSE: 163 mg/dL — AB (ref 70–99)
Potassium: 3.1 mEq/L — ABNORMAL LOW (ref 3.5–5.1)
SODIUM: 129 meq/L — AB (ref 135–145)

## 2016-07-14 ENCOUNTER — Other Ambulatory Visit: Payer: Self-pay | Admitting: Family Medicine

## 2016-07-14 MED ORDER — POTASSIUM CHLORIDE CRYS ER 20 MEQ PO TBCR: 20.0000 meq | EXTENDED_RELEASE_TABLET | Freq: Two times a day (BID) | ORAL | 6 refills | Status: DC

## 2016-07-15 ENCOUNTER — Encounter: Payer: Self-pay | Admitting: *Deleted

## 2016-08-31 ENCOUNTER — Encounter: Payer: Self-pay | Admitting: Podiatry

## 2016-08-31 ENCOUNTER — Ambulatory Visit (INDEPENDENT_AMBULATORY_CARE_PROVIDER_SITE_OTHER): Payer: Medicare Other | Admitting: Podiatry

## 2016-08-31 VITALS — BP 139/94 | HR 94 | Resp 14

## 2016-08-31 DIAGNOSIS — M79673 Pain in unspecified foot: Secondary | ICD-10-CM | POA: Diagnosis not present

## 2016-08-31 DIAGNOSIS — B351 Tinea unguium: Secondary | ICD-10-CM | POA: Diagnosis not present

## 2016-08-31 NOTE — Progress Notes (Signed)
Patient ID: Candace Long, female   DOB: 04-Jan-1946, 70 y.o.   MRN: [id number] Complaint:  Visit Type: Patient returns to my office for continued preventative foot care services. Complaint: Patient states" my nails have grown long and thick and become painful to walk and wear shoes" Patient has been diagnosed with DM with no foot  complications. He presents for preventative foot care services. No changes to ROS  Podiatric Exam: Vascular: dorsalis pedis and posterior tibial pulses are palpable bilateral. Capillary return is immediate. Temperature gradient is WNL. Skin turgor WNL  Sensorium: Normal Semmes Weinstein monofilament test. Normal tactile sensation bilaterally. Nail Exam: Pt has thick disfigured discolored nails with subungual debris noted bilateral entire nail hallux through fifth toenails Ulcer Exam: There is no evidence of ulcer or pre-ulcerative changes or infection. Orthopedic Exam: Muscle tone and strength are WNL. No limitations in general ROM. No crepitus or effusions noted. Foot type and digits show no abnormalities. Bony prominences are unremarkable. Skin: No Porokeratosis. No infection or ulcers  Diagnosis:  Tinea unguium, Pain in right toe, pain in left toes  Treatment & Plan Procedures and Treatment: Consent by patient was obtained for treatment procedures. The patient understood the discussion of treatment and procedures well. All questions were answered thoroughly reviewed. Debridement of mycotic and hypertrophic toenails, 1 through 5 bilateral and clearing of subungual debris. No ulceration, no infection noted.  Return Visit-Office Procedure: Patient instructed to return to the office for a follow up visit 3 months for continued evaluation and treatment.  Gardiner Barefoot DPM

## 2016-09-05 DIAGNOSIS — D649 Anemia, unspecified: Secondary | ICD-10-CM | POA: Insufficient documentation

## 2016-09-05 NOTE — Progress Notes (Unsigned)
[facility] day:  09/05/2016  Chief Complaint: Candace Long is a 70 y.o. female with thrombocytosis who is referred in consultation by Dr. Danise Mina for assessment and management.  HPI: ***  Review of available CBCs dating back to 05/24/2014 revealed a white count ranging between 534,000 - 601,000.  CBC on 05/31/2016 included a hematocrit of 35, hemoglobin 11.4, MCV 87.1, platelets 554,000, white count 10,000 with an ANC of 7100. Pharyngeal was normal.  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  Past Medical History:  Diagnosis Date  . Chronic kidney disease, stage III (moderate) 05/24/2014  . HLD (hyperlipidemia)   . Hypertension   . Obesity   . S/P cerebral aneurysm repair 1993   some residual memory impairment  . Uncontrolled type 2 diabetes mellitus with nephropathy [facility])    DSME [facility] 06/2014, did not keep f/u classes so discharged    Past Surgical History:  Procedure Laterality Date  . CARDIOVASCULAR STRESS TEST  12/2012   WNL EF 55% ([place])  . COLONOSCOPY  07/2014   1 tubular adenoma, severe diverticulosis, rpt 5 yrs Henrene Pastor)  . CRANIOTOMY  1993   cerebral aneurysm repair  . VENTRICULOPERITONEAL SHUNT  1993    Family History  Problem Relation Age of Onset  . Hypertension Mother   . Diabetes Mother   . Diabetes Sister   . Heart failure Sister   . CAD Neg Hx   . Stroke Neg Hx   . Cancer Neg Hx   . Colon cancer Neg Hx   . Esophageal cancer Neg Hx   . Rectal cancer Neg Hx   . Stomach cancer Neg Hx     Social History:  reports that she quit smoking about 7 years ago. Her smoking use included Cigarettes. She smoked 1.00 pack per day. She quit smokeless tobacco use about 5 years ago. Her smokeless tobacco use included Snuff. She reports that she does not drink alcohol or use drugs.  The patient is accompanied by ***  alone today.  Allergies:  Allergies  Allergen Reactions  . Hydrochlorothiazide Other (See Comments)    Hypokalemia, hyponatremia    Current Medications: Current Outpatient Prescriptions  Medication Sig Dispense Refill  . albuterol (VENTOLIN HFA) 108 (90 Base) MCG/ACT inhaler Inhale 2 puffs into the lungs every 6 (six) hours as needed for wheezing or shortness of breath. 1 Inhaler 3  . amLODipine (NORVASC) 5 MG tablet TAKE 1 TABLET (5 MG TOTAL) BY MOUTH DAILY. 90 tablet 1  . aspirin EC 81 MG tablet Take 81 mg by mouth daily.     Marland Kitchen atorvastatin (LIPITOR) 40 MG tablet TAKE 1 TABLET (40 MG TOTAL) BY MOUTH DAILY. 90 tablet 2  . Cholecalciferol (VITAMIN D) 1000 UNITS capsule Take 1 capsule (1,000 Units total) by mouth daily.    . Insulin Pen Needle (B-D ULTRAFINE III SHORT PEN) 31G X 8 MM MISC Use to inject Lantus once daily. 100 each 3  . LANTUS SOLOSTAR 100 UNIT/ML Solostar Pen INJECT 30 UNITS INTO THE SKIN DAILY. 15 pen 6  . lisinopril (PRINIVIL,ZESTRIL) 40 MG tablet Take 1 tablet (40 mg total) by mouth daily. 90 tablet 3  . metFORMIN (GLUCOPHAGE) 1000 MG tablet TAKE 1 TABLET (1,000 MG TOTAL) BY MOUTH 2 (TWO) TIMES DAILY WITH A MEAL. 180 tablet 2  . ondansetron (ZOFRAN) 8 MG tablet Take 1 tablet (8 mg total) by  mouth every 8 (eight) hours as needed for nausea or vomiting. 20 tablet 0  . potassium chloride SA (K-DUR,KLOR-CON) 20 MEQ tablet Take 1 tablet (20 mEq total) by mouth 2 (two) times daily. 60 tablet 6  . vitamin B-12 (CYANOCOBALAMIN) 1000 MCG tablet Take 1 tablet (1,000 mcg total) by mouth daily.     No current facility-administered medications for this visit.     Review of Systems:  GENERAL:  Feels good.  Active.  No fevers, sweats or weight loss. PERFORMANCE STATUS (ECOG):  *** HEENT:  No visual changes, runny nose, sore throat, mouth sores or tenderness. Lungs: No shortness of breath or cough.  No hemoptysis. Cardiac:  No chest pain, palpitations, orthopnea, or PND. GI:  No  nausea, vomiting, diarrhea, constipation, melena or hematochezia. GU:  No urgency, frequency, dysuria, or hematuria. Musculoskeletal:  No back pain.  No joint pain.  No muscle tenderness. Extremities:  No pain or swelling. Skin:  No rashes or skin changes. Neuro:  No headache, numbness or weakness, balance or coordination issues. Endocrine:  No diabetes, thyroid issues, hot flashes or night sweats. Psych:  No mood changes, depression or anxiety. Pain:  No focal pain. Review of systems:  All other systems reviewed and found to be negative.   Physical Exam: There were no vitals taken for this visit. GENERAL:  Well developed, well nourished, sitting comfortably in the exam room in no acute distress. MENTAL STATUS:  Alert and oriented to person, place and time. HEAD:  *** hair.  Normocephalic, atraumatic, face symmetric, no Cushingoid features. EYES:  *** eyes.  Pupils equal round and reactive to light and accomodation.  No conjunctivitis or scleral icterus. ENT:  Oropharynx clear without lesion.  Tongue normal. Mucous membranes moist.  RESPIRATORY:  Clear to auscultation without rales, wheezes or rhonchi. CARDIOVASCULAR:  Regular rate and rhythm without murmur, rub or gallop. ABDOMEN:  Soft, non-tender, with active bowel sounds, and no hepatosplenomegaly.  No masses. SKIN:  No rashes, ulcers or lesions. EXTREMITIES: No edema, no skin discoloration or tenderness.  No palpable cords. LYMPH NODES: No palpable cervical, supraclavicular, axillary or inguinal adenopathy  NEUROLOGICAL: Unremarkable. PSYCH:  Appropriate.  No visits with results within 3 Day(s) from this visit.  Latest known visit with results is:  Lab on 07/09/2016  Component Date Value Ref Range Status  . Sodium 07/09/2016 129* 135 - 145 mEq/L Final  . Potassium 07/09/2016 3.1* 3.5 - 5.1 mEq/L Final  . Chloride 07/09/2016 83* 96 - 112 mEq/L Final  . CO2 07/09/2016 35* 19 - 32 mEq/L Final  . Glucose, Bld 07/09/2016 163* 70 -  99 mg/dL Final  . BUN 07/09/2016 15  6 - 23 mg/dL Final  . Creatinine, Ser 07/09/2016 1.18  0.40 - 1.20 mg/dL Final  . Calcium 07/09/2016 9.2  8.4 - 10.5 mg/dL Final  . GFR 07/09/2016 58.61* >60.00 mL/min Final    Assessment:  Candace Long is a 70 y.o. female ***  Plan: 1. *** 2. *** 3. *** 4. *** 5. ***  Lequita Asal, MD  09/05/2016, 9:01 PM

## 2016-09-06 ENCOUNTER — Inpatient Hospital Stay: Payer: Medicare Other | Admitting: Hematology and Oncology

## 2016-09-29 ENCOUNTER — Inpatient Hospital Stay: Payer: Medicare Other

## 2016-09-29 ENCOUNTER — Inpatient Hospital Stay: Payer: Medicare Other | Attending: Hematology and Oncology | Admitting: Hematology and Oncology

## 2016-09-29 ENCOUNTER — Encounter: Payer: Self-pay | Admitting: Hematology and Oncology

## 2016-09-29 VITALS — BP 142/88 | HR 85 | Temp 97.2°F | Resp 18 | Ht 68.9 in | Wt 241.0 lb

## 2016-09-29 DIAGNOSIS — Z87891 Personal history of nicotine dependence: Secondary | ICD-10-CM | POA: Diagnosis not present

## 2016-09-29 DIAGNOSIS — N183 Chronic kidney disease, stage 3 unspecified: Secondary | ICD-10-CM

## 2016-09-29 DIAGNOSIS — Z794 Long term (current) use of insulin: Secondary | ICD-10-CM | POA: Insufficient documentation

## 2016-09-29 DIAGNOSIS — E669 Obesity, unspecified: Secondary | ICD-10-CM | POA: Insufficient documentation

## 2016-09-29 DIAGNOSIS — D7589 Other specified diseases of blood and blood-forming organs: Secondary | ICD-10-CM | POA: Diagnosis not present

## 2016-09-29 DIAGNOSIS — R778 Other specified abnormalities of plasma proteins: Secondary | ICD-10-CM | POA: Insufficient documentation

## 2016-09-29 DIAGNOSIS — Z7982 Long term (current) use of aspirin: Secondary | ICD-10-CM | POA: Insufficient documentation

## 2016-09-29 DIAGNOSIS — E785 Hyperlipidemia, unspecified: Secondary | ICD-10-CM | POA: Diagnosis not present

## 2016-09-29 DIAGNOSIS — Z79899 Other long term (current) drug therapy: Secondary | ICD-10-CM

## 2016-09-29 DIAGNOSIS — D649 Anemia, unspecified: Secondary | ICD-10-CM

## 2016-09-29 DIAGNOSIS — E1122 Type 2 diabetes mellitus with diabetic chronic kidney disease: Secondary | ICD-10-CM | POA: Diagnosis not present

## 2016-09-29 DIAGNOSIS — D473 Essential (hemorrhagic) thrombocythemia: Secondary | ICD-10-CM

## 2016-09-29 DIAGNOSIS — I129 Hypertensive chronic kidney disease with stage 1 through stage 4 chronic kidney disease, or unspecified chronic kidney disease: Secondary | ICD-10-CM | POA: Diagnosis not present

## 2016-09-29 DIAGNOSIS — D75839 Thrombocytosis, unspecified: Secondary | ICD-10-CM

## 2016-09-29 DIAGNOSIS — D631 Anemia in chronic kidney disease: Secondary | ICD-10-CM | POA: Diagnosis not present

## 2016-09-29 LAB — CBC WITH DIFFERENTIAL/PLATELET
BASOS PCT: 1 %
Basophils Absolute: 0.1 10*3/uL (ref 0–0.1)
EOS ABS: 0.3 10*3/uL (ref 0–0.7)
EOS PCT: 3 %
HCT: 34.9 % — ABNORMAL LOW (ref 35.0–47.0)
Hemoglobin: 11.6 g/dL — ABNORMAL LOW (ref 12.0–16.0)
LYMPHS ABS: 2.2 10*3/uL (ref 1.0–3.6)
Lymphocytes Relative: 24 %
MCH: 28.5 pg (ref 26.0–34.0)
MCHC: 33.2 g/dL (ref 32.0–36.0)
MCV: 85.9 fL (ref 80.0–100.0)
Monocytes Absolute: 0.5 10*3/uL (ref 0.2–0.9)
Monocytes Relative: 6 %
NEUTROS PCT: 66 %
Neutro Abs: 6.1 10*3/uL (ref 1.4–6.5)
PLATELETS: 511 10*3/uL — AB (ref 150–440)
RBC: 4.06 MIL/uL (ref 3.80–5.20)
RDW: 13.7 % (ref 11.5–14.5)
WBC: 9.1 10*3/uL (ref 3.6–11.0)

## 2016-09-29 LAB — IRON AND TIBC
IRON: 57 ug/dL (ref 28–170)
SATURATION RATIOS: 18 % (ref 10.4–31.8)
TIBC: 322 ug/dL (ref 250–450)
UIBC: 265 ug/dL

## 2016-09-29 LAB — RETICULOCYTES
RBC.: 4.06 MIL/uL (ref 3.80–5.20)
RETIC CT PCT: 1.6 % (ref 0.4–3.1)
Retic Count, Absolute: 65 10*3/uL (ref 19.0–183.0)

## 2016-09-29 LAB — SEDIMENTATION RATE: SED RATE: 48 mm/h — AB (ref 0–30)

## 2016-09-29 LAB — FERRITIN: Ferritin: 99 ng/mL (ref 11–307)

## 2016-09-29 NOTE — Assessment & Plan Note (Signed)
Most likely cause is anemia of chronic illness. I will order additional workup. In the meantime, she will continue vitamin B-12 supplement.

## 2016-09-29 NOTE — Assessment & Plan Note (Signed)
She has borderline abnormal SPEP in June. I will repeat myeloma panel and free light chain for further assessment

## 2016-09-29 NOTE — Assessment & Plan Note (Signed)
She is not symptomatic but I am concerned about increased risk of heart attack and stroke given her other major risk factors such as diabetes and hypertension. I will order additional workup for this to exclude reactive thrombocytosis, and diagnosed myeloproliferative disorder, etc. In the meantime, I reinforced the importance of taking aspirin

## 2016-09-29 NOTE — Progress Notes (Signed)
[place] NOTE  Patient Care Team: Ria Bush, MD as PCP - General (Family Medicine)  CHIEF COMPLAINTS/PURPOSE OF CONSULTATION:  Chronic thrombocytosis  HISTORY OF PRESENTING ILLNESS:  Candace Long 70 y.o. female is here because of elevated platelet count I have the opportunity to review her CBC from electronic records.  She was found to have abnormal CBC from surveillance blood work monitoring. Her platelet count has been elevated since June 2015 which her platelet count ranging from 534,000 to 601,000 She is also noted to have normocytic anemia and abnormal SPEP The patient denies history of blood clots such as stroke, heart attack, DVT She is not consistent taking aspirin therapy She denies recent infection. The last prescription antibiotics was more than 3 months ago There is not reported symptoms of sinus congestion, cough, urinary frequency/urgency or dysuria, diarrhea, joint swelling/pain or abnormal skin rash.  She had no prior history or diagnosis of cancer. Her age appropriate screening programs are up-to-date. The patient denies any recent signs or symptoms of bleeding such as spontaneous epistaxis, hematuria or hematochezia. She denies abnormal bone pain  MEDICAL HISTORY:  Past Medical History:  Diagnosis Date  . Chronic kidney disease, stage III (moderate) 05/24/2014  . HLD (hyperlipidemia)   . Hypertension   . Obesity   . S/P cerebral aneurysm repair 1993   some residual memory impairment  . Uncontrolled type 2 diabetes mellitus with nephropathy [facility])    DSME [facility] 06/2014, did not keep f/u classes so discharged    SURGICAL HISTORY: Past Surgical History:  Procedure Laterality Date  . CARDIOVASCULAR STRESS TEST  12/2012   WNL EF 55% (Harwani)  . COLONOSCOPY  07/2014   1 tubular adenoma, severe diverticulosis, rpt 5 yrs [facility])  . CRANIOTOMY  1993   cerebral aneurysm repair  . VENTRICULOPERITONEAL SHUNT  1993    SOCIAL  HISTORY: Social History   Social History  . Marital status: Divorced    Spouse name: N/A  . Number of children: N/A  . Years of education: N/A   Occupational History  . Not on file.   Social History Main Topics  . Smoking status: Former Smoker    Packs/day: 1.00    Types: Cigarettes    Quit date: 12/13/2008  . Smokeless tobacco: Former Systems developer    Types: Snuff    Quit date: 12/13/2010  . Alcohol use No  . Drug use: No  . Sexual activity: Not on file   Other Topics Concern  . Not on file   Social History Narrative   Lives with son and his wife and 1 granddaughter   Occupation: retired, Engineer, manufacturing systems   Disability since 1993 after aneurysm   Edu: HS   Activity: no regular exercise   Diet: good water, some fruits, vegetables daily      Advanced directives: Son is HCPOA    FAMILY HISTORY: Family History  Problem Relation Age of Onset  . Hypertension Mother   . Diabetes Mother   . Diabetes Sister   . Heart failure Sister   . CAD Neg Hx   . Stroke Neg Hx   . Cancer Neg Hx   . Colon cancer Neg Hx   . Esophageal cancer Neg Hx   . Rectal cancer Neg Hx   . Stomach cancer Neg Hx     ALLERGIES:  is allergic to hydrochlorothiazide.  MEDICATIONS:  Current Outpatient Prescriptions  Medication Sig Dispense Refill  . albuterol (VENTOLIN HFA) 108 (90 Base) MCG/ACT inhaler  Inhale 2 puffs into the lungs every 6 (six) hours as needed for wheezing or shortness of breath. 1 Inhaler 3  . amLODipine (NORVASC) 5 MG tablet TAKE 1 TABLET (5 MG TOTAL) BY MOUTH DAILY. 90 tablet 1  . atorvastatin (LIPITOR) 40 MG tablet TAKE 1 TABLET (40 MG TOTAL) BY MOUTH DAILY. 90 tablet 2  . Cholecalciferol (VITAMIN D) 1000 UNITS capsule Take 1 capsule (1,000 Units total) by mouth daily.    . Insulin Pen Needle (B-D ULTRAFINE III SHORT PEN) 31G X 8 MM MISC Use to inject Lantus once daily. 100 each 3  . LANTUS SOLOSTAR 100 UNIT/ML Solostar Pen INJECT 30 UNITS INTO THE SKIN DAILY. 15 pen 6  . lisinopril  (PRINIVIL,ZESTRIL) 40 MG tablet Take 1 tablet (40 mg total) by mouth daily. 90 tablet 3  . metFORMIN (GLUCOPHAGE) 1000 MG tablet TAKE 1 TABLET (1,000 MG TOTAL) BY MOUTH 2 (TWO) TIMES DAILY WITH A MEAL. 180 tablet 2  . potassium chloride SA (K-DUR,KLOR-CON) 20 MEQ tablet Take 1 tablet (20 mEq total) by mouth 2 (two) times daily. 60 tablet 6  . aspirin EC 81 MG tablet Take 81 mg by mouth daily.     . ondansetron (ZOFRAN) 8 MG tablet Take 1 tablet (8 mg total) by mouth every 8 (eight) hours as needed for nausea or vomiting. (Patient not taking: Reported on 09/29/2016) 20 tablet 0  . vitamin B-12 (CYANOCOBALAMIN) 1000 MCG tablet Take 1 tablet (1,000 mcg total) by mouth daily. (Patient not taking: Reported on 09/29/2016)     No current facility-administered medications for this visit.     REVIEW OF SYSTEMS:   Constitutional: Denies fevers, chills or abnormal night sweats Eyes: Denies blurriness of vision, double vision or watery eyes Ears, nose, mouth, throat, and face: Denies mucositis or sore throat Respiratory: Denies cough, dyspnea or wheezes Cardiovascular: Denies palpitation, chest discomfort or lower extremity swelling Gastrointestinal:  Denies nausea, heartburn or change in bowel habits Skin: Denies abnormal skin rashes Lymphatics: Denies new lymphadenopathy or easy bruising Neurological:Denies numbness, tingling or new weaknesses Behavioral/Psych: Mood is stable, no new changes  All other systems were reviewed with the patient and are negative.  PHYSICAL EXAMINATION: ECOG PERFORMANCE STATUS: 1 - Symptomatic but completely ambulatory  Vitals:   09/29/16 1511  BP: (!) 142/88  Pulse: 85  Resp: 18  Temp: 97.2 F (36.2 C)   Filed Weights   09/29/16 1511  Weight: 240 lb 15.4 oz (109.3 kg)    GENERAL:alert, no distress and comfortable. She is moderately obese SKIN: skin color, texture, turgor are normal, no rashes or significant lesions EYES: normal, conjunctiva are pink and  non-injected, sclera clear OROPHARYNX:no exudate, no erythema and lips, buccal mucosa, and tongue normal  NECK: supple, thyroid normal size, non-tender, without nodularity LYMPH:  no palpable lymphadenopathy in the cervical, axillary or inguinal LUNGS: clear to auscultation and percussion with normal breathing effort HEART: regular rate & rhythm and no murmurs and no lower extremity edema ABDOMEN:abdomen soft, non-tender and normal bowel sounds Musculoskeletal:no cyanosis of digits and no clubbing  PSYCH: alert & oriented x 3 with fluent speech NEURO: no focal motor/sensory deficits  LABORATORY DATA:  I have reviewed the data as listed Recent Results (from the past 2160 hour(s))  HM MAMMOGRAPHY     Status: None   Collection Time: 07/02/16 12:00 AM  Result Value Ref Range   HM Mammogram 0-4 Bi-Rad 0-4 Bi-Rad, Self Reported Normal    Comment: Normal Birads 1-Repeat 1 year  Basic metabolic panel     Status: Abnormal   Collection Time: 07/09/16  3:23 PM  Result Value Ref Range   Sodium 129 (L) 135 - 145 mEq/L   Potassium 3.1 (L) 3.5 - 5.1 mEq/L   Chloride 83 (L) 96 - 112 mEq/L   CO2 35 (H) 19 - 32 mEq/L   Glucose, Bld 163 (H) 70 - 99 mg/dL   BUN 15 6 - 23 mg/dL   Creatinine, Ser 1.18 0.40 - 1.20 mg/dL   Calcium 9.2 8.4 - 10.5 mg/dL   GFR 58.61 (L) >60.00 mL/min   ASSESSMENT & PLAN  Thrombocytosis (HCC) She is not symptomatic but I am concerned about increased risk of heart attack and stroke given her other major risk factors such as diabetes and hypertension. I will order additional workup for this to exclude reactive thrombocytosis, and diagnosed myeloproliferative disorder, etc. In the meantime, I reinforced the importance of taking aspirin   Abnormal SPEP She has borderline abnormal SPEP in June. I will repeat myeloma panel and free light chain for further assessment  Chronic kidney disease, stage III (moderate) This is the most likely cause of her chronic anemia. Continue  risk factor modification  Normocytic anemia Most likely cause is anemia of chronic illness. I will order additional workup. In the meantime, she will continue vitamin B-12 supplement.   Orders Placed This Encounter  Procedures  . Ferritin    Standing Status:   Future    Standing Expiration Date:   11/03/2017  . Sedimentation rate    Standing Status:   Future    Standing Expiration Date:   11/03/2017  . Iron and TIBC    Standing Status:   Future    Standing Expiration Date:   11/03/2017  . Jak2 w/reflex to CALR/MPL    Standing Status:   Future    Standing Expiration Date:   11/03/2017  . CBC with Differential/Platelet    Standing Status:   Future    Standing Expiration Date:   11/03/2017  . Reticulocytes    Standing Status:   Future    Standing Expiration Date:   11/03/2017  . Kappa/lambda light chains    Standing Status:   Future    Standing Expiration Date:   11/03/2017  . Multiple Myeloma Panel (SPEP&IFE w/QIG)    Standing Status:   Future    Standing Expiration Date:   11/03/2017    All questions were answered. The patient knows to call the clinic with any problems, questions or concerns. I spent 40 minutes counseling the patient face to face. The total time spent in the appointment was 55 minutes and more than 50% was on counseling.     Heath Lark, MD 09/29/2016 3:47 PM

## 2016-09-29 NOTE — Assessment & Plan Note (Signed)
This is the most likely cause of her chronic anemia. Continue risk factor modification

## 2016-09-30 ENCOUNTER — Ambulatory Visit: Payer: Medicare Other | Admitting: Hematology and Oncology

## 2016-09-30 LAB — KAPPA/LAMBDA LIGHT CHAINS
KAPPA FREE LGHT CHN: 27.9 mg/L — AB (ref 3.3–19.4)
Kappa, lambda light chain ratio: 1.29 (ref 0.26–1.65)
LAMDA FREE LIGHT CHAINS: 21.7 mg/L (ref 5.7–26.3)

## 2016-10-04 LAB — MULTIPLE MYELOMA PANEL, SERUM
ALBUMIN/GLOB SERPL: 1.3 (ref 0.7–1.7)
Albumin SerPl Elph-Mcnc: 3.6 g/dL (ref 2.9–4.4)
Alpha 1: 0.2 g/dL (ref 0.0–0.4)
Alpha2 Glob SerPl Elph-Mcnc: 0.8 g/dL (ref 0.4–1.0)
B-Globulin SerPl Elph-Mcnc: 1 g/dL (ref 0.7–1.3)
GAMMA GLOB SERPL ELPH-MCNC: 0.9 g/dL (ref 0.4–1.8)
GLOBULIN, TOTAL: 2.9 g/dL (ref 2.2–3.9)
IGA: 113 mg/dL (ref 87–352)
IgG (Immunoglobin G), Serum: 930 mg/dL (ref 700–1600)
IgM, Serum: 108 mg/dL (ref 26–217)
Total Protein ELP: 6.5 g/dL (ref 6.0–8.5)

## 2016-10-11 LAB — JAK2 W/REFLEX TO CALR/MPL

## 2016-10-12 LAB — CALR + MPL (REFLEXED)

## 2016-10-15 ENCOUNTER — Inpatient Hospital Stay: Payer: Medicare Other | Admitting: Hematology and Oncology

## 2016-10-20 ENCOUNTER — Encounter: Payer: Self-pay | Admitting: Hematology and Oncology

## 2016-10-20 ENCOUNTER — Inpatient Hospital Stay: Payer: Medicare Other | Attending: Hematology and Oncology | Admitting: Hematology and Oncology

## 2016-10-20 VITALS — BP 152/79 | HR 90 | Temp 97.2°F | Resp 18 | Wt 247.7 lb

## 2016-10-20 DIAGNOSIS — Z7982 Long term (current) use of aspirin: Secondary | ICD-10-CM | POA: Insufficient documentation

## 2016-10-20 DIAGNOSIS — D472 Monoclonal gammopathy: Secondary | ICD-10-CM

## 2016-10-20 DIAGNOSIS — Z79899 Other long term (current) drug therapy: Secondary | ICD-10-CM | POA: Diagnosis not present

## 2016-10-20 DIAGNOSIS — D649 Anemia, unspecified: Secondary | ICD-10-CM | POA: Insufficient documentation

## 2016-10-20 DIAGNOSIS — E119 Type 2 diabetes mellitus without complications: Secondary | ICD-10-CM | POA: Insufficient documentation

## 2016-10-20 DIAGNOSIS — R700 Elevated erythrocyte sedimentation rate: Secondary | ICD-10-CM | POA: Diagnosis not present

## 2016-10-20 DIAGNOSIS — I10 Essential (primary) hypertension: Secondary | ICD-10-CM | POA: Diagnosis not present

## 2016-10-20 DIAGNOSIS — D473 Essential (hemorrhagic) thrombocythemia: Secondary | ICD-10-CM

## 2016-10-20 DIAGNOSIS — Z794 Long term (current) use of insulin: Secondary | ICD-10-CM | POA: Diagnosis not present

## 2016-10-20 DIAGNOSIS — D75839 Thrombocytosis, unspecified: Secondary | ICD-10-CM

## 2016-10-20 DIAGNOSIS — R778 Other specified abnormalities of plasma proteins: Secondary | ICD-10-CM

## 2016-10-20 NOTE — Progress Notes (Signed)
[place] OFFICE PROGRESS NOTE  Patient Care Team: Ria Bush, MD as PCP - General (Family Medicine)  SUMMARY OF ONCOLOGIC HISTORY:  Candace Long 70 y.o. female is here because of elevated platelet count I have the opportunity to review her CBC from electronic records.  She was found to have abnormal CBC from surveillance blood work monitoring. Her platelet count has been elevated since June 2015 which her platelet count ranging from 534,000 to 601,000 She is also noted to have normocytic anemia and abnormal SPEP The patient denies history of blood clots such as stroke, heart attack, DVT She is not consistent taking aspirin therapy She denies recent infection. The last prescription antibiotics was more than 3 months ago There is not reported symptoms of sinus congestion, cough, urinary frequency/urgency or dysuria, diarrhea, joint swelling/pain or abnormal skin rash.  She had no prior history or diagnosis of cancer. Her age appropriate screening programs are up-to-date. The patient denies any recent signs or symptoms of bleeding such as spontaneous epistaxis, hematuria or hematochezia. She denies abnormal bone pain In October 2017, she underwent extensive blood work for abnormal SPEP and elevated platelet count  INTERVAL HISTORY: Please see below for problem oriented charting. She returns today with her son She feels well. She has started to take aspirin consistently  REVIEW OF SYSTEMS:   Constitutional: Denies fevers, chills or abnormal weight loss Eyes: Denies blurriness of vision Ears, nose, mouth, throat, and face: Denies mucositis or sore throat Respiratory: Denies cough, dyspnea or wheezes Cardiovascular: Denies palpitation, chest discomfort or lower extremity swelling Gastrointestinal:  Denies nausea, heartburn or change in bowel habits Skin: Denies abnormal skin rashes Lymphatics: Denies new lymphadenopathy or easy bruising Neurological:Denies  numbness, tingling or new weaknesses Behavioral/Psych: Mood is stable, no new changes  All other systems were reviewed with the patient and are negative.  I have reviewed the past medical history, past surgical history, social history and family history with the patient and they are unchanged from previous note.  ALLERGIES:  is allergic to hydrochlorothiazide.  MEDICATIONS:  Current Outpatient Prescriptions  Medication Sig Dispense Refill  . albuterol (VENTOLIN HFA) 108 (90 Base) MCG/ACT inhaler Inhale 2 puffs into the lungs every 6 (six) hours as needed for wheezing or shortness of breath. 1 Inhaler 3  . amLODipine (NORVASC) 5 MG tablet TAKE 1 TABLET (5 MG TOTAL) BY MOUTH DAILY. 90 tablet 1  . aspirin EC 81 MG tablet Take 81 mg by mouth daily.     Marland Kitchen atorvastatin (LIPITOR) 40 MG tablet TAKE 1 TABLET (40 MG TOTAL) BY MOUTH DAILY. 90 tablet 2  . Cholecalciferol (VITAMIN D) 1000 UNITS capsule Take 1 capsule (1,000 Units total) by mouth daily.    . Insulin Pen Needle (B-D ULTRAFINE III SHORT PEN) 31G X 8 MM MISC Use to inject Lantus once daily. 100 each 3  . LANTUS SOLOSTAR 100 UNIT/ML Solostar Pen INJECT 30 UNITS INTO THE SKIN DAILY. 15 pen 6  . lisinopril (PRINIVIL,ZESTRIL) 40 MG tablet Take 1 tablet (40 mg total) by mouth daily. 90 tablet 3  . metFORMIN (GLUCOPHAGE) 1000 MG tablet TAKE 1 TABLET (1,000 MG TOTAL) BY MOUTH 2 (TWO) TIMES DAILY WITH A MEAL. 180 tablet 2  . ondansetron (ZOFRAN) 8 MG tablet Take 1 tablet (8 mg total) by mouth every 8 (eight) hours as needed for nausea or vomiting. 20 tablet 0  . potassium chloride SA (K-DUR,KLOR-CON) 20 MEQ tablet Take 1 tablet (20 mEq total) by mouth 2 (two) times  daily. 60 tablet 6  . vitamin B-12 (CYANOCOBALAMIN) 1000 MCG tablet Take 1 tablet (1,000 mcg total) by mouth daily.     No current facility-administered medications for this visit.     PHYSICAL EXAMINATION: ECOG PERFORMANCE STATUS: 1 - Symptomatic but completely ambulatory  Vitals:    10/20/16 1426  BP: (!) 152/79  Pulse: 90  Resp: 18  Temp: 97.2 F (36.2 C)   Filed Weights   10/20/16 1426  Weight: 247 lb 11 oz (112.4 kg)    GENERAL:alert, no distress and comfortable SKIN: skin color, texture, turgor are normal, no rashes or significant lesions EYES: normal, Conjunctiva are pink and non-injected, sclera clear Musculoskeletal:no cyanosis of digits and no clubbing  NEURO: alert & oriented x 3 with fluent speech, no focal motor/sensory deficits  LABORATORY DATA:  I have reviewed the data as listed    Component Value Date/Time   NA 129 (L) 07/09/2016 1523   NA 134 (A) 06/29/2013   NA 134 (A) 06/29/2013   K 3.1 (L) 07/09/2016 1523   K 5.0 06/29/2013   K 5.0 06/29/2013   CL 83 (L) 07/09/2016 1523   CO2 35 (H) 07/09/2016 1523   GLUCOSE 163 (H) 07/09/2016 1523   BUN 15 07/09/2016 1523   CREATININE 1.18 07/09/2016 1523   CREATININE 1.00 (H) 04/04/2016 1127   CALCIUM 9.2 07/09/2016 1523   PROT 7.1 04/04/2016 1127   ALBUMIN 4.0 06/10/2016 1729   AST 16 04/04/2016 1127   AST 12 06/29/2013   AST 12 06/29/2013   ALT 14 04/04/2016 1127   ALKPHOS 122 04/04/2016 1127   ALKPHOS 110 06/29/2013   ALKPHOS 110 06/29/2013   BILITOT 0.7 04/04/2016 1127   BILITOT 0.5 06/29/2013   BILITOT 0.5 06/29/2013    No results found for: SPEP, UPEP  Lab Results  Component Value Date   WBC 9.1 09/29/2016   NEUTROABS 6.1 09/29/2016   HGB 11.6 (L) 09/29/2016   HCT 34.9 (L) 09/29/2016   MCV 85.9 09/29/2016   PLT 511 (H) 09/29/2016      Chemistry      Component Value Date/Time   NA 129 (L) 07/09/2016 1523   NA 134 (A) 06/29/2013   NA 134 (A) 06/29/2013   K 3.1 (L) 07/09/2016 1523   K 5.0 06/29/2013   K 5.0 06/29/2013   CL 83 (L) 07/09/2016 1523   CO2 35 (H) 07/09/2016 1523   BUN 15 07/09/2016 1523   CREATININE 1.18 07/09/2016 1523   CREATININE 1.00 (H) 04/04/2016 1127      Component Value Date/Time   CALCIUM 9.2 07/09/2016 1523   ALKPHOS 122 04/04/2016 1127    ALKPHOS 110 06/29/2013   ALKPHOS 110 06/29/2013   AST 16 04/04/2016 1127   AST 12 06/29/2013   AST 12 06/29/2013   ALT 14 04/04/2016 1127   BILITOT 0.7 04/04/2016 1127   BILITOT 0.5 06/29/2013   BILITOT 0.5 06/29/2013       ASSESSMENT & PLAN:  Thrombocytosis ([place]) She is not symptomatic but I am concerned about increased risk of heart attack and stroke given her other major risk factors such as diabetes and hypertension. Additional workup showed mildly elevated sedimentation rate, nonspecific. Peripheral blood for JAK2 mutation, MPL and CAL-R are negative. We discussed the risk and benefit of bone marrow aspirate and biopsy but the patient declined to test Essential thrombocytosis cannot be excluded.  Her son would like observation only at this point I will order additional tests with BCR/ABL by  fish in her next visit to exclude CML although this is unlikely In the meantime, I reinforced the importance of taking aspirin    Abnormal SPEP Serum protein electrophoresis detected minor component of IgG lambda, unknown significance I plan to repeat the blood work again in 6 months  Normocytic anemia Most likely cause is anemia of chronic illness. In the meantime, she will continue vitamin B-12 supplement.  Hypertension she will continue current medical management. I recommend close follow-up with primary care doctor for medication adjustment.    Orders Placed This Encounter  Procedures  . CBC with Differential/Platelet    Standing Status:   Future    Standing Expiration Date:   11/24/2017  . Comprehensive metabolic panel    Standing Status:   Future    Standing Expiration Date:   11/24/2017  . Kappa/lambda light chains    Standing Status:   Future    Standing Expiration Date:   11/24/2017  . Multiple Myeloma Panel (SPEP&IFE w/QIG)    Standing Status:   Future    Standing Expiration Date:   11/24/2017  . Sedimentation rate    Standing Status:   Future    Standing  Expiration Date:   11/24/2017  . BCR-ABL1 FISH    Standing Status:   Future    Standing Expiration Date:   11/24/2017   All questions were answered. The patient knows to call the clinic with any problems, questions or concerns. No barriers to learning was detected. I spent 15 minutes counseling the patient face to face. The total time spent in the appointment was 20 minutes and more than 50% was on counseling and review of test results     Heath Lark, MD 10/20/2016 3:31 PM

## 2016-10-20 NOTE — Assessment & Plan Note (Signed)
she will continue current medical management. I recommend close follow-up with primary care doctor for medication adjustment.  

## 2016-10-20 NOTE — Assessment & Plan Note (Addendum)
She is not symptomatic but I am concerned about increased risk of heart attack and stroke given her other major risk factors such as diabetes and hypertension. Additional workup showed mildly elevated sedimentation rate, nonspecific. Peripheral blood for JAK2 mutation, MPL and CAL-R are negative. We discussed the risk and benefit of bone marrow aspirate and biopsy but the patient declined to test Essential thrombocytosis cannot be excluded.  Her son would like observation only at this point I will order additional tests with BCR/ABL by fish in her next visit to exclude CML although this is unlikely In the meantime, I reinforced the importance of taking aspirin

## 2016-10-20 NOTE — Assessment & Plan Note (Signed)
Most likely cause is anemia of chronic illness. In the meantime, she will continue vitamin B-12 supplement.

## 2016-10-20 NOTE — Assessment & Plan Note (Signed)
Serum protein electrophoresis detected minor component of IgG lambda, unknown significance I plan to repeat the blood work again in 6 months

## 2016-10-27 ENCOUNTER — Other Ambulatory Visit: Payer: Self-pay | Admitting: Family Medicine

## 2016-11-26 ENCOUNTER — Ambulatory Visit: Payer: Medicare Other | Admitting: Podiatry

## 2017-01-08 ENCOUNTER — Other Ambulatory Visit: Payer: Self-pay | Admitting: Family Medicine

## 2017-02-18 ENCOUNTER — Other Ambulatory Visit: Payer: Self-pay | Admitting: Family Medicine

## 2017-04-06 ENCOUNTER — Other Ambulatory Visit: Payer: Self-pay | Admitting: Family Medicine

## 2017-04-13 ENCOUNTER — Inpatient Hospital Stay: Payer: Medicare Other | Attending: Hematology and Oncology

## 2017-04-13 DIAGNOSIS — E785 Hyperlipidemia, unspecified: Secondary | ICD-10-CM | POA: Insufficient documentation

## 2017-04-13 DIAGNOSIS — E1122 Type 2 diabetes mellitus with diabetic chronic kidney disease: Secondary | ICD-10-CM | POA: Insufficient documentation

## 2017-04-13 DIAGNOSIS — D631 Anemia in chronic kidney disease: Secondary | ICD-10-CM | POA: Insufficient documentation

## 2017-04-13 DIAGNOSIS — N183 Chronic kidney disease, stage 3 (moderate): Secondary | ICD-10-CM | POA: Insufficient documentation

## 2017-04-13 DIAGNOSIS — Z87891 Personal history of nicotine dependence: Secondary | ICD-10-CM | POA: Insufficient documentation

## 2017-04-13 DIAGNOSIS — Z79899 Other long term (current) drug therapy: Secondary | ICD-10-CM | POA: Insufficient documentation

## 2017-04-13 DIAGNOSIS — E538 Deficiency of other specified B group vitamins: Secondary | ICD-10-CM | POA: Insufficient documentation

## 2017-04-13 DIAGNOSIS — I129 Hypertensive chronic kidney disease with stage 1 through stage 4 chronic kidney disease, or unspecified chronic kidney disease: Secondary | ICD-10-CM | POA: Insufficient documentation

## 2017-04-13 DIAGNOSIS — D473 Essential (hemorrhagic) thrombocythemia: Secondary | ICD-10-CM | POA: Insufficient documentation

## 2017-04-20 ENCOUNTER — Ambulatory Visit: Payer: Medicare Other | Admitting: Hematology and Oncology

## 2017-04-22 ENCOUNTER — Encounter: Payer: Self-pay | Admitting: Podiatry

## 2017-04-22 ENCOUNTER — Ambulatory Visit (INDEPENDENT_AMBULATORY_CARE_PROVIDER_SITE_OTHER): Payer: Medicare Other | Admitting: Podiatry

## 2017-04-22 DIAGNOSIS — B351 Tinea unguium: Secondary | ICD-10-CM

## 2017-04-22 DIAGNOSIS — E119 Type 2 diabetes mellitus without complications: Secondary | ICD-10-CM

## 2017-04-22 NOTE — Progress Notes (Signed)
Patient ID: Candace Long, female   DOB: 1946-06-22, 71 y.o.   MRN: [id number] Complaint:  Visit Type: Patient returns to my office for continued preventative foot care services. Complaint: Patient states" my nails have grown long and thick and become painful to walk and wear shoes" Patient has been diagnosed with DM with no foot  complications. He presents for preventative foot care services. No changes to ROS  Podiatric Exam: Vascular: dorsalis pedis and posterior tibial pulses are palpable bilateral. Capillary return is immediate. Temperature gradient is WNL. Skin turgor WNL  Sensorium: Normal Semmes Weinstein monofilament test. Normal tactile sensation bilaterally. Nail Exam: Pt has thick disfigured discolored nails with subungual debris noted bilateral entire nail hallux through fifth toenails Ulcer Exam: There is no evidence of ulcer or pre-ulcerative changes or infection. Orthopedic Exam: Muscle tone and strength are WNL. No limitations in general ROM. No crepitus or effusions noted. Foot type and digits show no abnormalities. Bony prominences are unremarkable. Skin: No Porokeratosis. No infection or ulcers  Diagnosis:  Tinea unguium, Pain in right toe, pain in left toes  Treatment & Plan Procedures and Treatment: Consent by patient was obtained for treatment procedures. The patient understood the discussion of treatment and procedures well. All questions were answered thoroughly reviewed. Debridement of mycotic and hypertrophic toenails, 1 through 5 bilateral and clearing of subungual debris. No ulceration, no infection noted.  Return Visit-Office Procedure: Patient instructed to return to the office for a follow up visit 3 months for continued evaluation and treatment.  Gardiner Barefoot DPM

## 2017-04-28 ENCOUNTER — Encounter: Payer: Self-pay | Admitting: Hematology and Oncology

## 2017-04-28 ENCOUNTER — Inpatient Hospital Stay: Payer: Medicare Other

## 2017-04-28 ENCOUNTER — Inpatient Hospital Stay (HOSPITAL_BASED_OUTPATIENT_CLINIC_OR_DEPARTMENT_OTHER): Payer: Medicare Other | Admitting: Hematology and Oncology

## 2017-04-28 VITALS — BP 153/94 | HR 77 | Temp 97.3°F | Resp 18 | Wt 233.0 lb

## 2017-04-28 DIAGNOSIS — E785 Hyperlipidemia, unspecified: Secondary | ICD-10-CM | POA: Diagnosis not present

## 2017-04-28 DIAGNOSIS — E538 Deficiency of other specified B group vitamins: Secondary | ICD-10-CM | POA: Diagnosis not present

## 2017-04-28 DIAGNOSIS — N183 Chronic kidney disease, stage 3 unspecified: Secondary | ICD-10-CM

## 2017-04-28 DIAGNOSIS — Z79899 Other long term (current) drug therapy: Secondary | ICD-10-CM | POA: Diagnosis not present

## 2017-04-28 DIAGNOSIS — I129 Hypertensive chronic kidney disease with stage 1 through stage 4 chronic kidney disease, or unspecified chronic kidney disease: Secondary | ICD-10-CM | POA: Diagnosis not present

## 2017-04-28 DIAGNOSIS — D631 Anemia in chronic kidney disease: Secondary | ICD-10-CM | POA: Diagnosis not present

## 2017-04-28 DIAGNOSIS — E1122 Type 2 diabetes mellitus with diabetic chronic kidney disease: Secondary | ICD-10-CM | POA: Diagnosis not present

## 2017-04-28 DIAGNOSIS — D473 Essential (hemorrhagic) thrombocythemia: Secondary | ICD-10-CM

## 2017-04-28 DIAGNOSIS — D649 Anemia, unspecified: Secondary | ICD-10-CM

## 2017-04-28 DIAGNOSIS — D75839 Thrombocytosis, unspecified: Secondary | ICD-10-CM

## 2017-04-28 DIAGNOSIS — Z87891 Personal history of nicotine dependence: Secondary | ICD-10-CM | POA: Diagnosis not present

## 2017-04-28 DIAGNOSIS — D472 Monoclonal gammopathy: Secondary | ICD-10-CM | POA: Insufficient documentation

## 2017-04-28 LAB — IRON AND TIBC
Iron: 68 ug/dL (ref 28–170)
Saturation Ratios: 19 % (ref 10.4–31.8)
TIBC: 354 ug/dL (ref 250–450)
UIBC: 287 ug/dL

## 2017-04-28 LAB — FOLATE: Folate: 17.4 ng/mL (ref >5.9)

## 2017-04-28 LAB — FERRITIN: Ferritin: 88 ng/mL (ref 11–307)

## 2017-04-28 NOTE — Progress Notes (Signed)
[facility] day:  04/28/2017  Chief Complaint: Candace Long is a 71 y.o. 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  HPI:  The patient was noted to have an abnormal labs on surveillance blood work.  Review of available CBCs dating back to 05/24/2014 revealed a platelet count ranging between 534,000 - 601,000.  She also has a history of a normocytic anemia and an abnormal SPEP.  SPEP on 06/10/2016 revealed a poorly defined band of restricted protein mobility in the gamma globulins Marlowe Aschoff represents a monoclonal gammopathy).  B12 on 05/27/2015 was 223.  She was started on oral B12.  She continues to take oral B12.  She denies history of thrombosis.  She does not consistently take aspirin therapy.  She denies any issues with recurrent infections.  She denies any bleeding such as spontaneous epistaxis, hematuria or hematochezia.  CBC on 05/31/2016 included a hematocrit of 35, hemoglobin 11.4, MCV 87.1, platelets 554,000, white count 10,000 with an ANC of 7100. Differential was normal.  She was seen in the hematology clinic on 09/29/2016 by Dr. Alvy Bimler.  CBC revealed a hematocrit 34.9, hemoglobin 11.6, MCV 85.9, platelets 511,000, white count 9100 with an ANC of 6100.  Reticulocyte count was 1.6%.  Ferritin was 99.  Sedimentation rate was 48.  Iron studies included a saturation of 18% and a TIBC of 322.  JAK2 V617F was negative.  Reflex CALR/MPL was not performed.  Kappa free light chains were 27.9, lambda free light chains 21.7 with a ratio of 1.29 (normal).  SPEP revealed no monoclonal protein. Immunofixation revealed an IgG monoclonal protein with lambda light chain specificity. Immunoglobulin levels were normal.  Diet is poor according to her son. She eats sandwiches and things easy to fix. She states that she eats meat daily.  She rarely eats green vegetables.  Symptomatically, she denies any complaint.  She denies any headache, visual  changes or erythromelalgia.  She denies any bruising or bleeding. She denies any lower extremity pain or swelling.  She denies any shortness of breath.   Past Medical History:  Diagnosis Date  . Chronic kidney disease, stage III (moderate) 05/24/2014  . HLD (hyperlipidemia)   . Hypertension   . Obesity   . S/P cerebral aneurysm repair 1993   some residual memory impairment  . Uncontrolled type 2 diabetes mellitus with nephropathy [facility])    DSME [facility] 06/2014, did not keep f/u classes so discharged    Past Surgical History:  Procedure Laterality Date  . CARDIOVASCULAR STRESS TEST  12/2012   WNL EF 55% (Harwani)  . COLONOSCOPY  07/2014   1 tubular adenoma, severe diverticulosis, rpt 5 yrs [facility])  . CRANIOTOMY  1993   cerebral aneurysm repair  . VENTRICULOPERITONEAL SHUNT  1993    Family History  Problem Relation Age of Onset  . Hypertension Mother   . Diabetes Mother   . Diabetes Sister   . Heart failure Sister   . CAD Neg Hx   . Stroke Neg Hx   . Cancer Neg Hx   . Colon cancer Neg Hx   . Esophageal cancer Neg Hx   . Rectal cancer Neg Hx   . Stomach cancer Neg Hx     Social History:  reports that she quit smoking about 8 years ago. Her smoking use included Cigarettes. She smoked 1.00 pack per day. She quit smokeless tobacco use about 6 years ago. Her smokeless tobacco use included Snuff. She reports that she  does not drink alcohol or use drugs.  The patient is accompanied by her son, Juanda Crumble, today.  Allergies:  Allergies  Allergen Reactions  . Hydrochlorothiazide Other (See Comments)    Hypokalemia, hyponatremia    Current Medications: Current Outpatient Prescriptions  Medication Sig Dispense Refill  . albuterol (VENTOLIN HFA) 108 (90 Base) MCG/ACT inhaler Inhale 2 puffs into the lungs every 6 (six) hours as needed for wheezing or shortness of breath. 1 Inhaler 3  . amLODipine (NORVASC) 5 MG tablet TAKE 1 TABLET (5 MG TOTAL) BY MOUTH DAILY. 90 tablet 1  . aspirin EC  81 MG tablet Take 81 mg by mouth daily.     Marland Kitchen atorvastatin (LIPITOR) 40 MG tablet TAKE 1 TABLET (40 MG TOTAL) BY MOUTH DAILY. 90 tablet 2  . Cholecalciferol (VITAMIN D) 1000 UNITS capsule Take 1 capsule (1,000 Units total) by mouth daily.    . Insulin Pen Needle (B-D ULTRAFINE III SHORT PEN) 31G X 8 MM MISC Use to inject Lantus once daily. 100 each 3  . KLOR-CON M20 20 MEQ tablet TAKE 1 TABLET (20 MEQ TOTAL) BY MOUTH 2 (TWO) TIMES DAILY. 60 tablet 1  . LANTUS SOLOSTAR 100 UNIT/ML Solostar Pen INJECT 30 UNITS INTO THE SKIN DAILY. 15 pen 3  . lisinopril (PRINIVIL,ZESTRIL) 40 MG tablet Take 1 tablet (40 mg total) by mouth daily. 90 tablet 3  . metFORMIN (GLUCOPHAGE) 1000 MG tablet TAKE 1 TABLET (1,000 MG TOTAL) BY MOUTH 2 (TWO) TIMES DAILY WITH A MEAL. 180 tablet 2  . ondansetron (ZOFRAN) 8 MG tablet Take 1 tablet (8 mg total) by mouth every 8 (eight) hours as needed for nausea or vomiting. 20 tablet 0  . vitamin B-12 (CYANOCOBALAMIN) 1000 MCG tablet Take 1 tablet (1,000 mcg total) by mouth daily.     No current facility-administered medications for this visit.     Review of Systems:  GENERAL:  Feels good. No fevers, sweats or weight loss. PERFORMANCE STATUS (ECOG):  1 HEENT:  No visual changes, runny nose, sore throat, mouth sores or tenderness. Lungs: No shortness of breath or cough.  No hemoptysis. Cardiac:  No chest pain, palpitations, orthopnea, or PND. GI:  No nausea, vomiting, diarrhea, constipation, melena or hematochezia. GU:  No urgency, frequency, dysuria, or hematuria. Musculoskeletal:  No back pain.  No joint pain.  No muscle tenderness. Extremities:  No pain or swelling. Skin:  No rashes or skin changes. Neuro:  No headache, numbness or weakness, balance or coordination issues. Endocrine:  No diabetes, thyroid issues, hot flashes or night sweats. Psych:  No mood changes, depression or anxiety. Pain:  No focal pain. Review of systems:  All other systems reviewed and found to be  negative.  Physical Exam: Blood pressure (!) 155/94, pulse 85, temperature 97.3 F (36.3 C), temperature source Tympanic, resp. rate 18, weight 233 lb (105.7 kg). GENERAL:  Well developed, well nourished, woman sitting comfortably in the exam room in no acute distress. MENTAL STATUS:  Alert and oriented to person, place and time. HEAD:  Black hair pulled back.  Normocephalic, atraumatic, face symmetric, no Cushingoid features. EYES:  Brown eyes.  Pupils equal round and reactive to light and accomodation.  No conjunctivitis or scleral icterus. ENT:  Oropharynx clear without lesion.  Tongue normal.  No upper teeth.  Mucous membranes moist.  RESPIRATORY:  Clear to auscultation without rales, wheezes or rhonchi. CARDIOVASCULAR:  Regular rate and rhythm without murmur, rub or gallop. ABDOMEN:  Soft, non-tender, with active bowel sounds, and  no hepatosplenomegaly.  No masses. SKIN:  No rashes, ulcers or lesions. EXTREMITIES: No edema, no skin discoloration or tenderness.  No palpable cords. LYMPH NODES: No palpable cervical, supraclavicular, axillary or inguinal adenopathy  NEUROLOGICAL: Unremarkable. PSYCH:  Appropriate.   No visits with results within 3 Day(s) from this visit.  Latest known visit with results is:  Clinical Support on 09/29/2016  Component Date Value Ref Range Status  . Ferritin 09/29/2016 99  11 - 307 ng/mL Final  . Sed Rate 09/29/2016 48* 0 - 30 mm/hr Final  . Iron 09/29/2016 57  28 - 170 ug/dL Final  . TIBC 09/29/2016 322  250 - 450 ug/dL Final  . Saturation Ratios 09/29/2016 18  10.4 - 31.8 % Final  . UIBC 09/29/2016 265  ug/dL Final  . JAK2 GenotypR 09/29/2016 Comment   Final   Comment: (NOTE) Result: NEGATIVE for the JAK2 V617F mutation. Interpretation:  The G to T nucleotide change encoding the V617F mutation was not detected.  This result does not rule out the presence of the JAK2 mutation at a level below the sensitivity of detection of this assay, or the  presence of other mutations within JAK2 not detected by this assay.  This result does not rule out a diagnosis of polycythemia vera, essential thrombocythemia or idiopathic myelofibrosis as the V617F mutation is not detected in all patients with these disorders.   Marland Kitchen BACKGROUND: 09/29/2016 Comment   Final   Comment: (NOTE) JAK2 is a cytoplasmic tyrosine kinase with a key role in signal transduction from multiple hematopoietic growth factor receptors. A point mutation within exon 14 of the JAK2 gene ([id number]) encoding a valine to phenylalanine substitution at position 617 of the JAK2 protein (V617F) has been identified in most patients with polycythemia vera, and in about half of those with either essential thrombocythemia or idiopathic myelofibrosis. The V617F has also been detected, although infrequently, in other myeloid disorders such as chronic myelomonocytic leukemia and chronic neutrophilic luekemia. V617F is an acquired mutation that alters a highly conserved valine present in the negative regulatory JH2 domain of the JAK2 protein and is predicted to dysregulate kinase activity. Methodology: Total genomic DNA was extracted and subjected to TaqMan real-time PCR amplification/detection. Two amplification products per sample were monitored by real-time PCR using primers/probes s                          pecific to JAK2 wild type (WT) and JAK2 mutant V617F. The ABI7900 Absolute Quantitation software will compare the patient specimen valuse to the standard curves and generate percent values for wild type and mutant type. In vitro studies have indicated that this assay has an analytical sensitivity of 1%. References: Baxter EJ, Scott Phineas Real, et al. Acquired mutation of the tyrosine kinase JAK2 in human myeloproliferative disorders. Lancet. 2005 Mar 19-25; 365(9464):1054-1061. Alfonso Ramus Couedic JP. A unique clonal JAK2 mutation leading to constitutive signaling  causes polycythaemia vera. Nature. 2005 Apr 28; 434(7037):1144-1148. Kralovics R, Passamonti F, Buser AS, et al. A gain-of-function mutation of JAK2 in myeloproliferative disorders. N Engl J Med. 2005 Apr 28; 352(17):1779-1790.   . Director Review, JAK2 09/29/2016 Comment   Final   Comment: (NOTE) Constance Goltz, PhD, [facility]               Director, [facility]  Biology [facility] Pathology               [place], [place]   . Reflex: 09/29/2016 Comment   Corrected   Comment: (NOTE) Reflex to CALR Mutation Analysis and MPL Mutation Analysis is indicated. Performed At: [facility] [address] [place], [place] [id number] Nechama Guard MD ER:[id number] Performed At: [facility] [address] [place], [place] [id number] Nechama Guard MD IW:[id number]   . WBC 09/29/2016 9.1  3.6 - 11.0 K/uL Final  . RBC 09/29/2016 4.06  3.80 - 5.20 MIL/uL Final  . Hemoglobin 09/29/2016 11.6* 12.0 - 16.0 g/dL Final  . HCT 09/29/2016 34.9* 35.0 - 47.0 % Final  . MCV 09/29/2016 85.9  80.0 - 100.0 fL Final  . MCH 09/29/2016 28.5  26.0 - 34.0 pg Final  . MCHC 09/29/2016 33.2  32.0 - 36.0 g/dL Final  . RDW 09/29/2016 13.7  11.5 - 14.5 % Final  . Platelets 09/29/2016 511* 150 - 440 K/uL Final  . Neutrophils Relative % 09/29/2016 66  % Final  . Neutro Abs 09/29/2016 6.1  1.4 - 6.5 K/uL Final  . Lymphocytes Relative 09/29/2016 24  % Final  . Lymphs Abs 09/29/2016 2.2  1.0 - 3.6 K/uL Final  . Monocytes Relative 09/29/2016 6  % Final  . Monocytes Absolute 09/29/2016 0.5  0.2 - 0.9 K/uL Final  . Eosinophils Relative 09/29/2016 3  % Final  . Eosinophils Absolute 09/29/2016 0.3  0 - 0.7 K/uL Final  . Basophils Relative 09/29/2016 1  % Final  . Basophils Absolute 09/29/2016 0.1  0 - 0.1 K/uL Final  . Retic Ct Pct 09/29/2016 1.6  0.4 - 3.1 % Final  . RBC. 09/29/2016 4.06  3.80 - 5.20 MIL/uL Final   . Retic Count, Manual 09/29/2016 65.0  19.0 - 183.0 K/uL Final  . Kappa free light chain 09/29/2016 27.9* 3.3 - 19.4 mg/L Final  . Lamda free light chains 09/29/2016 21.7  5.7 - 26.3 mg/L Final  . Kappa, lamda light chain ratio 09/29/2016 1.29  0.26 - 1.65 Final   Comment: (NOTE) Performed At: [facility] [place], [place] [id number] Lindon Romp MD AL:[id number]   . IgG (Immunoglobin G), Serum 09/29/2016 930  700 - 1,600 mg/dL Final  . IgA 09/29/2016 113  87 - 352 mg/dL Final  . IgM, Serum 09/29/2016 108  26 - 217 mg/dL Final  . Total Protein ELP 09/29/2016 6.5  6.0 - 8.5 g/dL Corrected  . Albumin SerPl Elph-Mcnc 09/29/2016 3.6  2.9 - 4.4 g/dL Corrected  . Alpha 1 09/29/2016 0.2  0.0 - 0.4 g/dL Corrected  . Alpha2 Glob SerPl Elph-Mcnc 09/29/2016 0.8  0.4 - 1.0 g/dL Corrected  . B-Globulin SerPl Elph-Mcnc 09/29/2016 1.0  0.7 - 1.3 g/dL Corrected  . Gamma Glob SerPl Elph-Mcnc 09/29/2016 0.9  0.4 - 1.8 g/dL Corrected  . M Protein SerPl Elph-Mcnc 09/29/2016 Not Observed  Not Observed g/dL Corrected  . Globulin, Total 09/29/2016 2.9  2.2 - 3.9 g/dL Corrected  . Albumin/Glob SerPl 09/29/2016 1.3  0.7 - 1.7 Corrected  . IFE 1 09/29/2016 Comment   Corrected   Comment: (NOTE) Immunofixation shows IgG monoclonal protein with lambda light chain specificity.   . Please Note 09/29/2016 Comment   Corrected   Comment: (NOTE) Protein electrophoresis scan will follow via computer, mail, or courier delivery. Performed At:  [facility] [place], [place] [id number] Lindon Romp MD IZ:[id number]   . CALR Mutation Detection Result 09/29/2016 Comment   Final   Comment: (NOTE) NEGATIVE No insertions or deletions were detected within the analyzed region of the calreticulin (CALR) gene. A negative result does not entirely exclude the possibility of a clonal population carrying CALR gene mutations that are not covered by this assay. Results should  be interpreted in conjunction with clinical and laboratory findings for the most accurate interpretation.   . Background: 09/29/2016 Comment   Final   Comment: (NOTE) The calcium-binding endoplasmic reticulin chaperone protein, calreticulin (CALR), is somatically mutated in approximately 70% of patients with JAK2-negative essential thrombocythemia (ET) and 60- 88% of patients with JAK2-negative primary myelofibrosis. Only a minority of patients (approximately 8%) with myelodysplasia have mutations in CALR gene. CALR mutations are rarely detected in patients with de novo acute myeloid leukemia, chronic myeloid leukemia, lymphoid leukemia, or solid tumors. CALR mutations are not detected in polycythemia and and appear to be mutually exclusive with JAK2 mutations and MPL mutations. The majority of mutational changes involve a variety of insertion or deletion mutations in exon 9 of the calreticulin gene: approximately 53% of all CALR mutations are a 52 bp deletion while the second most prevalent mutation (approximately 32%) contains a 5 bp insertion. The remaining mutations consist of mostly deletions ranging from 1 to 52 bp and 1                           or 2 insertion mutations. The detection of a CALR gene mutation aids in the specific diagnosis of a myeloproliferative neoplasm, and help distinguish this clonal disease from a benign reactive process. The presence of a CALR gene mutation may predict a more indolent disease course with a lower thrombotic risk and longer overall survival (relative to those with a JAK2 mutation).   . Methodology: 09/29/2016 Comment   Final   Comment: (NOTE) Genomic DNA was isolated from the provided specimen. Polymerase chain reaction (PCR) of exon 9 of the CALR gene was performed with specific fluorescent-labeled primers, and the PCR product was analyzed by capillary gel electrophoresis to determine the size of the PCR products. This PCR assay is  capable of detecting a mutant cell population with a sensitivity of 5 mutant cells per 100 normal cells. A negative result does not exclude the presence of a myeloproliferative disorder or other neoplastic process. This test was developed and its performance characteristics determined by [facility]. It has not been cleared or approved by the Food and Drug Administration. The FDA has determined that such clearance or approval is not necessary.   . References: 09/29/2016 Comment   Final   Comment: (NOTE) 1. Klampfel, T. et al. (2013) Somatic mutations of calreticulin in   myeloproliferative neoplasms. New Engl. J. Med. 382:5053-9767. 2. Haynes Kerns et al. (2013) Somatic CALR mutations in   myeloproliferative neoplasms with nonmutated JAK2. [place] Engl. J.   Med. [phone].   Marland Kitchen Director Review 09/29/2016 Comment   Final   Comment: (NOTE) Katina Degree, MD, PhD Director, [facility] Pathology [place], [place] [address] [phone]   . MPL MUTATION ANALYSIS RESULT: 09/29/2016 Comment   Final   Comment: (NOTE) No MPL mutation was identified in the provided specimen of this individual. Results should be interpreted in conjunction with clinical and other laboratory findings for the most accurate interpretation.   Marland Kitchen  BACKGROUND: 09/29/2016 Comment   Final   Comment: (NOTE) MPL (myeloproliferative leukemia virus oncogene homology) belongs to the hematopoietin superfamily and enables its ligand thrombopoietin to facilitate both global hematopoiesis and megakaryocyte growth and differentiation. MPL W515 mutations are present in patients with primary myelofibrosis (PMF) and essential thrombocythemia (ET) at a frequency of approximately 5% and 1% respectively. The S505 mutation is detected in patients with hereditary thrombocythemia.   Marland Kitchen METHODOLOGY: 09/29/2016 Comment   Final   Comment: (NOTE) Genomic DNA was purified from the  provided specimen. MPL gene region covering the S505N and W515L/K mutations were subjected to PCR amplification and bi-directional sequencing in duplicate to identify sequence variations. This assay has a sensitivity to detect approximately 20-25% population of cells containing the MPL mutations in a background of non-mutant cells. This assay will not detect the mutation below the sensitivity of this assay. Molecular- based testing is highly accurate, but as in any laboratory test, rare diagnostic errors may occur.   Marland Kitchen REFERENCES: 09/29/2016 Comment   Final   Comment: (NOTE) 1. Pardanani AD, et al. (2006). MPL515 mutations in   myeloproliferative and other myeloid disorders: a study   of 1182 patients. Blood [phone]. 2. Andre Lefort and Levine RL. (2008). JAK2 and MPL   mutations in myeloproliferative neoplasms: discovery and   science. Leukemia 22:1813-1817. 3. Juline Patch, et al. (2009). Evidence for a founder effect   of the MPL-S505N mutation in eight [place] pedigrees with   hereditary thrombocythemia. Haematologica 94(10):1368-   0973.   Marland Kitchen DIRECTOR REVIEW: 09/29/2016 Comment   Final   Comment: (NOTE) Constance Goltz, PhD, [facility]               Director, [place] for Molecular               Biology and [place], [place]               [phone] Performed At: [facility] [place], [place] [id number] Nechama Guard MD ST:[id number] Performed At: [facility] [address] [place], [place] [id number] Nechama Guard MD EY:[id number]     Assessment:  Shane Melby is a 71 y.o. female with thrombocytosis.  CBCs dating back to 05/24/2014 reveal a platelet count ranging between 534,000 - 601,000.    She also has a history of a normocytic anemia and an abnormal SPEP.  SPEP on 06/10/2016 revealed a poorly defined band of restricted protein mobility in the gamma globulins  Marlowe Aschoff represents a monoclonal gammopathy).    She has B12 deficiency.  B12 on 05/27/2015 was 223.  She was started on oral B12.   Work-up on 09/29/2016 revealed the following: hematocrit 34.9, hemoglobin 11.6, MCV 85.9, platelets 511,000, white count 9100 with an ANC of 6100.  JAK2 V617F was negative.  Reflex CALR/MPL was not performed. Retic was 1.6%.  Ferritin was 99.  Sedimentation rate was 48.  Iron studies included a saturation of 18% and a TIBC of 322.  JAK2 V617F was negative.  Free light chain ratio was 1.29 (normal).  SPEP revealed no monoclonal protein.  Immunofixation revealed an IgG monoclonal protein with lambda light chain specificity. Immunoglobulin levels were normal.  Symptomatically, she denies any complaint.  Exam reveals no adenopathy or hepatosplenomegaly.  Plan: 1.  Discuss work-up from  last visit.  Thrombocytosis appears reactive.  JAK2 with reflex to MPL/CALR make ET unlikely.  Previously she had declined bone marrow.  Check BCR-ABL (doubt CML).  Discuss working diagnosis of anemia of chronic disease.  Discuss unclear significance of monoclonal protein detected only by immunofixation.  Discuss repeat SPEP today and free light chains.  Discuss 24 hour urine for UPEP. 2.  Labs today:  CBC with diff, CMP, folate, ferritin, iron studies, SPEP, free light chains, sed rate, BCR-ABL. 3.  Collect 24 hour urine for UPEP. 4.  RTC in 2 weeks for MD assessment and review of testing.   Lequita Asal, MD  04/28/2017, 11:11 AM

## 2017-04-28 NOTE — Progress Notes (Signed)
Patient offers no complaints today.  Saw Dr. Alvy Bimler at last visit.  Advised patient to call PCP with BP values from today.  Patient is unsure if she has Norvasc.  Chart indicates she is taking 5 mg daily and when medications were reconciled, she told me she was taking it.  Son is with patient today.  Spoke with him as well and they both verbalized they will call PCP.

## 2017-05-12 ENCOUNTER — Inpatient Hospital Stay: Payer: Medicare Other

## 2017-05-12 ENCOUNTER — Inpatient Hospital Stay (HOSPITAL_BASED_OUTPATIENT_CLINIC_OR_DEPARTMENT_OTHER): Payer: Medicare Other | Admitting: Hematology and Oncology

## 2017-05-12 VITALS — BP 145/87 | HR 94 | Temp 97.6°F | Resp 18 | Wt 240.3 lb

## 2017-05-12 DIAGNOSIS — E538 Deficiency of other specified B group vitamins: Secondary | ICD-10-CM

## 2017-05-12 DIAGNOSIS — D75839 Thrombocytosis, unspecified: Secondary | ICD-10-CM

## 2017-05-12 DIAGNOSIS — D631 Anemia in chronic kidney disease: Secondary | ICD-10-CM | POA: Diagnosis not present

## 2017-05-12 DIAGNOSIS — I129 Hypertensive chronic kidney disease with stage 1 through stage 4 chronic kidney disease, or unspecified chronic kidney disease: Secondary | ICD-10-CM

## 2017-05-12 DIAGNOSIS — D472 Monoclonal gammopathy: Secondary | ICD-10-CM

## 2017-05-12 DIAGNOSIS — D473 Essential (hemorrhagic) thrombocythemia: Secondary | ICD-10-CM

## 2017-05-12 DIAGNOSIS — N183 Chronic kidney disease, stage 3 (moderate): Secondary | ICD-10-CM | POA: Diagnosis not present

## 2017-05-12 DIAGNOSIS — Z79899 Other long term (current) drug therapy: Secondary | ICD-10-CM

## 2017-05-12 LAB — COMPREHENSIVE METABOLIC PANEL
ALT: 19 U/L (ref 14–54)
AST: 27 U/L (ref 15–41)
Albumin: 3.9 g/dL (ref 3.5–5.0)
Alkaline Phosphatase: 102 U/L (ref 38–126)
Anion gap: 9 (ref 5–15)
BUN: 17 mg/dL (ref 6–20)
CO2: 26 mmol/L (ref 22–32)
Calcium: 9.3 mg/dL (ref 8.9–10.3)
Chloride: 96 mmol/L — ABNORMAL LOW (ref 101–111)
Creatinine, Ser: 1.34 mg/dL — ABNORMAL HIGH (ref 0.44–1.00)
GFR calc Af Amer: 46 mL/min — ABNORMAL LOW (ref >60)
GFR calc non Af Amer: 40 mL/min — ABNORMAL LOW (ref >60)
Glucose, Bld: 96 mg/dL (ref 65–99)
Potassium: 3.8 mmol/L (ref 3.5–5.1)
Sodium: 131 mmol/L — ABNORMAL LOW (ref 135–145)
Total Bilirubin: 0.6 mg/dL (ref 0.3–1.2)
Total Protein: 7.2 g/dL (ref 6.5–8.1)

## 2017-05-12 LAB — CBC WITH DIFFERENTIAL/PLATELET
Basophils Absolute: 0.1 10*3/uL (ref 0–0.1)
Basophils Relative: 1 %
Eosinophils Absolute: 0.2 10*3/uL (ref 0–0.7)
Eosinophils Relative: 2 %
HCT: 34.3 % — ABNORMAL LOW (ref 35.0–47.0)
Hemoglobin: 11.4 g/dL — ABNORMAL LOW (ref 12.0–16.0)
Lymphocytes Relative: 24 %
Lymphs Abs: 2 10*3/uL (ref 1.0–3.6)
MCH: 28.8 pg (ref 26.0–34.0)
MCHC: 33.4 g/dL (ref 32.0–36.0)
MCV: 86.3 fL (ref 80.0–100.0)
Monocytes Absolute: 0.4 10*3/uL (ref 0.2–0.9)
Monocytes Relative: 5 %
Neutro Abs: 5.7 10*3/uL (ref 1.4–6.5)
Neutrophils Relative %: 68 %
Platelets: 583 10*3/uL — ABNORMAL HIGH (ref 150–440)
RBC: 3.97 MIL/uL (ref 3.80–5.20)
RDW: 14.1 % (ref 11.5–14.5)
WBC: 8.4 10*3/uL (ref 3.6–11.0)

## 2017-05-12 NOTE — Progress Notes (Signed)
Patient offers no complaints today. 

## 2017-05-12 NOTE — Progress Notes (Signed)
[facility] day:  05/12/2017  Chief Complaint: Candace Long is a 71 y.o. female with thrombocytosis who is seen for 2 week assessment and review of interval testing.  HPI:  The patient was last seen in the hematology clinic on 04/28/2017.  At that time, she was seen for initial assessment by me.  She had throbocytosis dating back to 05/2014.  Platelet count ranged between 534,000 - 601,000.  She was on oral B12 for documented B12 deficiency.  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 Immunoglobulin levels were normal.  At last visit, we discussed follow-up testing.  Several labs were inadvertently not done.  Ferritin was 88. Iron studies included a saturation of 19% a TIBC of 354. Folate was 17.4.  Symptomatically, she feels "fine".  Diet remains modest.   Past Medical History:  Diagnosis Date  . Chronic kidney disease, stage III (moderate) 05/24/2014  . HLD (hyperlipidemia)   . Hypertension   . Obesity   . S/P cerebral aneurysm repair 1993   some residual memory impairment  . Uncontrolled type 2 diabetes mellitus with nephropathy [facility])    DSME [facility] 06/2014, did not keep f/u classes so discharged    Past Surgical History:  Procedure Laterality Date  . CARDIOVASCULAR STRESS TEST  12/2012   WNL EF 55% (Harwani)  . COLONOSCOPY  07/2014   1 tubular adenoma, severe diverticulosis, rpt 5 yrs [facility])  . CRANIOTOMY  1993   cerebral aneurysm repair  . VENTRICULOPERITONEAL SHUNT  1993    Family History  Problem Relation Age of Onset  . Hypertension Mother   . Diabetes Mother   . Diabetes Sister   . Heart failure Sister   . CAD Neg Hx   . Stroke Neg Hx   . Cancer Neg Hx   . Colon cancer Neg Hx   . Esophageal cancer Neg Hx   . Rectal cancer Neg Hx   . Stomach cancer Neg Hx     Social History:  reports that she quit smoking about 8 years ago. Her  smoking use included Cigarettes. She smoked 1.00 pack per day. She quit smokeless tobacco use about 6 years ago. Her smokeless tobacco use included Snuff. She reports that she does not drink alcohol or use drugs.  Her son's name is Juanda Crumble.  The patient is accompanied by her sister, Arne Cleveland, today.  Allergies:  Allergies  Allergen Reactions  . Hydrochlorothiazide Other (See Comments)    Hypokalemia, hyponatremia    Current Medications: Current Outpatient Prescriptions  Medication Sig Dispense Refill  . albuterol (VENTOLIN HFA) 108 (90 Base) MCG/ACT inhaler Inhale 2 puffs into the lungs every 6 (six) hours as needed for wheezing or shortness of breath. 1 Inhaler 3  . amLODipine (NORVASC) 10 MG tablet Take 10 mg by mouth daily.  2  . aspirin EC 81 MG tablet Take 81 mg by mouth daily.     Marland Kitchen atorvastatin (LIPITOR) 40 MG tablet TAKE 1 TABLET (40 MG TOTAL) BY MOUTH DAILY. 90 tablet 2  . Cholecalciferol (VITAMIN D) 1000 UNITS capsule Take 1 capsule (1,000 Units total) by mouth daily.    . Insulin Pen Needle (B-D ULTRAFINE III SHORT PEN) 31G X 8 MM MISC Use to inject Lantus once daily. 100 each 3  . KLOR-CON M20 20 MEQ tablet TAKE 1 TABLET (20 MEQ TOTAL) BY MOUTH 2 (TWO) TIMES DAILY. 60 tablet 1  .  LANTUS SOLOSTAR 100 UNIT/ML Solostar Pen INJECT 30 UNITS INTO THE SKIN DAILY. 15 pen 3  . lisinopril (PRINIVIL,ZESTRIL) 40 MG tablet Take 1 tablet (40 mg total) by mouth daily. 90 tablet 3  . metFORMIN (GLUCOPHAGE) 1000 MG tablet TAKE 1 TABLET (1,000 MG TOTAL) BY MOUTH 2 (TWO) TIMES DAILY WITH A MEAL. 180 tablet 2  . ondansetron (ZOFRAN) 8 MG tablet Take 1 tablet (8 mg total) by mouth every 8 (eight) hours as needed for nausea or vomiting. 20 tablet 0  . vitamin B-12 (CYANOCOBALAMIN) 1000 MCG tablet Take 1 tablet (1,000 mcg total) by mouth daily.     No current facility-administered medications for this visit.     Review of Systems:  GENERAL:  Feels "fine".  No fevers or sweats.  Weight up 7  pounds. PERFORMANCE STATUS (ECOG):  1 HEENT:  No visual changes, runny nose, sore throat, mouth sores or tenderness. Lungs: No shortness of breath or cough.  No hemoptysis. Cardiac:  No chest pain, palpitations, orthopnea, or PND. GI:  No nausea, vomiting, diarrhea, constipation, melena or hematochezia. GU:  No urgency, frequency, dysuria, or hematuria. Musculoskeletal:  No back pain.  No joint pain.  No muscle tenderness. Extremities:  No pain or swelling. Skin:  No rashes or skin changes. Neuro:  No headache, numbness or weakness, balance or coordination issues.  Poor memory. Endocrine:  No diabetes, thyroid issues, hot flashes or night sweats. Psych:  No mood changes, depression or anxiety. Pain:  No focal pain. Review of systems:  All other systems reviewed and found to be negative.  Physical Exam: Blood pressure (!) 145/87, pulse 94, temperature 97.6 F (36.4 C), temperature source Tympanic, resp. rate 18, weight 240 lb 5 oz (109 kg). GENERAL:  Well developed, well nourished, woman sitting comfortably in the exam room in no acute distress. MENTAL STATUS:  Alert and oriented to person, place and time. HEAD:  Black hair.  Normocephalic, atraumatic, face symmetric, no Cushingoid features. EYES:  Brown eyes.  No conjunctivitis or scleral icterus. NEUROLOGICAL: Unremarkable. PSYCH:  Appropriate.   No visits with results within 3 Day(s) from this visit.  Latest known visit with results is:  Appointment on 04/28/2017  Component Date Value Ref Range Status  . Folate 04/28/2017 17.4  >5.9 ng/mL Final  . Ferritin 04/28/2017 88  11 - 307 ng/mL Final  . Iron 04/28/2017 68  28 - 170 ug/dL Final  . TIBC 04/28/2017 354  250 - 450 ug/dL Final  . Saturation Ratios 04/28/2017 19  10.4 - 31.8 % Final  . UIBC 04/28/2017 287  ug/dL Final    Assessment:  Candace Long is a 71 y.o. female with thrombocytosis.  CBCs dating back to 05/24/2014 reveal a platelet count ranging between 534,000 -  601,000.    She also has a history of a normocytic anemia and an abnormal SPEP.  SPEP on 06/10/2016 revealed a poorly defined band of restricted protein mobility in the gamma globulins Marlowe Aschoff represents a monoclonal gammopathy).  Diet is modest.  She has B12 deficiency.  B12 on 05/27/2015 was 223.  She was started on oral B12.   Work-up on 09/29/2016 revealed the following: hematocrit 34.9, hemoglobin 11.6, MCV 85.9, platelets 511,000, white count 9100 with an ANC of 6100.  JAK2 V617F was negative.  Reflex CALR/MPL was negative. Retic was 1.6%.  Ferritin was 99.  Sedimentation rate was 48.  Iron studies included a saturation of 18% and a TIBC of 322.  JAK2 V617F was negative.  Free light chain ratio was 1.29 (normal).  SPEP revealed no monoclonal protein.  Immunofixation revealed an IgG monoclonal protein with lambda light chain specificity.  Immunoglobulin levels were normal.  She declined bone marrow to r/o ET or a myeloproliferative disorder.  Symptomatically, she denies any complaint.  Exam reveals no adenopathy or hepatosplenomegaly.  Plan: 1.  Discuss labs today that were inadvertantly not drawn at last visit.:  CBC with diff, CMP, myeloma panel, BCR-ABL. 2.  Review thrombocytosis appears reactive.  Negative JAK2 with reflex to MPL/CALR make ET less likely (90% of ET has a mutation in JAK2, CALR, or MPL).  Previously she had declined bone marrow.  Bone marrow would be required to diagnose a myeloproliferative disorder.  Await BCR-ABL (doubt CML).  Discuss working diagnosis of anemia of chronic disease.  Discuss unclear significance of monoclonal protein detected only by immunofixation.  Discuss additional testing. 3.  Collect 24 hour urine for UPEP. 4.  RTC in 2 weeks for MD assessment and review of testing. 5.  Contact patient with test results. 6.  RTC based on lab results.   Addendum:  CBC today reveals a hematocrit 34.3, hemoglobin 11.4, MCV 86.3, platelets 583,000, white count  8400 with an [place] of 5700.  Comprehensive metabolic panel reveals a 242 and creatinine 1.34.  BCR-ABL is negative.  SPEP reveals no monoclonal protein.  Immunofixation is normal.  Kappa free of light chains of 24.6, lambda free light chains 20.5 with a ratio of 1.2 (normal).   Lequita Asal, MD  05/12/2017, 2:22 PM

## 2017-05-13 LAB — KAPPA/LAMBDA LIGHT CHAINS
Kappa free light chain: 24.6 mg/L — ABNORMAL HIGH (ref 3.3–19.4)
Kappa, lambda light chain ratio: 1.2 (ref 0.26–1.65)
Lambda free light chains: 20.5 mg/L (ref 5.7–26.3)

## 2017-05-15 LAB — MULTIPLE MYELOMA PANEL, SERUM
Albumin SerPl Elph-Mcnc: 3.7 g/dL (ref 2.9–4.4)
Albumin/Glob SerPl: 1.3 (ref 0.7–1.7)
Alpha 1: 0.2 g/dL (ref 0.0–0.4)
Alpha2 Glob SerPl Elph-Mcnc: 0.8 g/dL (ref 0.4–1.0)
B-Globulin SerPl Elph-Mcnc: 1 g/dL (ref 0.7–1.3)
Gamma Glob SerPl Elph-Mcnc: 0.9 g/dL (ref 0.4–1.8)
Globulin, Total: 2.9 g/dL (ref 2.2–3.9)
IgA: 101 mg/dL (ref 87–352)
IgG (Immunoglobin G), Serum: 900 mg/dL (ref 700–1600)
IgM, Serum: 96 mg/dL (ref 26–217)
Total Protein ELP: 6.6 g/dL (ref 6.0–8.5)

## 2017-05-20 LAB — BCR-ABL1 FISH
Cells Analyzed: 200
Cells Counted: 200

## 2017-05-27 DIAGNOSIS — H25813 Combined forms of age-related cataract, bilateral: Secondary | ICD-10-CM | POA: Diagnosis not present

## 2017-05-27 DIAGNOSIS — H5202 Hypermetropia, left eye: Secondary | ICD-10-CM | POA: Diagnosis not present

## 2017-05-27 DIAGNOSIS — H524 Presbyopia: Secondary | ICD-10-CM | POA: Diagnosis not present

## 2017-05-27 DIAGNOSIS — E119 Type 2 diabetes mellitus without complications: Secondary | ICD-10-CM | POA: Diagnosis not present

## 2017-05-27 DIAGNOSIS — H52221 Regular astigmatism, right eye: Secondary | ICD-10-CM | POA: Diagnosis not present

## 2017-05-27 DIAGNOSIS — H04123 Dry eye syndrome of bilateral lacrimal glands: Secondary | ICD-10-CM | POA: Diagnosis not present

## 2017-06-01 ENCOUNTER — Other Ambulatory Visit: Payer: Self-pay | Admitting: Family Medicine

## 2017-07-05 ENCOUNTER — Other Ambulatory Visit: Payer: Self-pay | Admitting: Family Medicine

## 2017-07-22 ENCOUNTER — Telehealth: Payer: Self-pay | Admitting: Family Medicine

## 2017-07-22 NOTE — Telephone Encounter (Signed)
Left pt message asking to call Allison back directly at [phone] to schedule AWV + labs with Lesia and CPE with PCP. ° °*NOTE* Last AWV 06/10/16 °

## 2017-07-23 ENCOUNTER — Other Ambulatory Visit: Payer: Self-pay | Admitting: Family Medicine

## 2017-07-26 ENCOUNTER — Encounter: Payer: Self-pay | Admitting: Podiatry

## 2017-07-26 ENCOUNTER — Ambulatory Visit (INDEPENDENT_AMBULATORY_CARE_PROVIDER_SITE_OTHER): Payer: Medicare Other | Admitting: Podiatry

## 2017-07-26 DIAGNOSIS — M79676 Pain in unspecified toe(s): Secondary | ICD-10-CM | POA: Diagnosis not present

## 2017-07-26 DIAGNOSIS — B351 Tinea unguium: Secondary | ICD-10-CM

## 2017-07-26 DIAGNOSIS — E119 Type 2 diabetes mellitus without complications: Secondary | ICD-10-CM

## 2017-07-26 NOTE — Progress Notes (Signed)
Patient ID: Candace Long, female   DOB: [birth year], 71 y.o.   MRN: [id number] Complaint:  Visit Type: Patient returns to my office for continued preventative foot care services. Complaint: Patient states" my nails have grown long and thick and become painful to walk and wear shoes" Patient has been diagnosed with DM with no foot  complications. He presents for preventative foot care services. No changes to ROS  Podiatric Exam: Vascular: dorsalis pedis and posterior tibial pulses are palpable bilateral. Capillary return is immediate. Temperature gradient is WNL. Skin turgor WNL  Sensorium: Normal Semmes Weinstein monofilament test. Normal tactile sensation bilaterally. Nail Exam: Pt has thick disfigured discolored nails with subungual debris noted bilateral entire nail hallux through fifth toenails Ulcer Exam: There is no evidence of ulcer or pre-ulcerative changes or infection. Orthopedic Exam: Muscle tone and strength are WNL. No limitations in general ROM. No crepitus or effusions noted. Foot type and digits show no abnormalities. Bony prominences are unremarkable. Skin: No Porokeratosis. No infection or ulcers  Diagnosis:  Tinea unguium, Pain in right toe, pain in left toes  Treatment & Plan Procedures and Treatment: Consent by patient was obtained for treatment procedures. The patient understood the discussion of treatment and procedures well. All questions were answered thoroughly reviewed. Debridement of mycotic and hypertrophic toenails, 1 through 5 bilateral and clearing of subungual debris. No ulceration, no infection noted.  Return Visit-Office Procedure: Patient instructed to return to the office for a follow up visit 3 months for continued evaluation and treatment.  Gardiner Barefoot DPM

## 2017-07-28 ENCOUNTER — Encounter: Payer: Self-pay | Admitting: Hematology and Oncology

## 2017-08-05 ENCOUNTER — Encounter: Payer: Self-pay | Admitting: Hematology and Oncology

## 2017-08-08 ENCOUNTER — Other Ambulatory Visit: Payer: Self-pay | Admitting: *Deleted

## 2017-08-08 DIAGNOSIS — E538 Deficiency of other specified B group vitamins: Secondary | ICD-10-CM

## 2017-08-10 ENCOUNTER — Inpatient Hospital Stay: Payer: Medicare Other | Attending: Hematology and Oncology

## 2017-08-17 ENCOUNTER — Ambulatory Visit (INDEPENDENT_AMBULATORY_CARE_PROVIDER_SITE_OTHER)
Admission: RE | Admit: 2017-08-17 | Discharge: 2017-08-17 | Disposition: A | Discharge status: Home / Self Care | Payer: Medicare Other | Source: Ambulatory Visit | Attending: Family Medicine | Admitting: Family Medicine

## 2017-08-17 ENCOUNTER — Ambulatory Visit (INDEPENDENT_AMBULATORY_CARE_PROVIDER_SITE_OTHER): Payer: Medicare Other | Admitting: Family Medicine

## 2017-08-17 ENCOUNTER — Encounter: Payer: Self-pay | Admitting: Family Medicine

## 2017-08-17 VITALS — BP 144/94 | HR 90 | Temp 98.2°F | Wt 245.0 lb

## 2017-08-17 DIAGNOSIS — E1121 Type 2 diabetes mellitus with diabetic nephropathy: Secondary | ICD-10-CM

## 2017-08-17 DIAGNOSIS — G3184 Mild cognitive impairment, so stated: Secondary | ICD-10-CM | POA: Diagnosis not present

## 2017-08-17 DIAGNOSIS — N183 Chronic kidney disease, stage 3 unspecified: Secondary | ICD-10-CM

## 2017-08-17 DIAGNOSIS — Z87891 Personal history of nicotine dependence: Secondary | ICD-10-CM | POA: Insufficient documentation

## 2017-08-17 DIAGNOSIS — E041 Nontoxic single thyroid nodule: Secondary | ICD-10-CM

## 2017-08-17 DIAGNOSIS — I499 Cardiac arrhythmia, unspecified: Secondary | ICD-10-CM | POA: Diagnosis not present

## 2017-08-17 DIAGNOSIS — R0602 Shortness of breath: Secondary | ICD-10-CM

## 2017-08-17 DIAGNOSIS — Z794 Long term (current) use of insulin: Secondary | ICD-10-CM | POA: Diagnosis not present

## 2017-08-17 NOTE — Assessment & Plan Note (Signed)
She would be candidate for lung cancer screening CT scan - will discuss at future appt after afib eval.

## 2017-08-17 NOTE — Assessment & Plan Note (Signed)
Overdue for f/u - check A1c today.  

## 2017-08-17 NOTE — Assessment & Plan Note (Addendum)
Progressive dyspnea worse with exertion - possible cardiac or pulm cause. EKG today without acute changes - no afib. Check CXR - cardiomegaly and possible pulm vascular congestion on my read. Pt not currently on lasix but remains on K? Await labs then likely will start lasix.  Also due for spirometry testing to eval asthma/COPD in smoker.  Consider OSA evaluation.

## 2017-08-17 NOTE — Patient Instructions (Addendum)
Labs today. EKG looked overall ok.  Xray today. We will be in touch with results. Follow up will be based on today's results. Good to see you today, call us with questions.

## 2017-08-17 NOTE — Progress Notes (Signed)
BP (!) 144/94   Pulse 90   Temp 98.2 F (36.8 C) (Oral)   Wt 245 lb (111.1 kg)   SpO2 95%   BMI 36.29 kg/m    CC: not feeling well Subjective:    Patient ID: Candace Long, female    DOB: Oct 22, 1946, 71 y.o.   MRN: [id number]  HPI: Candace Long is a 71 y.o. female presenting on 08/17/2017 for Shortness of Breath (about 2 to 3 weeks); Wheezing; Fatigue; and Feet Swelling (both feet)   Here with son today who is also my patient. I last saw patient 05/2016. Not seen in our office in the interim. They moved to [place] in [place] - may find closer office sites - discussed different LB office options.   Here with son today with 2-3 wk h/o progressive dyspnea on exertion and with talking, fatigue, chest discomfort, some wheezing. Persistent ankle swelling. Sleeps with 1 pillow at night.   Denies fevers/chills, productive cough, abd pain, palpitations or headache or dizziness.   She has albuterol inhaler  Ex smoker - quit 7-8 yrs. 1ppd hx x 40+ yrs.   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   She has been seen by oncology clinic for thrombocytosis, latest note reviewed. She has declined bone marrow biopsy.  Son endorses noisy breathing as well as snoring at night. No witnessed sleep apnea. No paroxysmal nocturnal dyspnea. Endorses daytime somnolence.   Relevant past medical, surgical, family and social history reviewed and updated as indicated. Interim medical history since our last visit reviewed. Allergies and medications reviewed and updated. Outpatient Medications Prior to Visit  Medication Sig Dispense Refill  . albuterol (VENTOLIN HFA) 108 (90 Base) MCG/ACT inhaler Inhale 2 puffs into the lungs every 6 (six) hours as needed for wheezing or shortness of breath. 1 Inhaler 3  . amLODipine (NORVASC) 10 MG tablet TAKE 1 TABLET (10 MG TOTAL) BY MOUTH DAILY. 90 tablet 1  . aspirin EC 81 MG tablet Take 81  mg by mouth daily.     Marland Kitchen atorvastatin (LIPITOR) 40 MG tablet TAKE 1 TABLET (40 MG TOTAL) BY MOUTH DAILY. 90 tablet 2  . B-D ULTRAFINE III SHORT PEN 31G X 8 MM MISC USE TO INJECT LANTUS ONCE DAILY. 100 each 3  . Cholecalciferol (VITAMIN D) 1000 UNITS capsule Take 1 capsule (1,000 Units total) by mouth daily.    Marland Kitchen LANTUS SOLOSTAR 100 UNIT/ML Solostar Pen INJECT 30 UNITS INTO THE SKIN DAILY. 15 pen 3  . lisinopril (PRINIVIL,ZESTRIL) 40 MG tablet Take 1 tablet (40 mg total) by mouth daily. 90 tablet 3  . metFORMIN (GLUCOPHAGE) 1000 MG tablet TAKE 1 TABLET (1,000 MG TOTAL) BY MOUTH 2 (TWO) TIMES DAILY WITH A MEAL. 60 tablet 0  . vitamin B-12 (CYANOCOBALAMIN) 1000 MCG tablet Take 1 tablet (1,000 mcg total) by mouth daily.    Marland Kitchen amLODipine (NORVASC) 10 MG tablet Take 10 mg by mouth daily.  2  . KLOR-CON M20 20 MEQ tablet TAKE 1 TABLET (20 MEQ TOTAL) BY MOUTH 2 (TWO) TIMES DAILY. (Patient not taking: Reported on 08/17/2017) 60 tablet 1  . ondansetron (ZOFRAN) 8 MG tablet Take 1 tablet (8 mg total) by mouth every 8 (eight) hours as needed for nausea or vomiting. (Patient not taking: Reported on 08/17/2017) 20 tablet 0   No facility-administered medications prior to visit.      Per HPI unless specifically indicated in ROS section  below Review of Systems     Objective:    BP (!) 144/94   Pulse 90   Temp 98.2 F (36.8 C) (Oral)   Wt 245 lb (111.1 kg)   SpO2 95%   BMI 36.29 kg/m   Wt Readings from Last 3 Encounters:  08/17/17 245 lb (111.1 kg)  05/12/17 240 lb 5 oz (109 kg)  04/28/17 233 lb (105.7 kg)    Physical Exam  Constitutional: She appears well-developed and well-nourished. No distress.  HENT:  Mouth/Throat: Oropharynx is clear and moist. No oropharyngeal exudate.  Neck: Normal range of motion. No thyromegaly present.  Cardiovascular: Normal rate, normal heart sounds and intact distal pulses.  An irregularly irregular rhythm present.  No murmur heard. Pulmonary/Chest: Effort normal and  breath sounds normal. No respiratory distress. She has no wheezes. She has no rales.  Musculoskeletal: She exhibits edema (tr-1+ pitting bilateral).  Skin: Skin is warm and dry. No rash noted.  Psychiatric: She has a normal mood and affect.  Nursing note and vitals reviewed.  Results for orders placed or performed in visit on 05/12/17  CBC with Differential  Result Value Ref Range   WBC 8.4 3.6 - 11.0 K/uL   RBC 3.97 3.80 - 5.20 MIL/uL   Hemoglobin 11.4 (L) 12.0 - 16.0 g/dL   HCT 34.3 (L) 35.0 - 47.0 %   MCV 86.3 80.0 - 100.0 fL   MCH 28.8 26.0 - 34.0 pg   MCHC 33.4 32.0 - 36.0 g/dL   RDW 14.1 11.5 - 14.5 %   Platelets 583 (H) 150 - 440 K/uL   Neutrophils Relative % 68 %   Neutro Abs 5.7 1.4 - 6.5 K/uL   Lymphocytes Relative 24 %   Lymphs Abs 2.0 1.0 - 3.6 K/uL   Monocytes Relative 5 %   Monocytes Absolute 0.4 0.2 - 0.9 K/uL   Eosinophils Relative 2 %   Eosinophils Absolute 0.2 0 - 0.7 K/uL   Basophils Relative 1 %   Basophils Absolute 0.1 0 - 0.1 K/uL  Comprehensive metabolic panel  Result Value Ref Range   Sodium 131 (L) 135 - 145 mmol/L   Potassium 3.8 3.5 - 5.1 mmol/L   Chloride 96 (L) 101 - 111 mmol/L   CO2 26 22 - 32 mmol/L   Glucose, Bld 96 65 - 99 mg/dL   BUN 17 6 - 20 mg/dL   Creatinine, Ser 1.34 (H) 0.44 - 1.00 mg/dL   Calcium 9.3 8.9 - 10.3 mg/dL   Total Protein 7.2 6.5 - 8.1 g/dL   Albumin 3.9 3.5 - 5.0 g/dL   AST 27 15 - 41 U/L   ALT 19 14 - 54 U/L   Alkaline Phosphatase 102 38 - 126 U/L   Total Bilirubin 0.6 0.3 - 1.2 mg/dL   GFR calc non Af Amer 40 (L) >60 mL/min   GFR calc Af Amer 46 (L) >60 mL/min   Anion gap 9 5 - 15  Multiple Myeloma Panel (SPEP&IFE w/QIG)  Result Value Ref Range   IgG (Immunoglobin G), Serum 900 700 - 1,600 mg/dL   IgA 101 87 - 352 mg/dL   IgM, Serum 96 26 - 217 mg/dL   Total Protein ELP 6.6 6.0 - 8.5 g/dL   Albumin SerPl Elph-Mcnc 3.7 2.9 - 4.4 g/dL   Alpha 1 0.2 0.0 - 0.4 g/dL   Alpha2 Glob SerPl Elph-Mcnc 0.8 0.4 - 1.0  g/dL   B-Globulin SerPl Elph-Mcnc 1.0 0.7 - 1.3 g/dL  Gamma Glob SerPl Elph-Mcnc 0.9 0.4 - 1.8 g/dL   M Protein SerPl Elph-Mcnc Not Observed Not Observed g/dL   Globulin, Total 2.9 2.2 - 3.9 g/dL   Albumin/Glob SerPl 1.3 0.7 - 1.7   IFE 1 Comment    Please Note Comment   Kappa/lambda light chains  Result Value Ref Range   Kappa free light chain 24.6 (H) 3.3 - 19.4 mg/L   Lamda free light chains 20.5 5.7 - 26.3 mg/L   Kappa, lamda light chain ratio 1.20 0.26 - 1.65  BCR-ABL1 FISH  Result Value Ref Range   Specimen Type BLOOD    Cells Counted 200    Cells Analyzed 200    FISH Result Comment:    Interpretation Comment:    Director Review: Comment:    EKG - sinus arrhythmia, LAD, normal intervals, no acute ST/T changes, poor R wave progression, possible q waves inferiorly    Assessment & Plan:   Problem List Items Addressed This Visit    Chronic kidney disease, stage III (moderate)   Controlled type 2 diabetes mellitus with diabetic nephropathy ([place])    Overdue for f/u - check A1c today.       Relevant Orders   Hemoglobin A1c   Dyspnea - Primary    Progressive dyspnea worse with exertion - possible cardiac or pulm cause. EKG today without acute changes - no afib. Check CXR - cardiomegaly and possible pulm vascular congestion on my read. Pt not currently on lasix but remains on K? Await labs then likely will start lasix.  Also due for spirometry testing to eval asthma/COPD in smoker.  Consider OSA evaluation.      Relevant Orders   EKG 12-Lead (Completed)   TSH   Basic metabolic panel   CBC with Differential/Platelet   Brain natriuretic peptide   Troponin I   DG Chest 2 View   Ex-smoker    She would be candidate for lung cancer screening CT scan - will discuss at future appt after afib eval.       MCI (mild cognitive impairment) with memory loss   Severe obesity (BMI 35.0-39.9) with comorbidity (HCC)   Thyroid nodule    Update TSH.      Relevant Orders   TSH      Other Visit Diagnoses    Irregular heart beat       Relevant Orders   EKG 12-Lead (Completed)       Follow up plan: Return if symptoms worsen or fail to improve.  Ria Bush, MD

## 2017-08-17 NOTE — Assessment & Plan Note (Signed)
Update TSH

## 2017-08-18 ENCOUNTER — Other Ambulatory Visit: Payer: Medicare Other

## 2017-08-18 ENCOUNTER — Telehealth: Payer: Self-pay | Admitting: Radiology

## 2017-08-18 DIAGNOSIS — N183 Chronic kidney disease, stage 3 unspecified: Secondary | ICD-10-CM

## 2017-08-18 DIAGNOSIS — Z794 Long term (current) use of insulin: Secondary | ICD-10-CM

## 2017-08-18 DIAGNOSIS — R0602 Shortness of breath: Secondary | ICD-10-CM

## 2017-08-18 DIAGNOSIS — E1121 Type 2 diabetes mellitus with diabetic nephropathy: Secondary | ICD-10-CM

## 2017-08-18 LAB — CBC WITH DIFFERENTIAL/PLATELET
BASOS PCT: 0.9 % (ref 0.0–3.0)
Basophils Absolute: 0 10*3/uL (ref 0.0–0.1)
EOS PCT: 4.8 % (ref 0.0–5.0)
Eosinophils Absolute: 0.2 10*3/uL (ref 0.0–0.7)
HEMATOCRIT: 33.9 % — AB (ref 36.0–46.0)
HEMOGLOBIN: 10.7 g/dL — AB (ref 12.0–15.0)
LYMPHS PCT: 49.3 % — AB (ref 12.0–46.0)
Lymphs Abs: 1.9 10*3/uL (ref 0.7–4.0)
MCHC: 31.6 g/dL (ref 30.0–36.0)
MCV: 92.4 fl (ref 78.0–100.0)
Monocytes Absolute: 0.1 10*3/uL (ref 0.1–1.0)
Monocytes Relative: 2.5 % — ABNORMAL LOW (ref 3.0–12.0)
Neutro Abs: 1.6 10*3/uL (ref 1.4–7.7)
Neutrophils Relative %: 42.5 % — ABNORMAL LOW (ref 43.0–77.0)
Platelets: 630 10*3/uL — ABNORMAL HIGH (ref 150.0–400.0)
RBC: 3.67 Mil/uL — ABNORMAL LOW (ref 3.87–5.11)
RDW: 15.4 % (ref 11.5–15.5)
WBC: 3.9 10*3/uL — ABNORMAL LOW (ref 4.0–10.5)

## 2017-08-18 LAB — BASIC METABOLIC PANEL
BUN: 16 mg/dL (ref 6–23)
CHLORIDE: 94 meq/L — AB (ref 96–112)
CO2: 27 mEq/L (ref 19–32)
Calcium: 9.1 mg/dL (ref 8.4–10.5)
Creatinine, Ser: 1.47 mg/dL — ABNORMAL HIGH (ref 0.40–1.20)
GFR: 45.33 mL/min — ABNORMAL LOW (ref >60.00)
Glucose, Bld: 121 mg/dL — ABNORMAL HIGH (ref 70–99)
POTASSIUM: 3.2 meq/L — AB (ref 3.5–5.1)
Sodium: 137 mEq/L (ref 135–145)

## 2017-08-18 LAB — BRAIN NATRIURETIC PEPTIDE: Pro B Natriuretic peptide (BNP): 541 pg/mL — ABNORMAL HIGH (ref 0.0–100.0)

## 2017-08-18 LAB — HEMOGLOBIN A1C: Hgb A1c MFr Bld: 6 % (ref 4.6–6.5)

## 2017-08-18 LAB — TSH: TSH: 2.25 u[IU]/mL (ref 0.35–4.50)

## 2017-08-18 LAB — TROPONIN I: TNIDX: 0.11 ug/L — AB (ref 0.00–0.06)

## 2017-08-18 NOTE — Telephone Encounter (Signed)
[facility] called critical lab results, Troponin 0.11. Results given to Dr Danise Mina

## 2017-08-18 NOTE — Telephone Encounter (Signed)
Mild elevation noted.  She was not in acute distress when seen yesterday, EKG without acute change.  She does have CKD history - ?related.  Will await rest of labs prior to formulating plan.  See CXR results. Will likely need cardiology referral.  Will also ask them to return to trend troponin.

## 2017-08-18 NOTE — Addendum Note (Signed)
Addended by: Ellamae Sia on: 08/18/2017 03:00 PM   Modules accepted: Orders

## 2017-08-19 ENCOUNTER — Encounter (HOSPITAL_COMMUNITY): Payer: Self-pay

## 2017-08-19 ENCOUNTER — Emergency Department (HOSPITAL_COMMUNITY): Payer: Medicare Other

## 2017-08-19 ENCOUNTER — Inpatient Hospital Stay (HOSPITAL_COMMUNITY)
Admission: EM | Admit: 2017-08-19 | Discharge: 2017-08-24 | DRG: 286 | Disposition: A | Discharge status: Home Health | Payer: Medicare Other | Attending: Cardiology | Admitting: Cardiology

## 2017-08-19 ENCOUNTER — Emergency Department (HOSPITAL_COMMUNITY): Admit: 2017-08-19 | Discharge: 2017-08-19 | Disposition: A | Discharge status: Home / Self Care | Payer: Medicare Other

## 2017-08-19 DIAGNOSIS — R0602 Shortness of breath: Secondary | ICD-10-CM

## 2017-08-19 DIAGNOSIS — I472 Ventricular tachycardia: Secondary | ICD-10-CM | POA: Diagnosis present

## 2017-08-19 DIAGNOSIS — I10 Essential (primary) hypertension: Secondary | ICD-10-CM | POA: Diagnosis present

## 2017-08-19 DIAGNOSIS — I272 Pulmonary hypertension, unspecified: Secondary | ICD-10-CM | POA: Diagnosis present

## 2017-08-19 DIAGNOSIS — M7989 Other specified soft tissue disorders: Secondary | ICD-10-CM

## 2017-08-19 DIAGNOSIS — Z888 Allergy status to other drugs, medicaments and biological substances status: Secondary | ICD-10-CM

## 2017-08-19 DIAGNOSIS — Z7982 Long term (current) use of aspirin: Secondary | ICD-10-CM

## 2017-08-19 DIAGNOSIS — Z87891 Personal history of nicotine dependence: Secondary | ICD-10-CM

## 2017-08-19 DIAGNOSIS — E785 Hyperlipidemia, unspecified: Secondary | ICD-10-CM | POA: Diagnosis not present

## 2017-08-19 DIAGNOSIS — R9439 Abnormal result of other cardiovascular function study: Secondary | ICD-10-CM | POA: Diagnosis present

## 2017-08-19 DIAGNOSIS — Z6837 Body mass index (BMI) 37.0-37.9, adult: Secondary | ICD-10-CM

## 2017-08-19 DIAGNOSIS — I471 Supraventricular tachycardia: Secondary | ICD-10-CM | POA: Diagnosis present

## 2017-08-19 DIAGNOSIS — I214 Non-ST elevation (NSTEMI) myocardial infarction: Secondary | ICD-10-CM

## 2017-08-19 DIAGNOSIS — D649 Anemia, unspecified: Secondary | ICD-10-CM | POA: Diagnosis present

## 2017-08-19 DIAGNOSIS — Z794 Long term (current) use of insulin: Secondary | ICD-10-CM

## 2017-08-19 DIAGNOSIS — I5021 Acute systolic (congestive) heart failure: Secondary | ICD-10-CM

## 2017-08-19 DIAGNOSIS — R600 Localized edema: Secondary | ICD-10-CM | POA: Diagnosis not present

## 2017-08-19 DIAGNOSIS — Z8673 Personal history of transient ischemic attack (TIA), and cerebral infarction without residual deficits: Secondary | ICD-10-CM

## 2017-08-19 DIAGNOSIS — I130 Hypertensive heart and chronic kidney disease with heart failure and stage 1 through stage 4 chronic kidney disease, or unspecified chronic kidney disease: Principal | ICD-10-CM | POA: Diagnosis present

## 2017-08-19 DIAGNOSIS — I081 Rheumatic disorders of both mitral and tricuspid valves: Secondary | ICD-10-CM | POA: Diagnosis present

## 2017-08-19 DIAGNOSIS — N183 Chronic kidney disease, stage 3 unspecified: Secondary | ICD-10-CM | POA: Diagnosis present

## 2017-08-19 DIAGNOSIS — D631 Anemia in chronic kidney disease: Secondary | ICD-10-CM | POA: Diagnosis present

## 2017-08-19 DIAGNOSIS — R062 Wheezing: Secondary | ICD-10-CM | POA: Diagnosis present

## 2017-08-19 DIAGNOSIS — D75839 Thrombocytosis, unspecified: Secondary | ICD-10-CM | POA: Diagnosis present

## 2017-08-19 DIAGNOSIS — E876 Hypokalemia: Secondary | ICD-10-CM

## 2017-08-19 DIAGNOSIS — Z79899 Other long term (current) drug therapy: Secondary | ICD-10-CM

## 2017-08-19 DIAGNOSIS — R0609 Other forms of dyspnea: Secondary | ICD-10-CM | POA: Diagnosis present

## 2017-08-19 DIAGNOSIS — I429 Cardiomyopathy, unspecified: Secondary | ICD-10-CM | POA: Diagnosis present

## 2017-08-19 DIAGNOSIS — Z833 Family history of diabetes mellitus: Secondary | ICD-10-CM

## 2017-08-19 DIAGNOSIS — E1122 Type 2 diabetes mellitus with diabetic chronic kidney disease: Secondary | ICD-10-CM | POA: Diagnosis present

## 2017-08-19 DIAGNOSIS — I4729 Other ventricular tachycardia: Secondary | ICD-10-CM

## 2017-08-19 DIAGNOSIS — Z8249 Family history of ischemic heart disease and other diseases of the circulatory system: Secondary | ICD-10-CM

## 2017-08-19 DIAGNOSIS — R7989 Other specified abnormal findings of blood chemistry: Secondary | ICD-10-CM

## 2017-08-19 DIAGNOSIS — E1121 Type 2 diabetes mellitus with diabetic nephropathy: Secondary | ICD-10-CM | POA: Diagnosis present

## 2017-08-19 DIAGNOSIS — I4719 Other supraventricular tachycardia: Secondary | ICD-10-CM

## 2017-08-19 DIAGNOSIS — D473 Essential (hemorrhagic) thrombocythemia: Secondary | ICD-10-CM | POA: Diagnosis present

## 2017-08-19 DIAGNOSIS — R778 Other specified abnormalities of plasma proteins: Secondary | ICD-10-CM

## 2017-08-19 DIAGNOSIS — I428 Other cardiomyopathies: Secondary | ICD-10-CM

## 2017-08-19 DIAGNOSIS — Z982 Presence of cerebrospinal fluid drainage device: Secondary | ICD-10-CM

## 2017-08-19 LAB — MAGNESIUM: MAGNESIUM: 1.1 mg/dL — AB (ref 1.7–2.4)

## 2017-08-19 LAB — BASIC METABOLIC PANEL
ANION GAP: 13 (ref 5–15)
Anion gap: 14 (ref 5–15)
BUN: 21 mg/dL — ABNORMAL HIGH (ref 6–20)
BUN: 23 mg/dL — ABNORMAL HIGH (ref 6–20)
CALCIUM: 8.8 mg/dL — AB (ref 8.9–10.3)
CO2: 26 mmol/L (ref 22–32)
CO2: 25 mmol/L (ref 22–32)
Calcium: 8.9 mg/dL (ref 8.9–10.3)
Chloride: 97 mmol/L — ABNORMAL LOW (ref 101–111)
Chloride: 98 mmol/L — ABNORMAL LOW (ref 101–111)
Creatinine, Ser: 1.97 mg/dL — ABNORMAL HIGH (ref 0.44–1.00)
Creatinine, Ser: 1.82 mg/dL — ABNORMAL HIGH (ref 0.44–1.00)
GFR calc Af Amer: 29 mL/min — ABNORMAL LOW (ref >60)
GFR calc non Af Amer: 25 mL/min — ABNORMAL LOW (ref >60)
GFR calc non Af Amer: 27 mL/min — ABNORMAL LOW (ref >60)
GFR, EST AFRICAN AMERICAN: 32 mL/min — AB (ref >60)
Glucose, Bld: 110 mg/dL — ABNORMAL HIGH (ref 65–99)
Glucose, Bld: 74 mg/dL (ref 65–99)
POTASSIUM: 3.3 mmol/L — AB (ref 3.5–5.1)
Potassium: 3 mmol/L — ABNORMAL LOW (ref 3.5–5.1)
Sodium: 137 mmol/L (ref 135–145)
Sodium: 136 mmol/L (ref 135–145)

## 2017-08-19 LAB — CBC
HCT: 32 % — ABNORMAL LOW (ref 36.0–46.0)
Hemoglobin: 9.9 g/dL — ABNORMAL LOW (ref 12.0–15.0)
MCH: 28.4 pg (ref 26.0–34.0)
MCHC: 30.9 g/dL (ref 30.0–36.0)
MCV: 91.7 fL (ref 78.0–100.0)
Platelets: 477 10*3/uL — ABNORMAL HIGH (ref 150–400)
RBC: 3.49 MIL/uL — ABNORMAL LOW (ref 3.87–5.11)
RDW: 15 % (ref 11.5–15.5)
WBC: 3.1 10*3/uL — ABNORMAL LOW (ref 4.0–10.5)

## 2017-08-19 LAB — TROPONIN I
Troponin I: 0.25 ng/mL (ref <=0.0)
Troponin I: 0.11 ng/mL (ref <0.03)

## 2017-08-19 LAB — GLUCOSE, CAPILLARY: Glucose-Capillary: 130 mg/dL — ABNORMAL HIGH (ref 65–99)

## 2017-08-19 LAB — I-STAT TROPONIN, ED
Troponin i, poc: 0.09 ng/mL (ref 0.00–0.08)
Troponin i, poc: 0.08 ng/mL (ref 0.00–0.08)

## 2017-08-19 LAB — BRAIN NATRIURETIC PEPTIDE: B NATRIURETIC PEPTIDE 5: 566.9 pg/mL — AB (ref 0.0–100.0)

## 2017-08-19 MED ORDER — ONDANSETRON HCL 4 MG/2ML IJ SOLN: 4.0000 mg | Freq: Four times a day (QID) | INTRAMUSCULAR | Status: DC | PRN

## 2017-08-19 MED ORDER — AMLODIPINE BESYLATE 10 MG PO TABS
10.0000 mg | ORAL_TABLET | Freq: Every day | ORAL | Status: DC
  Administered 2017-08-20 – 2017-08-23 (×4): 10 mg via ORAL
  Filled 2017-08-19 (×4): qty 1

## 2017-08-19 MED ORDER — ATORVASTATIN CALCIUM 40 MG PO TABS
40.0000 mg | ORAL_TABLET | Freq: Every day | ORAL | Status: DC
  Administered 2017-08-19 – 2017-08-23 (×5): 40 mg via ORAL
  Filled 2017-08-19 (×6): qty 1

## 2017-08-19 MED ORDER — ASPIRIN EC 81 MG PO TBEC
81.0000 mg | DELAYED_RELEASE_TABLET | Freq: Every day | ORAL | Status: DC
  Administered 2017-08-20 – 2017-08-24 (×5): 81 mg via ORAL
  Filled 2017-08-19 (×5): qty 1

## 2017-08-19 MED ORDER — POTASSIUM CHLORIDE CRYS ER 20 MEQ PO TBCR
20.0000 meq | EXTENDED_RELEASE_TABLET | Freq: Two times a day (BID) | ORAL | Status: DC
  Administered 2017-08-19 – 2017-08-22 (×6): 20 meq via ORAL
  Filled 2017-08-19 (×6): qty 1

## 2017-08-19 MED ORDER — SODIUM CHLORIDE 0.9 % IV SOLN: 250.0000 mL | INTRAVENOUS | Status: DC | PRN

## 2017-08-19 MED ORDER — ACETAMINOPHEN 325 MG PO TABS
650.0000 mg | ORAL_TABLET | ORAL | Status: DC | PRN
  Filled 2017-08-19: qty 2

## 2017-08-19 MED ORDER — ALBUTEROL SULFATE (2.5 MG/3ML) 0.083% IN NEBU: 2.5000 mg | INHALATION_SOLUTION | Freq: Four times a day (QID) | RESPIRATORY_TRACT | Status: DC | PRN

## 2017-08-19 MED ORDER — SODIUM CHLORIDE 0.9% FLUSH: 3.0000 mL | INTRAVENOUS | Status: DC | PRN

## 2017-08-19 MED ORDER — ASPIRIN 300 MG RE SUPP: 300.0000 mg | RECTAL | Status: DC

## 2017-08-19 MED ORDER — ASPIRIN 81 MG PO CHEW
324.0000 mg | CHEWABLE_TABLET | Freq: Once | ORAL | Status: AC
  Administered 2017-08-19: 324 mg via ORAL
  Filled 2017-08-19: qty 4

## 2017-08-19 MED ORDER — HEPARIN (PORCINE) IN NACL 100-0.45 UNIT/ML-% IJ SOLN
1500.0000 [IU]/h | INTRAMUSCULAR | Status: DC
  Administered 2017-08-19: 1250 [IU]/h via INTRAVENOUS
  Administered 2017-08-20: 1500 [IU]/h via INTRAVENOUS
  Filled 2017-08-19 (×2): qty 250

## 2017-08-19 MED ORDER — POTASSIUM CHLORIDE CRYS ER 20 MEQ PO TBCR
40.0000 meq | EXTENDED_RELEASE_TABLET | Freq: Once | ORAL | Status: DC
  Administered 2017-08-21: 40 meq via ORAL
  Filled 2017-08-19: qty 2

## 2017-08-19 MED ORDER — NITROGLYCERIN 0.4 MG SL SUBL: 0.4000 mg | SUBLINGUAL_TABLET | SUBLINGUAL | Status: DC | PRN

## 2017-08-19 MED ORDER — HEPARIN BOLUS VIA INFUSION
4000.0000 [IU] | Freq: Once | INTRAVENOUS | Status: AC
  Administered 2017-08-19: 4000 [IU] via INTRAVENOUS
  Filled 2017-08-19: qty 4000

## 2017-08-19 MED ORDER — SODIUM CHLORIDE 0.9% FLUSH
3.0000 mL | Freq: Two times a day (BID) | INTRAVENOUS | Status: DC
  Administered 2017-08-20 – 2017-08-22 (×5): 3 mL via INTRAVENOUS

## 2017-08-19 MED ORDER — INSULIN ASPART 100 UNIT/ML ~~LOC~~ SOLN
0.0000 [IU] | Freq: Three times a day (TID) | SUBCUTANEOUS | Status: DC
  Administered 2017-08-20 (×2): 2 [IU] via SUBCUTANEOUS
  Administered 2017-08-21: 3 [IU] via SUBCUTANEOUS
  Administered 2017-08-22 – 2017-08-23 (×2): 2 [IU] via SUBCUTANEOUS
  Administered 2017-08-23: 3 [IU] via SUBCUTANEOUS
  Administered 2017-08-23: 2 [IU] via SUBCUTANEOUS

## 2017-08-19 MED ORDER — INSULIN ASPART 100 UNIT/ML ~~LOC~~ SOLN
0.0000 [IU] | Freq: Every day | SUBCUTANEOUS | Status: DC
  Administered 2017-08-21 – 2017-08-22 (×2): 2 [IU] via SUBCUTANEOUS

## 2017-08-19 MED ORDER — ASPIRIN 81 MG PO CHEW: 324.0000 mg | CHEWABLE_TABLET | ORAL | Status: DC

## 2017-08-19 NOTE — H&P (Signed)
Patient ID: Candace Long MRN: [id number], DOB/AGE: [birth year]   Admit date: 08/19/2017  Requesting Physician: Primary Physician: Ria Bush, MD Primary Cardiologist: None Reason for admission: n/a  HPI:  Candace Long is a 71 y.o. female with a history of hyperlipidemia, hypertension, obesity, insulin dependent type 2 diabetes and kidney insufficiency. 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 We are being requested for admission for elevated Troponin by Dr. Wilson Singer.  She does not have a cardiologist. Nuclear stress test done 01/08/2013: EF 55% low risk nuc.  She see's oncology for thrombocytosis; last seen on 05/12/2017 discussing if they want to do bone marrow biopsy or not, plan was for follow-up in clinic in 2 weeks.  Candace Long was seen by her PCP Dr. Dionisio Paschal with [facility] on 08/17/2017 for shortness of breath for 2-3 weeks, wheezing, fatigue, feet swelling (bilateral). Chest discomfort with exertion at first but denies having this recently. She has a 1 ppd hx x 40 yr of smoking and quit 7-8 years ago. Her PCP ordered a chest xray was done that showed possible pulmonary vascular congestion (not on lasix but taking potassium), EKG no acute ischemia. Labs drawn TSH, BMP, CBC, BNP, Troponin.  Troponin resulted at 0.11 and 0.25 and BNP elevated as well > 500. Therefore she was told to come to the ER for evaluation. .   She continues to have progressively worsening dyspnea on exertion, palpitations and le swelling.  Troponin in ER 0.09.  Cr 1.97 (CR 1.18 was normal 07/09/16).  Neg Le dopplers.  BNP  566.  Mg pending.The ER started her on Heparin  She is currently hemodynamically stable and not in any distress.  Problem List  Past Medical History:  Diagnosis Date  . Chronic kidney disease, stage III (moderate) 05/24/2014  . HLD (hyperlipidemia)   . Hypertension   . Obesity   . S/P cerebral aneurysm  repair 1993   some residual memory impairment  . Uncontrolled type 2 diabetes mellitus with nephropathy [facility])    DSME [facility] 06/2014, did not keep f/u classes so discharged    Past Surgical History:  Procedure Laterality Date  . CARDIOVASCULAR STRESS TEST  12/2012   WNL EF 55% ([facility])  . COLONOSCOPY  07/2014   1 tubular adenoma, severe diverticulosis, rpt 5 yrs [facility])  . CRANIOTOMY  1993   cerebral aneurysm repair  . VENTRICULOPERITONEAL SHUNT  1993     Allergies  Allergies  Allergen Reactions  . Hydrochlorothiazide Other (See Comments)    Hypokalemia, hyponatremia     Home Medications  Prior to Admission medications   Medication Sig Start Date End Date Taking? Authorizing Provider  albuterol (VENTOLIN HFA) 108 (90 Base) MCG/ACT inhaler Inhale 2 puffs into the lungs every 6 (six) hours as needed for wheezing or shortness of breath. 12/27/15   Ria Bush, MD  amLODipine (NORVASC) 10 MG tablet TAKE 1 TABLET (10 MG TOTAL) BY MOUTH DAILY. 06/01/17   Ria Bush, MD  aspirin EC 81 MG tablet Take 81 mg by mouth daily.     [provider]  atorvastatin (LIPITOR) 40 MG tablet TAKE 1 TABLET (40 MG TOTAL) BY MOUTH DAILY. 06/01/17   Ria Bush, MD  B-D ULTRAFINE III SHORT PEN 31G X 8 MM MISC USE TO INJECT LANTUS ONCE DAILY. 07/05/17   Ria Bush, MD  Cholecalciferol (VITAMIN D) 1000 UNITS capsule Take 1 capsule (1,000 Units total) by mouth  daily. 05/27/14   Ria Bush, MD  KLOR-CON M20 20 MEQ tablet TAKE 1 TABLET (20 MEQ TOTAL) BY MOUTH 2 (TWO) TIMES DAILY. 04/06/17   Ria Bush, MD  LANTUS SOLOSTAR 100 UNIT/ML Solostar Pen INJECT 30 UNITS INTO THE SKIN DAILY. 02/18/17   Ria Bush, MD  lisinopril (PRINIVIL,ZESTRIL) 40 MG tablet Take 1 tablet (40 mg total) by mouth daily. 06/03/15   Ria Bush, MD  metFORMIN (GLUCOPHAGE) 1000 MG tablet TAKE 1 TABLET (1,000 MG TOTAL) BY MOUTH 2 (TWO) TIMES DAILY WITH A MEAL. 07/25/17   Ria Bush,  MD  vitamin B-12 (CYANOCOBALAMIN) 1000 MCG tablet Take 1 tablet (1,000 mcg total) by mouth daily. 05/28/15   Ria Bush, MD    Family History  Family History  Problem Relation Age of Onset  . Hypertension Mother   . Diabetes Mother   . Diabetes Sister   . Heart failure Sister   . CAD Neg Hx   . Stroke Neg Hx   . Cancer Neg Hx   . Colon cancer Neg Hx   . Esophageal cancer Neg Hx   . Rectal cancer Neg Hx   . Stomach cancer Neg Hx      check Family Status  Relation Status  . Mother (Not Specified)  . Sister (Not Specified)  . Sister (Not Specified)  . Neg Hx (Not Specified)     Social History  Social History   Social History  . Marital status: Divorced    Spouse name: N/A  . Number of children: N/A  . Years of education: N/A   Occupational History  . Not on file.   Social History Main Topics  . Smoking status: Former Smoker    Packs/day: 1.00    Types: Cigarettes    Quit date: 12/13/2008  . Smokeless tobacco: Former Systems developer    Types: Snuff    Quit date: 12/13/2010  . Alcohol use No  . Drug use: No  . Sexual activity: Not on file   Other Topics Concern  . Not on file   Social History Narrative   Lives with son and his wife and 1 granddaughter   Occupation: retired, Engineer, manufacturing systems   Disability since 1993 after aneurysm   Edu: HS   Activity: no regular exercise   Diet: good water, some fruits, vegetables daily      Advanced directives: Son is HCPOA     Review of Systems General:  No chills, fever, night sweats Cardiovascular:  No  Orthopnea  Dermatological: No rash, lesions/masses Respiratory: No cough Urologic: No hematuria, dysuria Abdominal:   No nausea, vomiting, diarrhea, bright red blood per rectum, melena, or hematemesis Neurologic:  No visual changes, wkns, changes in mental status. All other systems reviewed and are otherwise negative except as noted above.  Physical Exam  Blood pressure (!) 111/99, pulse 87, temperature 98 F (36.7  C), temperature source Oral, resp. rate (!) 26, height 5' 8"  (1.727 m), weight 245 lb (111.1 kg), SpO2 98 %.  General: Pleasant, NAD Psych: Normal affect. Neuro: Alert and oriented X 3. Moves all extremities spontaneously. HEENT: Normal  Neck: Supple without bruits. + JVD Lungs: Increased effort of breathing with crackles at the lung bases Heart: RRR no s3, s4, or murmurs. Abdomen: Soft, non-tender, non-distended, BS + x 4.  Extremities: No clubbing, cyanosis  DP/PT/Radials 2+ and equal bilaterally. + le swelling  Labs   Recent Labs  08/18/17 1647  TROPONINI 0.25*   Lab Results  Component Value Date  WBC 3.1 (L) 08/19/2017   HGB 9.9 (L) 08/19/2017   HCT 32.0 (L) 08/19/2017   MCV 91.7 08/19/2017   PLT 477 (H) 08/19/2017    Recent Labs Lab 08/19/17 1305  NA 137  K 3.0*  CL 97*  CO2 26  BUN 21*  CREATININE 1.97*  CALCIUM 8.9  GLUCOSE 110*   Lab Results  Component Value Date   CHOL 149 05/31/2016   HDL 40.20 05/31/2016   LDLCALC 72 05/31/2016   TRIG 184.0 (H) 05/31/2016   No results found for: DDIMER   Radiology/Studies  Dg Chest 2 View  Result Date: 08/19/2017 CLINICAL DATA:  Shortness of Breath EXAM: CHEST  2 VIEW COMPARISON:  August 17, 2017 and November 12, 2013 FINDINGS: There is there is mild bibasilar atelectasis, stable. There is no appreciable edema or consolidation. Heart is slightly enlarged with pulmonary vascularity within normal limits. No evident adenopathy. There is degenerative change in the thoracic spine. 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 IMPRESSION: Stable bibasilar atelectasis. No edema or consolidation. Stable cardiac prominence. Electronically Signed   By: Lowella Grip III M.D.   On: 08/19/2017 13:20   Dg Chest 2 View  Result Date: 08/18/2017 CLINICAL DATA:  Progressive shortness of breath EXAM: CHEST  2 VIEW COMPARISON:  Chest x-ray of 11/12/2013 FINDINGS: The heart is mildly enlarged. There may be a tiny  left pleural effusion with mild basilar atelectasis. Also there is a question of mild pulmonary vascular congestion. Right central venous line remains. There are degenerative changes throughout the thoracic spine. IMPRESSION: Possible mild pulmonary vascular congestion. Mild bibasilar atelectasis. Electronically Signed   By: Ivar Drape M.D.   On: 08/18/2017 08:22    ECG  Sinus rhythm with HR of 102 with prolonged QT with PVCs  ASSESSMENT AND PLAN  Elevated Troponin:  She has had some chest discomfort in the setting of progressively worsening exertional dyspnea,weight gain with lower extremity swelling. Weight was 240lb on 5/31 and 245 lb on 9/5. Will need to be weighed today. The etiology of her elevated troponin is unclear, this may be unstable angina and she will need some form of ischemic work-up while in the ER.  --  Cont ASA and continue heparin --   Cycle Troponins --   Repeat EKG in the AM --   Order echocardiogram --   Pt will need some form of ischemic evaluation this admission.  Dyspnea on exertion and elevated BNP: We do not plan to diurese at this time due to concern of worsening creatinine., will obtain echocardiogram. -- daily weights and I/Os --   LE dopplers negative  Prolonged QTc on EKG: follow-up   Hypokalemia: Potassium is 3.0, replace.  Hypertension: Currently controlled. Hold Lisinopril because of elevated creatinine. -- continue Norvasc  Hyperlipidemia: LDL was controlled in 2017 around 70. Consider rechecking, continue statin.  Insulin dependent type 2 diabetes: CBG 110 -- SSI, will hold metformin  CKD III: creatinine elevated. 1.97 (CR 1.18 was normal 07/09/16) -- hold metformin and Lisinopril   Signed, Linus Mako, PA-C 08/19/2017, 3:51 PM   History and all data above reviewed.  Patient examined.  I agree with the findings as above. The patient presents with SOB.  She saw her PCP a couple of days ago with this complaint and had labs drawn that  included a troponin which was elevated.  It was suggested today that she should come to the ED.  She says that she has had about two weeks  of increasing dyspnea.  She has had no chest/neck or arm pain.  She denies any cough fevers or chills.  She has had no PND or orthopnea.  She gets SOB walking a short distance on level ground.  She has had no prior cardiac work up.  she does have known anemia and CKD.  The patient exam reveals COR:RRR  ,  Lungs: clear  ,  Abd: Positive bowel sounds, no rebound no guarding, Ext no edema  .  All available labs, radiology testing, previous records reviewed. Agree with documented assessment and plan. DYSPNEA:  I suspect diastolic HF.  Could also be an anginal equivalent.  She has a mildly increased BNP but no edema on CXR and no overt pulmonary edema on exam.  Her creat is up.  I am not inclined to increase her diuresis but rather to check an echo, rule out MI, follow creat, use heparin and ASA.  Hold ACE ARB.  Pending the result of enzymes and echo plan on cath vs. Lexiscan Myoview this admission.    Jeneen Rinks Hasan Douse  5:50 PM  08/19/2017

## 2017-08-19 NOTE — ED Notes (Signed)
Got patient undress into a gown on the monitor and warm blanket patient with call bell in reach patient is resting with family at bedside

## 2017-08-19 NOTE — Progress Notes (Addendum)
*  PRELIMINARY RESULTS* Vascular Ultrasound Left lower extremity venous duplex has been completed.  Preliminary findings: no evidence of DVT. Area of mixed echoes with calcifications noted in the left popliteal fossa, possibly a complex baker's cyst vs unknown mass.   Landry Mellow, RDMS, RVT  08/19/2017, 3:31 PM

## 2017-08-19 NOTE — ED Notes (Signed)
Pt transported to vascular.  °

## 2017-08-19 NOTE — Progress Notes (Signed)
[place] for heparin  Indication: chest pain/ACS  Allergies  Allergen Reactions  . Hydrochlorothiazide Other (See Comments)    Hypokalemia, hyponatremia    Patient Measurements: Height: 5\' 8"  (172.7 cm) Weight: 245 lb (111.1 kg) IBW/kg (Calculated) : 63.9 Heparin Dosing Weight: 89 kg  Vital Signs: Temp: 98 F (36.7 C) (09/07 1240) Temp Source: Oral (09/07 1240) BP: 124/92 (09/07 1445) Pulse Rate: 68 (09/07 1445)  Labs:  Recent Labs  08/17/17 1831 08/18/17 1647 08/19/17 1305  HGB 10.7*  --  9.9*  HCT 33.9*  --  32.0*  PLT 630.0*  --  477*  CREATININE 1.47*  --  1.97*  TROPONINI  --  0.25*  --     Estimated Creatinine Clearance: 35.7 mL/min (A) (by C-G formula based on SCr of 1.97 mg/dL (H)).   Medical History: Past Medical History:  Diagnosis Date  . Chronic kidney disease, stage III (moderate) 05/24/2014  . HLD (hyperlipidemia)   . Hypertension   . Obesity   . S/P cerebral aneurysm repair 1993   some residual memory impairment  . Uncontrolled type 2 diabetes mellitus with nephropathy [facility])    DSME [facility] 06/2014, did not keep f/u classes so discharged     Assessment: 71 yo female admitted with CP and SOB. Initial troponin 0.09. To begin heparin gtt for ACS workup. CBC stable with no known a/c PTA.   Goal of Therapy:  Heparin level 0.3-0.7 units/ml Monitor platelets by anticoagulation protocol: Yes   Plan:  1. Give 4000 units bolus x 1 2. Start heparin infusion at 1250 units/hr 3. Check anti-Xa level in 8 hours and daily while on heparin 4. Continue to monitor H&H and platelets    Vincenza Hews, PharmD, BCPS 08/19/2017, 2:57 PM

## 2017-08-19 NOTE — ED Notes (Signed)
I did blood work

## 2017-08-19 NOTE — ED Notes (Signed)
Main lab will add on Mag and BNP

## 2017-08-19 NOTE — ED Provider Notes (Signed)
[facility] DEPT Provider Note   CSN: [id number] Arrival date & time: 08/19/17  1231     History   Chief Complaint Chief Complaint  Patient presents with  . Shortness of Breath  . Chest Pain    HPI Candace Long is a 71 y.o. female with history of hyperlipidemia, hypertension, obesity, insulin-dependent type 2 diabetes and kidney insufficiency presents to ED from PCP office after she was told her heart enzymes were elevated 2 days ago on lab work. Patient is accompanied by daughter and son who assisted history. Over the last 2-3 weeks patient has had progressive dyspnea on exertion, fatigue, chest discomfort (not pain) on exertion, palpitations, bilateral leg swelling worse on the left. Takes daily aspirin.   Denies fevers, chills, cough, lightheadedness, orthopnea. Was a smoker over 8 years ago. History of stroke April 2008. CBGs are well-controlled around 105-110. No h/o CAD, heart attacks, DVT/PE. Denies CP currently.   HPI  Past Medical History:  Diagnosis Date  . Chronic kidney disease, stage III (moderate) 05/24/2014  . HLD (hyperlipidemia)   . Hypertension   . Obesity   . S/P cerebral aneurysm repair 1993   some residual memory impairment  . Uncontrolled type 2 diabetes mellitus with nephropathy [facility])    DSME [facility] 06/2014, did not keep f/u classes so discharged    Patient Active Problem List   Diagnosis Date Noted  . Ex-smoker 08/17/2017  . 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 Normocytic anemia 09/29/2016  . Abnormal SPEP 09/29/2016  . Anemia 09/05/2016  . Hyponatremia 06/20/2016  . 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 Hearing impaired 06/10/2016  . Polyuria 06/10/2016  . Thyroid nodule 09/24/2015  . Vitamin D deficiency 09/04/2015  . Vitamin B12 deficiency 09/04/2015  . Dyspnea 09/04/2015  . Thrombocytosis ([place]) 06/10/2015  . Advanced care planning/counseling discussion 05/27/2015  . Medicare annual wellness visit, subsequent 05/24/2014    . MCI (mild cognitive impairment) with memory loss 05/24/2014  . Chronic kidney disease, stage III (moderate) 05/24/2014  . Hypertension   . Controlled type 2 diabetes mellitus with diabetic nephropathy ([place])   . HLD (hyperlipidemia)   . Severe obesity (BMI 35.0-39.9) with comorbidity [facility])     Past Surgical History:  Procedure Laterality Date  . CARDIOVASCULAR STRESS TEST  12/2012   WNL EF 55% ([place])  . COLONOSCOPY  07/2014   1 tubular adenoma, severe diverticulosis, rpt 5 yrs [facility] Pastor)  . CRANIOTOMY  1993   cerebral aneurysm repair  . VENTRICULOPERITONEAL SHUNT  1993    OB History    No data available       Home Medications    Prior to Admission medications   Medication Sig Start Date End Date Taking? Authorizing Provider  albuterol (VENTOLIN HFA) 108 (90 Base) MCG/ACT inhaler Inhale 2 puffs into the lungs every 6 (six) hours as needed for wheezing or shortness of breath. 12/27/15   Ria Bush, MD  amLODipine (NORVASC) 10 MG tablet TAKE 1 TABLET (10 MG TOTAL) BY MOUTH DAILY. 06/01/17   Ria Bush, MD  aspirin EC 81 MG tablet Take 81 mg by mouth daily.     [provider]  atorvastatin (LIPITOR) 40 MG tablet TAKE 1 TABLET (40 MG TOTAL) BY MOUTH DAILY. 06/01/17   Ria Bush, MD  B-D ULTRAFINE III SHORT PEN 31G X 8 MM MISC USE TO INJECT LANTUS ONCE DAILY. 07/05/17   Ria Bush, MD  Cholecalciferol (VITAMIN D) 1000 UNITS capsule Take 1 capsule (1,000 Units total) by mouth  daily. 05/27/14   Ria Bush, MD  KLOR-CON M20 20 MEQ tablet TAKE 1 TABLET (20 MEQ TOTAL) BY MOUTH 2 (TWO) TIMES DAILY. Patient not taking: Reported on 08/17/2017 04/06/17   Ria Bush, MD  LANTUS SOLOSTAR 100 UNIT/ML Solostar Pen INJECT 30 UNITS INTO THE SKIN DAILY. 02/18/17   Ria Bush, MD  lisinopril (PRINIVIL,ZESTRIL) 40 MG tablet Take 1 tablet (40 mg total) by mouth daily. 06/03/15   Ria Bush, MD  metFORMIN (GLUCOPHAGE) 1000 MG tablet TAKE 1 TABLET  (1,000 MG TOTAL) BY MOUTH 2 (TWO) TIMES DAILY WITH A MEAL. 07/25/17   Ria Bush, MD  vitamin B-12 (CYANOCOBALAMIN) 1000 MCG tablet Take 1 tablet (1,000 mcg total) by mouth daily. 05/28/15   Ria Bush, MD    Family History Family History  Problem Relation Age of Onset  . Hypertension Mother   . Diabetes Mother   . Diabetes Sister   . Heart failure Sister   . CAD Neg Hx   . Stroke Neg Hx   . Cancer Neg Hx   . Colon cancer Neg Hx   . Esophageal cancer Neg Hx   . Rectal cancer Neg Hx   . Stomach cancer Neg Hx     Social History Social History  Substance Use Topics  . Smoking status: Former Smoker    Packs/day: 1.00    Types: Cigarettes    Quit date: 12/13/2008  . Smokeless tobacco: Former Systems developer    Types: Snuff    Quit date: 12/13/2010  . Alcohol use No     Allergies   Hydrochlorothiazide   Review of Systems Review of Systems  Constitutional: Positive for fatigue. Negative for diaphoresis and fever.  HENT: Negative for congestion.   Respiratory: Positive for chest tightness and shortness of breath. Negative for cough.   Cardiovascular: Positive for chest pain, palpitations and leg swelling.  Gastrointestinal: Negative for abdominal pain, constipation, diarrhea, nausea and vomiting.  Genitourinary: Negative for dysuria and hematuria.  Skin: Negative for rash.  Neurological: Negative for light-headedness and headaches.     Physical Exam Updated Vital Signs BP 139/86   Pulse 92   Temp 98 F (36.7 C) (Oral)   Resp 18   Ht 5\' 8"  (1.727 m)   Wt 111.1 kg (245 lb)   SpO2 95%   BMI 37.25 kg/m   Physical Exam  Constitutional: She is oriented to person, place, and time. She appears well-developed and well-nourished. No distress.  NAD.  HENT:  Head: Normocephalic and atraumatic.  Right Ear: External ear normal.  Left Ear: External ear normal.  Nose: Nose normal.  Eyes: Pupils are equal, round, and reactive to light. Conjunctivae and EOM are normal. No  scleral icterus.  Neck: Normal range of motion. Neck supple.  Cardiovascular: Normal rate, regular rhythm, S1 normal, S2 normal and normal heart sounds.   No murmur heard. Pulses:      Carotid pulses are 2+ on the right side, and 2+ on the left side.      Radial pulses are 2+ on the right side, and 2+ on the left side.       Dorsalis pedis pulses are 2+ on the right side, and 2+ on the left side.  Bilateral lower extremity edema 1-2+ (L>R) Left calf tenderness   Pulmonary/Chest: Effort normal. She has decreased breath sounds in the right lower field and the left lower field. She has no wheezes.  No orthopnea  Decreased breath sounds bilateral lower lobes, likely from body habitus  Abdominal: Soft. Normal appearance and bowel sounds are normal. There is no tenderness.  Musculoskeletal: Normal range of motion. She exhibits no deformity.  Neurological: She is alert and oriented to person, place, and time.  Skin: Skin is warm and dry. Capillary refill takes less than 2 seconds.  Psychiatric: She has a normal mood and affect. Her behavior is normal. Judgment and thought content normal.  Nursing note and vitals reviewed.    ED Treatments / Results  Labs (all labs ordered are listed, but only abnormal results are displayed) Labs Reviewed  BASIC METABOLIC PANEL - Abnormal; Notable for the following:       Result Value   Potassium 3.0 (*)    Chloride 97 (*)    Glucose, Bld 110 (*)    BUN 21 (*)    Creatinine, Ser 1.97 (*)    GFR calc non Af Amer 25 (*)    GFR calc Af Amer 29 (*)    All other components within normal limits  CBC - Abnormal; Notable for the following:    WBC 3.1 (*)    RBC 3.49 (*)    Hemoglobin 9.9 (*)    HCT 32.0 (*)    Platelets 477 (*)    All other components within normal limits  I-STAT TROPONIN, ED - Abnormal; Notable for the following:    Troponin i, poc 0.09 (*)    All other components within normal limits  BRAIN NATRIURETIC PEPTIDE  MAGNESIUM  HEPARIN  LEVEL (UNFRACTIONATED)  CBG MONITORING, ED    EKG  EKG Interpretation  Date/Time:  Friday August 19 2017 12:40:56 EDT Ventricular Rate:  102 PR Interval:  184 QRS Duration: 92 QT Interval:  378 QTC Calculation: 492 R Axis:   -21 Text Interpretation:  Carotid sinus pressure Premature ventricular complexes Low voltage QRS Cannot rule out Anterior infarct , age undetermined Abnormal ECG No old tracing to compare Confirmed by Virgel Manifold [phone]) on 08/19/2017 1:42:45 PM       Radiology Dg Chest 2 View  Result Date: 08/19/2017 CLINICAL DATA:  Shortness of Breath EXAM: CHEST  2 VIEW COMPARISON:  August 17, 2017 and November 12, 2013 FINDINGS: There is there is mild bibasilar atelectasis, stable. There is no appreciable edema or consolidation. Heart is slightly enlarged with pulmonary vascularity within normal limits. No evident adenopathy. There is degenerative change in the thoracic spine. 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 IMPRESSION: Stable bibasilar atelectasis. No edema or consolidation. Stable cardiac prominence. Electronically Signed   By: Lowella Grip III M.D.   On: 08/19/2017 13:20   Dg Chest 2 View  Result Date: 08/18/2017 CLINICAL DATA:  Progressive shortness of breath EXAM: CHEST  2 VIEW COMPARISON:  Chest x-ray of 11/12/2013 FINDINGS: The heart is mildly enlarged. There may be a tiny left pleural effusion with mild basilar atelectasis. Also there is a question of mild pulmonary vascular congestion. Right central venous line remains. There are degenerative changes throughout the thoracic spine. IMPRESSION: Possible mild pulmonary vascular congestion. Mild bibasilar atelectasis. Electronically Signed   By: Ivar Drape M.D.   On: 08/18/2017 08:22    Procedures Procedures (including critical care time)  CRITICAL CARE Performed by: Kinnie Feil   Total critical care time: 35 minutes  Critical care time was exclusive of separately billable  procedures and treating other patients.  Critical care was necessary to treat or prevent imminent or life-threatening deterioration.  Critical care was time spent personally by me on the following  activities: development of treatment plan with patient and/or surrogate as well as nursing, discussions with consultants, evaluation of patient's response to treatment, examination of patient, obtaining history from patient or surrogate, ordering and performing treatments and interventions, ordering and review of laboratory studies, ordering and review of radiographic studies, pulse oximetry and re-evaluation of patient's condition.   Medications Ordered in ED Medications  heparin ADULT infusion 100 units/mL (25000 units/268mL sodium chloride 0.45%) (1,250 Units/hr Intravenous New Bag/Given 08/19/17 1508)  aspirin chewable tablet 324 mg (324 mg Oral Given 08/19/17 1506)  heparin bolus via infusion 4,000 Units (4,000 Units Intravenous Bolus from Bag 08/19/17 1508)     Initial Impression / Assessment and Plan / ED Course  I have reviewed the triage vital signs and the nursing notes.  Pertinent labs & imaging results that were available during my care of the patient were reviewed by me and considered in my medical decision making (see chart for details).    PCP : Dr Danise Mina   Pt is a 71 y.o. female presents with chest discomfort and dyspnea on exertion, LE edema (L>R), fatigue, palpitations x 2-3 weeks.  PCP obtained troponin two days ago and was elevated 0.25.  Trop 0.09 today in ED.  Symptoms have been intermittent and on exertion only.  Pertinent risk factors include  HTN, hypercholesterolemia, DM, obesity, previous CVA.  On exam VS are wnl. She has LE edema (L>R) with left calf tenderness, no h/o DVT/PE. CXR shows slight cardiomegaly, stable. EKG shows some PVCs and possible old anterior ischemia.  Troponin 0.09, delta pending.  CBC and BMP show anemia and hypokalemia. Elevated Cr/BUN 1.97/29, slightly  above baseline. Heart score = 5.  Pt was given aspirin and heparin in ED.   71 yo with NSTEMI and high HEART score. Will consult cardiology and admit. Discussed plan with pt and family at bedside, they are agreeable with plan. Pending Mg, BNP, LE ultrasound.  Final Clinical Impressions(s) / ED Diagnoses   Final diagnoses:  Shortness of breath  Lower extremity edema    New Prescriptions New Prescriptions   No medications on file     Arlean Hopping 08/19/17 1521    Virgel Manifold, MD 08/22/17 1201

## 2017-08-19 NOTE — ED Notes (Signed)
Cardiology at bedside.

## 2017-08-19 NOTE — ED Notes (Signed)
Patient asking for food/drink.  Made aware of NPO status until cardiology is able to see patient to decide on possible procedures.

## 2017-08-19 NOTE — ED Notes (Signed)
Per Vicente Males, RN, main lab to add on BNP and Mag level. Confirmed with main lab.

## 2017-08-19 NOTE — ED Triage Notes (Signed)
Pt sent by PCP for elevated troponin level of 0.25. Pt has been having shob x 2 weeks worse with exertion, and intermittent chest discomfort. VSS. Denies CP at this time. Breath sounds clear. Pt has bilateral edema.

## 2017-08-19 NOTE — ED Notes (Addendum)
Critical troponin of 0.11.  Call placed to Dr. Rosezella [place] answering service to inform of critical value.  Awaiting return phone call.

## 2017-08-19 NOTE — ED Notes (Signed)
Cardiology okay'd patient to eat and drink, will provide sandwich bag and sprite, per request

## 2017-08-20 ENCOUNTER — Observation Stay (HOSPITAL_BASED_OUTPATIENT_CLINIC_OR_DEPARTMENT_OTHER): Payer: Medicare Other

## 2017-08-20 ENCOUNTER — Other Ambulatory Visit: Payer: Self-pay

## 2017-08-20 DIAGNOSIS — I130 Hypertensive heart and chronic kidney disease with heart failure and stage 1 through stage 4 chronic kidney disease, or unspecified chronic kidney disease: Secondary | ICD-10-CM | POA: Diagnosis present

## 2017-08-20 DIAGNOSIS — I340 Nonrheumatic mitral (valve) insufficiency: Secondary | ICD-10-CM | POA: Diagnosis not present

## 2017-08-20 DIAGNOSIS — Z794 Long term (current) use of insulin: Secondary | ICD-10-CM | POA: Diagnosis not present

## 2017-08-20 DIAGNOSIS — R7989 Other specified abnormal findings of blood chemistry: Secondary | ICD-10-CM

## 2017-08-20 DIAGNOSIS — R748 Abnormal levels of other serum enzymes: Secondary | ICD-10-CM

## 2017-08-20 DIAGNOSIS — I517 Cardiomegaly: Secondary | ICD-10-CM | POA: Diagnosis not present

## 2017-08-20 DIAGNOSIS — Z888 Allergy status to other drugs, medicaments and biological substances status: Secondary | ICD-10-CM | POA: Diagnosis not present

## 2017-08-20 DIAGNOSIS — R079 Chest pain, unspecified: Secondary | ICD-10-CM

## 2017-08-20 DIAGNOSIS — E785 Hyperlipidemia, unspecified: Secondary | ICD-10-CM

## 2017-08-20 DIAGNOSIS — E1121 Type 2 diabetes mellitus with diabetic nephropathy: Secondary | ICD-10-CM | POA: Diagnosis present

## 2017-08-20 DIAGNOSIS — R7889 Finding of other specified substances, not normally found in blood: Secondary | ICD-10-CM | POA: Diagnosis not present

## 2017-08-20 DIAGNOSIS — E876 Hypokalemia: Secondary | ICD-10-CM

## 2017-08-20 DIAGNOSIS — Z833 Family history of diabetes mellitus: Secondary | ICD-10-CM | POA: Diagnosis not present

## 2017-08-20 DIAGNOSIS — I10 Essential (primary) hypertension: Secondary | ICD-10-CM | POA: Diagnosis not present

## 2017-08-20 DIAGNOSIS — Z6837 Body mass index (BMI) 37.0-37.9, adult: Secondary | ICD-10-CM | POA: Diagnosis not present

## 2017-08-20 DIAGNOSIS — R0609 Other forms of dyspnea: Secondary | ICD-10-CM | POA: Diagnosis not present

## 2017-08-20 DIAGNOSIS — I361 Nonrheumatic tricuspid (valve) insufficiency: Secondary | ICD-10-CM

## 2017-08-20 DIAGNOSIS — Z7982 Long term (current) use of aspirin: Secondary | ICD-10-CM | POA: Diagnosis not present

## 2017-08-20 DIAGNOSIS — N183 Chronic kidney disease, stage 3 (moderate): Secondary | ICD-10-CM

## 2017-08-20 DIAGNOSIS — I5021 Acute systolic (congestive) heart failure: Secondary | ICD-10-CM | POA: Diagnosis not present

## 2017-08-20 DIAGNOSIS — I472 Ventricular tachycardia: Secondary | ICD-10-CM | POA: Diagnosis not present

## 2017-08-20 DIAGNOSIS — Z87891 Personal history of nicotine dependence: Secondary | ICD-10-CM | POA: Diagnosis not present

## 2017-08-20 DIAGNOSIS — E1122 Type 2 diabetes mellitus with diabetic chronic kidney disease: Secondary | ICD-10-CM | POA: Diagnosis present

## 2017-08-20 DIAGNOSIS — Z8249 Family history of ischemic heart disease and other diseases of the circulatory system: Secondary | ICD-10-CM | POA: Diagnosis not present

## 2017-08-20 DIAGNOSIS — I429 Cardiomyopathy, unspecified: Secondary | ICD-10-CM | POA: Diagnosis not present

## 2017-08-20 DIAGNOSIS — R0602 Shortness of breath: Secondary | ICD-10-CM | POA: Diagnosis not present

## 2017-08-20 DIAGNOSIS — I081 Rheumatic disorders of both mitral and tricuspid valves: Secondary | ICD-10-CM | POA: Diagnosis present

## 2017-08-20 DIAGNOSIS — I214 Non-ST elevation (NSTEMI) myocardial infarction: Secondary | ICD-10-CM | POA: Diagnosis not present

## 2017-08-20 DIAGNOSIS — Z79899 Other long term (current) drug therapy: Secondary | ICD-10-CM | POA: Diagnosis not present

## 2017-08-20 DIAGNOSIS — Z982 Presence of cerebrospinal fluid drainage device: Secondary | ICD-10-CM | POA: Diagnosis not present

## 2017-08-20 DIAGNOSIS — E782 Mixed hyperlipidemia: Secondary | ICD-10-CM | POA: Diagnosis not present

## 2017-08-20 DIAGNOSIS — I471 Supraventricular tachycardia: Secondary | ICD-10-CM | POA: Diagnosis not present

## 2017-08-20 DIAGNOSIS — I272 Pulmonary hypertension, unspecified: Secondary | ICD-10-CM | POA: Diagnosis not present

## 2017-08-20 LAB — ECHOCARDIOGRAM COMPLETE
Height: 69 in
Weight: 3931.2 oz

## 2017-08-20 LAB — LIPID PANEL
Cholesterol: 118 mg/dL (ref 0–200)
HDL: 36 mg/dL — AB (ref >40)
LDL Cholesterol: 67 mg/dL (ref 0–99)
TRIGLYCERIDES: 74 mg/dL (ref <150)
Total CHOL/HDL Ratio: 3.3 RATIO
VLDL: 15 mg/dL (ref 0–40)

## 2017-08-20 LAB — BASIC METABOLIC PANEL
Anion gap: 11 (ref 5–15)
BUN: 24 mg/dL — ABNORMAL HIGH (ref 6–20)
CO2: 26 mmol/L (ref 22–32)
Calcium: 8.5 mg/dL — ABNORMAL LOW (ref 8.9–10.3)
Chloride: 98 mmol/L — ABNORMAL LOW (ref 101–111)
Creatinine, Ser: 1.7 mg/dL — ABNORMAL HIGH (ref 0.44–1.00)
GFR, EST AFRICAN AMERICAN: 35 mL/min — AB (ref >60)
GFR, EST NON AFRICAN AMERICAN: 30 mL/min — AB (ref >60)
Glucose, Bld: 118 mg/dL — ABNORMAL HIGH (ref 65–99)
POTASSIUM: 3.6 mmol/L (ref 3.5–5.1)
SODIUM: 135 mmol/L (ref 135–145)

## 2017-08-20 LAB — HEPARIN LEVEL (UNFRACTIONATED): Heparin Unfractionated: 0.21 IU/mL — ABNORMAL LOW (ref 0.30–0.70)

## 2017-08-20 LAB — GLUCOSE, CAPILLARY
GLUCOSE-CAPILLARY: 97 mg/dL (ref 65–99)
GLUCOSE-CAPILLARY: 122 mg/dL — AB (ref 65–99)
Glucose-Capillary: 129 mg/dL — ABNORMAL HIGH (ref 65–99)
Glucose-Capillary: 134 mg/dL — ABNORMAL HIGH (ref 65–99)

## 2017-08-20 LAB — TROPONIN I
Troponin I: 0.13 ng/mL (ref <0.03)
Troponin I: 0.09 ng/mL (ref <0.03)

## 2017-08-20 LAB — MAGNESIUM: MAGNESIUM: 1.2 mg/dL — AB (ref 1.7–2.4)

## 2017-08-20 MED ORDER — HEPARIN BOLUS VIA INFUSION
2000.0000 [IU] | Freq: Once | INTRAVENOUS | Status: AC
  Administered 2017-08-20: 2000 [IU] via INTRAVENOUS
  Filled 2017-08-20: qty 2000

## 2017-08-20 MED ORDER — HYDRALAZINE HCL 25 MG PO TABS
25.0000 mg | ORAL_TABLET | Freq: Three times a day (TID) | ORAL | Status: DC
  Administered 2017-08-20 – 2017-08-24 (×11): 25 mg via ORAL
  Filled 2017-08-20 (×13): qty 1

## 2017-08-20 MED ORDER — MAGNESIUM OXIDE 400 (241.3 MG) MG PO TABS
400.0000 mg | ORAL_TABLET | Freq: Every day | ORAL | Status: DC
  Administered 2017-08-20: 400 mg via ORAL
  Filled 2017-08-20 (×2): qty 1

## 2017-08-20 NOTE — Progress Notes (Addendum)
Progress Note  Patient Name: Candace Long Date of Encounter: 08/20/2017  Primary Cardiologist: Dr. Percival Spanish (new)  Subjective   Denies chest pain and shortness of breath. Wants to eat. Says wants stress test tomorrow. Sitting up in chair.  Inpatient Medications    Scheduled Meds: . amLODipine  10 mg Oral Daily  . aspirin EC  81 mg Oral Daily  . atorvastatin  40 mg Oral q1800  . insulin aspart  0-15 Units Subcutaneous TID WC  . insulin aspart  0-5 Units Subcutaneous QHS  . magnesium oxide  400 mg Oral Daily  . potassium chloride SA  20 mEq Oral BID  . potassium chloride  40 mEq Oral Once  . sodium chloride flush  3 mL Intravenous Q12H   Continuous Infusions: . sodium chloride     PRN Meds: sodium chloride, acetaminophen, albuterol, nitroGLYCERIN, ondansetron (ZOFRAN) IV, sodium chloride flush   Vital Signs    Vitals:   08/19/17 2035 08/20/17 0023 08/20/17 0356 08/20/17 0919  BP: (!) 148/92 (!) 152/96 (!) 156/98 (!) 146/104  Pulse: 89 89 92 (!) 104  Resp: 20 18 18    Temp: 98.1 F (36.7 C) 98.5 F (36.9 C) 98.4 F (36.9 C)   TempSrc: Oral Oral Oral   SpO2: 100% 100% 100%   Weight: 242 lb 9.6 oz (110 kg)  245 lb 11.2 oz (111.4 kg)   Height: 5\' 9"  (1.753 m)       Intake/Output Summary (Last 24 hours) at 08/20/17 1032 Last data filed at 08/20/17 0946  Gross per 24 hour  Intake                0 ml  Output              600 ml  Net             -600 ml   Filed Weights   08/19/17 1242 08/19/17 2035 08/20/17 0356  Weight: 245 lb (111.1 kg) 242 lb 9.6 oz (110 kg) 245 lb 11.2 oz (111.4 kg)    Telemetry    NSR, sinus tach, PAC's, PVC's, 8 beat NSVT - Personally Reviewed  ECG    NSR with frequent PAC's, late R wave transition, possible old anterior infarct - Personally Reviewed  Physical Exam   GEN: No acute distress.   Neck: No JVD Cardiac: RRR, no murmurs, rubs, or gallops.  Respiratory: Bibasilar crackles GI: Soft, nontender, protuberant MS: No edema;  No deformity. Neuro:  Nonfocal  Psych: Normal affect   Labs    Chemistry Recent Labs Lab 08/17/17 1831 08/19/17 1305 08/19/17 1749  NA 137 137 136  K 3.2* 3.0* 3.3*  CL 94* 97* 98*  CO2 27 26 25   GLUCOSE 121* 110* 74  BUN 16 21* 23*  CREATININE 1.47* 1.97* 1.82*  CALCIUM 9.1 8.9 8.8*  GFRNONAA  --  25* 27*  GFRAA  --  29* 32*  ANIONGAP  --  14 13     Hematology Recent Labs Lab 08/17/17 1831 08/19/17 1305  WBC 3.9* 3.1*  RBC 3.67* 3.49*  HGB 10.7* 9.9*  HCT 33.9* 32.0*  MCV 92.4 91.7  MCH  --  28.4  MCHC 31.6 30.9  RDW 15.4 15.0  PLT 630.0* 477*    Cardiac Enzymes Recent Labs Lab 08/18/17 1647 08/19/17 1749 08/20/17 0010  TROPONINI 0.25* 0.11* 0.13*    Recent Labs Lab 08/19/17 1316 08/19/17 1624  TROPIPOC 0.09* 0.08     BNP Recent Labs Lab 08/17/17  1831 08/19/17 1305  BNP  --  566.9*  PROBNP 541.0*  --      DDimer No results for input(s): DDIMER in the last 168 hours.   Radiology    Dg Chest 2 View  Result Date: 08/19/2017 CLINICAL DATA:  Shortness of Breath EXAM: CHEST  2 VIEW COMPARISON:  August 17, 2017 and November 12, 2013 FINDINGS: There is there is mild bibasilar atelectasis, stable. There is no appreciable edema or consolidation. Heart is slightly enlarged with pulmonary vascularity within normal limits. No evident adenopathy. There is degenerative change in the thoracic spine. 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 IMPRESSION: Stable bibasilar atelectasis. No edema or consolidation. Stable cardiac prominence. Electronically Signed   By: Lowella Grip III M.D.   On: 08/19/2017 13:20    Cardiac Studies   Echo pending  Patient Profile     71 y.o. female with a history of hyperlipidemia, hypertension, obesity, insulin dependent type 2 diabetes who presents with progressive exertional dyspnea and bilateral feet swelling and occasional chest discomfort.  Assessment & Plan    1. Chest pain and exertional  dyspnea: No radiographic evidence of pulmonary edema. Currently symptomatically stable. Troponins peaked at 0.25 and then trended down. I will stop IV heparin. Will plan for Lexiscan Myoview on 08/21/17. Echocardiogram is pending. Continue ASA and Lipitor.  2. Shortness of breath/DOE: While BNP elevated, no clinical signs of heart failure. Await echocardiogram. Creatinine elevated. Will not add diuretic.  3. Hypokalemia: Will obtain BMET today. NSVT on telemetry. On supplementation.  4. NSVT: Await echo and plan for stress test tomorrow. Asymptomatic. Mg very low on admit. I will start supplementation and check Mag level today. Also check K level today.  5. HTN: ACEI on hold due to CKD. Elevated today. I will add hydralazine for now. No thiazide diuretic also due to CKD and concern for worsening renal function.  6. HLD: Continue statin.  7. CKD stage 3: Creat normal on 07/09/16. Metformin and ACEI on hold. Will need further workup as outpatient.  8. Hypomagnesemia: I will start replacement. Will check level today. NSVT on telemetry. Asymptomatic.    For questions or updates, please contact [place] Please consult [email or web address] for contact info under Cardiology/STEMI. Daytime calls, contact the Day Call APP (6a-8a) or assigned team (Teams A-D) provider (7:30a - 5p). All other daytime calls (7:30-5p), contact the Card Master @ [phone].   Nighttime calls, contact the assigned APP (5p-8p) or MD (6:30p-8p). Overnight calls (8p-6a), contact the on call Fellow @ [phone].      Signed, Kate Sable, MD  08/20/2017, 10:32 AM

## 2017-08-20 NOTE — ED Notes (Signed)
Bedside report given by Baldo Ash, RN. Introduced self to pt and family.

## 2017-08-20 NOTE — Progress Notes (Signed)
  Echocardiogram 2D Echocardiogram has been performed.  Farzad Tibbetts 08/20/2017, 3:37 PM

## 2017-08-20 NOTE — ED Notes (Signed)
Pt and family member both given [place] sandwiches by this RN. Per cardiologist, pt able to eat.

## 2017-08-20 NOTE — Progress Notes (Signed)
ANTICOAGULATION CONSULT NOTE - Follow Up Consult  Pharmacy Consult for heparin Indication: chest pain/ACS  Labs:  Recent Labs  08/17/17 1831 08/18/17 1647 08/19/17 1305 08/19/17 1749 08/20/17 0010  HGB 10.7*  --  9.9*  --   --   HCT 33.9*  --  32.0*  --   --   PLT 630.0*  --  477*  --   --   HEPARINUNFRC  --   --   --   --  0.21*  CREATININE 1.47*  --  1.97* 1.82*  --   TROPONINI  --  0.25*  --  0.11* 0.13*    Assessment: (late entry, spoke to RN ~0100) 70yo female subtherapeutic on heparin with initial dosing for elevated troponin.  Goal of Therapy:  Heparin level 0.3-0.7 units/ml   Plan:  Will rebolus with heparin 2000 units x1 and increase gtt by 2-3 units/kg/hr to 1500 units/hr and check level in [place], PharmD, BCPS  08/20/2017,2:27 AM

## 2017-08-20 NOTE — Progress Notes (Signed)
0700-1800.Marland KitchenMarland KitchenMarland KitchenMarland Kitchen Pt without c/o during shift. Alert and oreiented times 4. However at times she cam be forgetful. reorients well. Alarms in place to prevent injury

## 2017-08-21 ENCOUNTER — Other Ambulatory Visit: Payer: Self-pay

## 2017-08-21 ENCOUNTER — Inpatient Hospital Stay (HOSPITAL_COMMUNITY): Payer: Medicare Other

## 2017-08-21 DIAGNOSIS — I429 Cardiomyopathy, unspecified: Secondary | ICD-10-CM

## 2017-08-21 DIAGNOSIS — R079 Chest pain, unspecified: Secondary | ICD-10-CM

## 2017-08-21 LAB — GLUCOSE, CAPILLARY
GLUCOSE-CAPILLARY: 115 mg/dL — AB (ref 65–99)
GLUCOSE-CAPILLARY: 223 mg/dL — AB (ref 65–99)
Glucose-Capillary: 134 mg/dL — ABNORMAL HIGH (ref 65–99)
Glucose-Capillary: 168 mg/dL — ABNORMAL HIGH (ref 65–99)

## 2017-08-21 LAB — NM MYOCAR MULTI W/SPECT W/WALL MOTION / EF
CHL CUP RESTING HR STRESS: 91 {beats}/min
CHL RATE OF PERCEIVED EXERTION: 0
CSEPED: 0 min
CSEPEW: 1 METS
Exercise duration (sec): 0 s
MPHR: 152 {beats}/min
Peak HR: 100 {beats}/min
Percent HR: 65 %

## 2017-08-21 LAB — BASIC METABOLIC PANEL
Anion gap: 11 (ref 5–15)
BUN: 20 mg/dL (ref 6–20)
CALCIUM: 9.1 mg/dL (ref 8.9–10.3)
CHLORIDE: 102 mmol/L (ref 101–111)
CO2: 27 mmol/L (ref 22–32)
CREATININE: 1.5 mg/dL — AB (ref 0.44–1.00)
GFR calc non Af Amer: 35 mL/min — ABNORMAL LOW (ref >60)
GFR, EST AFRICAN AMERICAN: 40 mL/min — AB (ref >60)
Glucose, Bld: 138 mg/dL — ABNORMAL HIGH (ref 65–99)
Potassium: 3.3 mmol/L — ABNORMAL LOW (ref 3.5–5.1)
SODIUM: 140 mmol/L (ref 135–145)

## 2017-08-21 LAB — MAGNESIUM: Magnesium: 1.3 mg/dL — ABNORMAL LOW (ref 1.7–2.4)

## 2017-08-21 MED ORDER — REGADENOSON 0.4 MG/5ML IV SOLN
INTRAVENOUS | Status: AC
  Filled 2017-08-21: qty 5

## 2017-08-21 MED ORDER — MAGNESIUM SULFATE 2 GM/50ML IV SOLN
2.0000 g | Freq: Once | INTRAVENOUS | Status: AC
  Administered 2017-08-21: 2 g via INTRAVENOUS
  Filled 2017-08-21: qty 50

## 2017-08-21 MED ORDER — TECHNETIUM TC 99M TETROFOSMIN IV KIT
10.0000 | PACK | Freq: Once | INTRAVENOUS | Status: AC | PRN
  Administered 2017-08-21: 10 via INTRAVENOUS

## 2017-08-21 MED ORDER — TECHNETIUM TC 99M TETROFOSMIN IV KIT
30.0000 | PACK | Freq: Once | INTRAVENOUS | Status: AC | PRN
  Administered 2017-08-21: 30 via INTRAVENOUS

## 2017-08-21 MED ORDER — POTASSIUM CHLORIDE 20 MEQ PO PACK
40.0000 meq | PACK | Freq: Once | ORAL | Status: AC
  Filled 2017-08-21: qty 2

## 2017-08-21 MED ORDER — AMINOPHYLLINE 25 MG/ML IV SOLN
INTRAVENOUS | Status: AC
  Filled 2017-08-21: qty 10

## 2017-08-21 MED ORDER — REGADENOSON 0.4 MG/5ML IV SOLN
0.4000 mg | Freq: Once | INTRAVENOUS | Status: AC
  Administered 2017-08-21: 0.4 mg via INTRAVENOUS
  Filled 2017-08-21: qty 5

## 2017-08-21 MED ORDER — MAGNESIUM OXIDE 400 (241.3 MG) MG PO TABS
400.0000 mg | ORAL_TABLET | Freq: Two times a day (BID) | ORAL | Status: DC
  Administered 2017-08-21 – 2017-08-24 (×6): 400 mg via ORAL
  Filled 2017-08-21 (×6): qty 1

## 2017-08-21 MED ORDER — TECHNETIUM TC 99M TETROFOSMIN IV KIT: 30.0000 | PACK | Freq: Once | INTRAVENOUS | Status: DC | PRN

## 2017-08-21 NOTE — Progress Notes (Signed)
Pt DOE with wheezes and rhonchi with exertion.  With lexiscan meds SOB improved.  Test done without complications results to follow.

## 2017-08-21 NOTE — Progress Notes (Signed)
Pharmacist Heart Failure Core Measure Documentation  Assessment: Devri Kreher has an EF documented as 40% on 08/20/17 by Delos Haring.  Rationale: Heart failure patients with left ventricular systolic dysfunction (LVSD) and an EF < 40% should be prescribed an angiotensin converting enzyme inhibitor (ACEI) or angiotensin receptor blocker (ARB) at discharge unless a contraindication is documented in the medical record.  This patient is not currently on an ACEI or ARB for HF.  This note is being placed in the record in order to provide documentation that a contraindication to the use of these agents is present for this encounter.  ACE Inhibitor or Angiotensin Receptor Blocker is contraindicated (specify all that apply)  []   ACEI allergy AND ARB allergy []   Angioedema []   Moderate or severe aortic stenosis []   Hyperkalemia []   Hypotension []   Renal artery stenosis [x]   Worsening renal function, preexisting renal disease or dysfunction   Elicia Lamp, PharmD, BCPS Clinical Pharmacist 08/21/2017 3:02 PM

## 2017-08-21 NOTE — Progress Notes (Signed)
Patient has returned to unit following stress test. No acute distress and vital signs stable at this time.

## 2017-08-21 NOTE — Progress Notes (Signed)
Discussed nuc stress test results with pt and that we recommend cardiac cath.  She is agreeable, she will need to be seen early AM to see how she is and how renal function is.    The patient understands that risks included but are not limited to stroke (1 in 1000), death (1 in 18), kidney failure [usually temporary] (1 in 500), bleeding (1 in 200), allergic reaction [possibly serious] (1 in 200).

## 2017-08-21 NOTE — Progress Notes (Signed)
Progress Note  Patient Name: Candace Long Date of Encounter: 08/21/2017  Primary Cardiologist: Dr. Vita Barley   Subjective   No chest pain at present. Says she feels well. Not short of breath at present.  Inpatient Medications    Scheduled Meds: . aminophylline      . regadenoson      . amLODipine  10 mg Oral Daily  . aspirin EC  81 mg Oral Daily  . atorvastatin  40 mg Oral q1800  . hydrALAZINE  25 mg Oral Q8H  . insulin aspart  0-15 Units Subcutaneous TID WC  . insulin aspart  0-5 Units Subcutaneous QHS  . magnesium oxide  400 mg Oral Daily  . potassium chloride SA  20 mEq Oral BID  . potassium chloride  40 mEq Oral Once  . sodium chloride flush  3 mL Intravenous Q12H   Continuous Infusions: . sodium chloride     PRN Meds: sodium chloride, acetaminophen, albuterol, nitroGLYCERIN, ondansetron (ZOFRAN) IV, sodium chloride flush   Vital Signs    Vitals:   08/21/17 1015 08/21/17 1025 08/21/17 1026 08/21/17 1028  BP: (!) 166/120 (!) 182/107 (!) 179/108 (!) 173/99  Pulse:      Resp:      Temp:      TempSrc:      SpO2:      Weight:      Height:        Intake/Output Summary (Last 24 hours) at 08/21/17 1032 Last data filed at 08/21/17 0949  Gross per 24 hour  Intake             3058 ml  Output              425 ml  Net             2633 ml   Filed Weights   08/19/17 2035 08/20/17 0356 08/21/17 0539  Weight: 242 lb 9.6 oz (110 kg) 245 lb 11.2 oz (111.4 kg) 242 lb (109.8 kg)    Telemetry    SR with NSVT - Personally Reviewed  ECG    No new - Personally Reviewed  Physical Exam   GEN: No acute distress.   Neck: No JVD Cardiac: RRR, no murmurs, rubs, or gallops.  Respiratory: tight breath sounds significant rhonchi and wheezing. After exertion, improved with rest. GI: Soft, nontender, non-distended  MS: Trace lower extremity edema b/l; No deformity. Neuro:  Nonfocal  Psych: Normal affect   Labs    Chemistry Recent Labs Lab 08/19/17 1749  08/20/17 0959 08/21/17 0549  NA 136 135 140  K 3.3* 3.6 3.3*  CL 98* 98* 102  CO2 25 26 27   GLUCOSE 74 118* 138*  BUN 23* 24* 20  CREATININE 1.82* 1.70* 1.50*  CALCIUM 8.8* 8.5* 9.1  GFRNONAA 27* 30* 35*  GFRAA 32* 35* 40*  ANIONGAP 13 11 11      Hematology Recent Labs Lab 08/17/17 1831 08/19/17 1305  WBC 3.9* 3.1*  RBC 3.67* 3.49*  HGB 10.7* 9.9*  HCT 33.9* 32.0*  MCV 92.4 91.7  MCH  --  28.4  MCHC 31.6 30.9  RDW 15.4 15.0  PLT 630.0* 477*    Cardiac Enzymes Recent Labs Lab 08/18/17 1647 08/19/17 1749 08/20/17 0010 08/20/17 0959  TROPONINI 0.25* 0.11* 0.13* 0.09*    Recent Labs Lab 08/19/17 1316 08/19/17 1624  TROPIPOC 0.09* 0.08     BNP Recent Labs Lab 08/17/17 1831 08/19/17 1305  BNP  --  566.9*  PROBNP  541.0*  --      DDimer No results for input(s): DDIMER in the last 168 hours.   Radiology    Dg Chest 2 View  Result Date: 08/19/2017 CLINICAL DATA:  Shortness of Breath EXAM: CHEST  2 VIEW COMPARISON:  August 17, 2017 and November 12, 2013 FINDINGS: There is there is mild bibasilar atelectasis, stable. There is no appreciable edema or consolidation. Heart is slightly enlarged with pulmonary vascularity within normal limits. No evident adenopathy. There is degenerative change in the thoracic spine. 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 IMPRESSION: Stable bibasilar atelectasis. No edema or consolidation. Stable cardiac prominence. Electronically Signed   By: Lowella Grip III M.D.   On: 08/19/2017 13:20    Cardiac Studies   Echo  Left ventricle: The cavity size was normal. Wall thickness was   increased in a pattern of mild LVH. Systolic function was   moderately reduced. The estimated ejection fraction was 40%.   Diffuse hypokinesis. The study is not technically sufficient to   allow evaluation of LV diastolic function. Doppler parameters are   consistent with high ventricular filling pressure. - Regional wall  motion abnormality: Hypokinesis of the mid inferior   and mid inferolateral myocardium. - Aortic valve: Trileaflet; mildly thickened, mildly calcified   leaflets. - Mitral valve: There was moderate regurgitation. - Tricuspid valve: There was mild-moderate regurgitation. - Pulmonary arteries: PA peak pressure: 47 mm Hg (S). - Inferior vena cava: The vessel was dilated. The respirophasic   diameter changes were normal (> 50%), consistent with elevated   central venous pressure of 8 mm Hg.  Patient Profile     71 y.o. female with a history of hyperlipidemia, hypertension, obesity, insulin dependent type 2 diabetes who presents with progressive exertional dyspnea and bilateral feet swelling and occasional chest discomfort  Assessment & Plan    1. Chest pain and exertional dyspnea: No radiographic evidence of pulmonary edema. Currently symptomatically stable. Troponins peaked at 0.25 and then trended down. Stopped IV heparin yesterday.  Lexiscan Myoview on 08/21/17. Echocardiogram with EF 40%, mild LVH hypokinesis of the mid inf. And mid inf. Lateral myocardium.  Mod MR.  TR mild to mod Regurg. PA pressure 47 mmHg  Continue ASA and Lipitor.  2. Shortness of breath/DOE: While BNP elevated, no clinical signs of heart failure. Creatinine elevated. Will not add diuretic.  Severe SOB and wheezes with exertion.  3. Hypokalemia: still low add extra 20 meq  NSVT on telemetry. On supplementation.  4. NSVT: LVEF 40%. Await  stress test results. Asymptomatic. Mg very low on admit. I started supplementation. K low at 3.3. Will replete.  5. HTN: ACEI on hold due to CKD. Elevated today. Added hydralazine for now. No thiazide diuretic also due to CKD and concern for worsening renal function.  6. HLD: Continue statin.  7. CKD stage 3: Creat normal on 07/09/16. Metformin and ACEI on hold. Will need further workup as outpatient. Cr today 1.50   8. Hypomagnesemia:  Replacement started, I will increase  to 400 mg bid of MagOxide. Level yesterday 1.2  Will recheck stat. NSVT on telemetry. Asymptomatic.     For questions or updates, please contact [place] Please consult [email or web address] for contact info under Cardiology/STEMI. Daytime calls, contact the Day Call APP (6a-8a) or assigned team (Teams A-D) provider (7:30a - 5p). All other daytime calls (7:30-5p), contact the Card Master @ [phone].   Nighttime calls, contact the assigned APP (5p-8p) or MD (6:30p-8p). Overnight calls (  8p-6a), contact the on call Fellow @ [phone].      Signed, Cecilie Kicks, NP  08/21/2017, 10:32 AM    The patient was seen and examined, and I agree with the history, physical exam, assessment and plan as documented above. I modified the assessment and plan above.  She is currently asymptomatic. Await results of stress test. LVEF 40%. I will replete both K and Mg given multiple runs of NSVT. Hydralazine added for hypertension.   Kate Sable, MD, [facility]  08/21/2017 11:55 AM

## 2017-08-21 NOTE — Progress Notes (Addendum)
Abnormal nuc study will discuss with MD-    Reviewed with Dr. Jacinta Shoe and pt will need cardiac cath.  Will discuss with pt.

## 2017-08-22 ENCOUNTER — Inpatient Hospital Stay (HOSPITAL_COMMUNITY)
Admission: EM | Disposition: A | Discharge status: Home Health | Payer: Self-pay | Source: Home / Self Care | Attending: Cardiology

## 2017-08-22 ENCOUNTER — Other Ambulatory Visit: Payer: Self-pay

## 2017-08-22 DIAGNOSIS — R778 Other specified abnormalities of plasma proteins: Secondary | ICD-10-CM

## 2017-08-22 DIAGNOSIS — R7989 Other specified abnormal findings of blood chemistry: Secondary | ICD-10-CM

## 2017-08-22 DIAGNOSIS — E876 Hypokalemia: Secondary | ICD-10-CM

## 2017-08-22 DIAGNOSIS — I4729 Other ventricular tachycardia: Secondary | ICD-10-CM

## 2017-08-22 DIAGNOSIS — I5021 Acute systolic (congestive) heart failure: Secondary | ICD-10-CM

## 2017-08-22 DIAGNOSIS — I472 Ventricular tachycardia: Secondary | ICD-10-CM

## 2017-08-22 DIAGNOSIS — I471 Supraventricular tachycardia: Secondary | ICD-10-CM

## 2017-08-22 HISTORY — PX: RIGHT/LEFT HEART CATH AND CORONARY ANGIOGRAPHY: CATH118266

## 2017-08-22 LAB — GLUCOSE, CAPILLARY
GLUCOSE-CAPILLARY: 201 mg/dL — AB (ref 65–99)
Glucose-Capillary: 144 mg/dL — ABNORMAL HIGH (ref 65–99)
Glucose-Capillary: 144 mg/dL — ABNORMAL HIGH (ref 65–99)
Glucose-Capillary: 105 mg/dL — ABNORMAL HIGH (ref 65–99)

## 2017-08-22 LAB — BASIC METABOLIC PANEL
Anion gap: 9 (ref 5–15)
BUN: 19 mg/dL (ref 6–20)
CO2: 26 mmol/L (ref 22–32)
Calcium: 8.7 mg/dL — ABNORMAL LOW (ref 8.9–10.3)
Chloride: 102 mmol/L (ref 101–111)
Creatinine, Ser: 1.34 mg/dL — ABNORMAL HIGH (ref 0.44–1.00)
GFR calc Af Amer: 46 mL/min — ABNORMAL LOW (ref >60)
GFR calc non Af Amer: 40 mL/min — ABNORMAL LOW (ref >60)
GLUCOSE: 116 mg/dL — AB (ref 65–99)
Potassium: 3.4 mmol/L — ABNORMAL LOW (ref 3.5–5.1)
Sodium: 137 mmol/L (ref 135–145)

## 2017-08-22 LAB — CBC
HEMATOCRIT: 32.5 % — AB (ref 36.0–46.0)
HEMOGLOBIN: 10.3 g/dL — AB (ref 12.0–15.0)
MCH: 29.2 pg (ref 26.0–34.0)
MCHC: 31.7 g/dL (ref 30.0–36.0)
MCV: 92.1 fL (ref 78.0–100.0)
Platelets: 500 10*3/uL — ABNORMAL HIGH (ref 150–400)
RBC: 3.53 MIL/uL — ABNORMAL LOW (ref 3.87–5.11)
RDW: 15.6 % — ABNORMAL HIGH (ref 11.5–15.5)
WBC: 2.9 10*3/uL — ABNORMAL LOW (ref 4.0–10.5)

## 2017-08-22 LAB — POCT I-STAT 3, ART BLOOD GAS (G3+)
ACID-BASE EXCESS: 3 mmol/L — AB (ref 0.0–2.0)
ACID-BASE EXCESS: 5 mmol/L — AB (ref 0.0–2.0)
BICARBONATE: 27.1 mmol/L (ref 20.0–28.0)
Bicarbonate: 29.1 mmol/L — ABNORMAL HIGH (ref 20.0–28.0)
O2 SAT: 52 %
O2 Saturation: 91 %
PH ART: 7.472 — AB (ref 7.350–7.450)
PO2 ART: 27 mmHg — AB (ref 83.0–108.0)
TCO2: 28 mmol/L (ref 22–32)
TCO2: 30 mmol/L (ref 22–32)
pCO2 arterial: 37 mmHg (ref 32.0–48.0)
pCO2 arterial: 41.8 mmHg (ref 32.0–48.0)
pH, Arterial: 7.45 (ref 7.350–7.450)
pO2, Arterial: 56 mmHg — ABNORMAL LOW (ref 83.0–108.0)

## 2017-08-22 LAB — PROTIME-INR
INR: 1.02
Prothrombin Time: 13.3 seconds (ref 11.4–15.2)

## 2017-08-22 LAB — MAGNESIUM: Magnesium: 1.8 mg/dL (ref 1.7–2.4)

## 2017-08-22 SURGERY — RIGHT/LEFT HEART CATH AND CORONARY ANGIOGRAPHY
Anesthesia: LOCAL

## 2017-08-22 MED ORDER — LIDOCAINE HCL (PF) 1 % IJ SOLN
INTRAMUSCULAR | Status: DC | PRN
  Administered 2017-08-22: 20 mL
  Administered 2017-08-22: 10 mL

## 2017-08-22 MED ORDER — SODIUM CHLORIDE 0.9 % IV SOLN: INTRAVENOUS | Status: AC

## 2017-08-22 MED ORDER — SODIUM CHLORIDE 0.9 % IV SOLN: 250.0000 mL | INTRAVENOUS | Status: DC | PRN

## 2017-08-22 MED ORDER — ONDANSETRON HCL 4 MG/2ML IJ SOLN: 4.0000 mg | Freq: Four times a day (QID) | INTRAMUSCULAR | Status: DC | PRN

## 2017-08-22 MED ORDER — ASPIRIN 81 MG PO CHEW: 81.0000 mg | CHEWABLE_TABLET | Freq: Every day | ORAL | Status: DC

## 2017-08-22 MED ORDER — SODIUM CHLORIDE 0.9% FLUSH: 3.0000 mL | INTRAVENOUS | Status: DC | PRN

## 2017-08-22 MED ORDER — POTASSIUM CHLORIDE CRYS ER 20 MEQ PO TBCR
20.0000 meq | EXTENDED_RELEASE_TABLET | Freq: Once | ORAL | Status: AC
  Administered 2017-08-22: 20 meq via ORAL
  Filled 2017-08-22: qty 1

## 2017-08-22 MED ORDER — SODIUM CHLORIDE 0.9% FLUSH
3.0000 mL | Freq: Two times a day (BID) | INTRAVENOUS | Status: DC
  Administered 2017-08-22: 3 mL via INTRAVENOUS

## 2017-08-22 MED ORDER — LIDOCAINE HCL 2 % IJ SOLN
INTRAMUSCULAR | Status: AC
  Filled 2017-08-22: qty 10

## 2017-08-22 MED ORDER — HYDRALAZINE HCL 20 MG/ML IJ SOLN
INTRAMUSCULAR | Status: DC | PRN
  Administered 2017-08-22 (×2): 10 mg via INTRAVENOUS

## 2017-08-22 MED ORDER — HYDRALAZINE HCL 20 MG/ML IJ SOLN: 10.0000 mg | INTRAMUSCULAR | Status: DC | PRN

## 2017-08-22 MED ORDER — HEPARIN (PORCINE) IN NACL 2-0.9 UNIT/ML-% IJ SOLN
INTRAMUSCULAR | Status: AC
  Filled 2017-08-22: qty 1000

## 2017-08-22 MED ORDER — IOPAMIDOL (ISOVUE-370) INJECTION 76%
INTRAVENOUS | Status: DC | PRN
  Administered 2017-08-22: 45 mL via INTRAVENOUS

## 2017-08-22 MED ORDER — MORPHINE SULFATE (PF) 2 MG/ML IV SOLN: 2.0000 mg | INTRAVENOUS | Status: DC | PRN

## 2017-08-22 MED ORDER — ASPIRIN 81 MG PO CHEW: 81.0000 mg | CHEWABLE_TABLET | ORAL | Status: DC

## 2017-08-22 MED ORDER — SODIUM CHLORIDE 0.9% FLUSH
3.0000 mL | Freq: Two times a day (BID) | INTRAVENOUS | Status: DC
  Administered 2017-08-23: 3 mL via INTRAVENOUS

## 2017-08-22 MED ORDER — IOPAMIDOL (ISOVUE-370) INJECTION 76%
INTRAVENOUS | Status: AC
  Filled 2017-08-22: qty 100

## 2017-08-22 MED ORDER — HEPARIN (PORCINE) IN NACL 2-0.9 UNIT/ML-% IJ SOLN
INTRAMUSCULAR | Status: AC | PRN
  Administered 2017-08-22: 1000 mL

## 2017-08-22 MED ORDER — FUROSEMIDE 10 MG/ML IJ SOLN
40.0000 mg | Freq: Two times a day (BID) | INTRAMUSCULAR | Status: DC
  Administered 2017-08-22 – 2017-08-23 (×2): 40 mg via INTRAVENOUS
  Filled 2017-08-22 (×3): qty 4

## 2017-08-22 MED ORDER — SODIUM CHLORIDE 0.9 % WEIGHT BASED INFUSION: 1.0000 mL/kg/h | INTRAVENOUS | Status: DC

## 2017-08-22 MED ORDER — POTASSIUM CHLORIDE CRYS ER 20 MEQ PO TBCR
40.0000 meq | EXTENDED_RELEASE_TABLET | Freq: Two times a day (BID) | ORAL | Status: DC
  Administered 2017-08-22 – 2017-08-24 (×4): 40 meq via ORAL
  Filled 2017-08-22 (×4): qty 2

## 2017-08-22 MED ORDER — HYDRALAZINE HCL 20 MG/ML IJ SOLN
INTRAMUSCULAR | Status: AC
  Filled 2017-08-22: qty 1

## 2017-08-22 MED ORDER — ACETAMINOPHEN 325 MG PO TABS: 650.0000 mg | ORAL_TABLET | ORAL | Status: DC | PRN

## 2017-08-22 MED ORDER — SODIUM CHLORIDE 0.9 % IV SOLN
INTRAVENOUS | Status: AC | PRN
  Administered 2017-08-22: 450 mL via INTRAVENOUS

## 2017-08-22 MED ORDER — SODIUM CHLORIDE 0.9 % IV SOLN: INTRAVENOUS | Status: DC

## 2017-08-22 SURGICAL SUPPLY — 13 items
CATH INFINITI 5FR MULTPACK ANG (CATHETERS) ×2 IMPLANT
CATH SWAN GANZ 7F STRAIGHT (CATHETERS) ×2 IMPLANT
COVER PRB 48X5XTLSCP FOLD TPE (BAG) ×1 IMPLANT
COVER PROBE 5X48 (BAG) ×1
DEVICE CLOSURE MYNXGRIP 5F (Vascular Products) ×2 IMPLANT
HOVERMATT SINGLE USE (MISCELLANEOUS) ×2 IMPLANT
KIT HEART LEFT (KITS) ×2 IMPLANT
PACK CARDIAC CATHETERIZATION (CUSTOM PROCEDURE TRAY) ×2 IMPLANT
SHEATH PINNACLE 5F 10CM (SHEATH) ×2 IMPLANT
SHEATH PINNACLE 7F 10CM (SHEATH) ×2 IMPLANT
TRANSDUCER W/STOPCOCK (MISCELLANEOUS) ×2 IMPLANT
TUBING CIL FLEX 10 FLL-RA (TUBING) ×2 IMPLANT
WIRE EMERALD 3MM-J .035X150CM (WIRE) ×2 IMPLANT

## 2017-08-22 NOTE — H&P (View-Only) (Signed)
Progress Note  Patient Name: Candace Long Date of Encounter: 08/22/2017  Primary Cardiologist: Dr. Percival Spanish  Subjective   Lower extremity edema persists. Denies SOB at rest but does have increased WOB just seated. Reports she slept fine on her side. Does not recall the conversation yesterday regarding abnormal stress test. Son Margette Fast has POA - spoke with him on the phone.  Inpatient Medications    Scheduled Meds: . amLODipine  10 mg Oral Daily  . [START ON 08/23/2017] aspirin  81 mg Oral Pre-Cath  . aspirin EC  81 mg Oral Daily  . atorvastatin  40 mg Oral q1800  . hydrALAZINE  25 mg Oral Q8H  . insulin aspart  0-15 Units Subcutaneous TID WC  . insulin aspart  0-5 Units Subcutaneous QHS  . magnesium oxide  400 mg Oral BID  . potassium chloride SA  20 mEq Oral BID  . sodium chloride flush  3 mL Intravenous Q12H  . sodium chloride flush  3 mL Intravenous Q12H   Continuous Infusions: . sodium chloride    . sodium chloride    . sodium chloride     PRN Meds: sodium chloride, sodium chloride, acetaminophen, albuterol, nitroGLYCERIN, ondansetron (ZOFRAN) IV, sodium chloride flush, sodium chloride flush   Vital Signs    Vitals:   08/21/17 1153 08/21/17 1954 08/22/17 0548 08/22/17 0849  BP: (!) 169/82 (!) 138/93 (!) 168/94 137/85  Pulse: 87 90 90 94  Resp: 20 20 20  (!) 22  Temp: 97.7 F (36.5 C) 97.7 F (36.5 C) 98.3 F (36.8 C) 98 F (36.7 C)  TempSrc: Oral Oral Oral Oral  SpO2: 93% 97% 99%   Weight:   245 lb (111.1 kg)   Height:        Intake/Output Summary (Last 24 hours) at 08/22/17 0917 Last data filed at 08/22/17 0502  Gross per 24 hour  Intake              720 ml  Output              775 ml  Net              -55 ml   Filed Weights   08/20/17 0356 08/21/17 0539 08/22/17 0548  Weight: 245 lb 11.2 oz (111.4 kg) 242 lb (109.8 kg) 245 lb (111.1 kg)    Telemetry    NSR occ PACs, PVCs, brief run of atrial tach - Personally Reviewed  Physical Exam     GEN: No acute distress.  HEENT: Normocephalic, atraumatic, sclera non-icteric. Neck: No JVD or bruits. Cardiac: RRR no murmurs, rubs, or gallops.  Radials/DP/PT 1+ and equal bilaterally.  Respiratory: Clear to auscultation bilaterally. Mild increase in belly breathing at rest GI: Soft but rounded, nontender, BS +x 4. MS: no deformity. Extremities: No clubbing or cyanosis. 1+ pitting BLE edema to shins superimposed on large baseline leg habitus. Distal pedal pulses are 2+ and equal bilaterally. Neuro:  AAOx3 but memory deficits are apparent. Follows commands. Psych:  Responds to questions appropriately with a normal affect.  Labs    Chemistry Recent Labs Lab 08/20/17 [phone] 08/21/17 0549 08/22/17 0412  NA 135 140 137  K 3.6 3.3* 3.4*  CL 98* 102 102  CO2 26 27 26   GLUCOSE 118* 138* 116*  BUN 24* 20 19  CREATININE 1.70* 1.50* 1.34*  CALCIUM 8.5* 9.1 8.7*  GFRNONAA 30* 35* 40*  GFRAA 35* 40* 46*  ANIONGAP 11 11 9      Hematology Recent  Labs Lab 08/17/17 1831 08/19/17 1305 08/22/17 0412  WBC 3.9* 3.1* 2.9*  RBC 3.67* 3.49* 3.53*  HGB 10.7* 9.9* 10.3*  HCT 33.9* 32.0* 32.5*  MCV 92.4 91.7 92.1  MCH  --  28.4 29.2  MCHC 31.6 30.9 31.7  RDW 15.4 15.0 15.6*  PLT 630.0* 477* 500*    Cardiac Enzymes Recent Labs Lab 08/18/17 1647 08/19/17 1749 08/20/17 0010 08/20/17 0959  TROPONINI 0.25* 0.11* 0.13* 0.09*    Recent Labs Lab 08/19/17 1316 08/19/17 1624  TROPIPOC 0.09* 0.08     BNP Recent Labs Lab 08/17/17 1831 08/19/17 1305  BNP  --  566.9*  PROBNP 541.0*  --      DDimer No results for input(s): DDIMER in the last 168 hours.   Radiology    Nm Myocar Multi W/spect W/wall Motion / Ef  Result Date: 08/21/2017 CLINICAL DATA:  71 year old female with elevated troponins, possible acute coronary syndrome EXAM: MYOCARDIAL IMAGING WITH SPECT (REST AND PHARMACOLOGIC-STRESS) GATED LEFT VENTRICULAR WALL MOTION STUDY LEFT VENTRICULAR EJECTION FRACTION  TECHNIQUE: Standard myocardial SPECT imaging was performed after resting intravenous injection of 10 mCi Tc-110m tetrofosmin. Subsequently, intravenous infusion of Lexiscan was performed under the supervision of the Cardiology staff. At peak effect of the drug, 30 mCi Tc-87m tetrofosmin was injected intravenously and standard myocardial SPECT imaging was performed. Quantitative gated imaging was also performed to evaluate left ventricular wall motion, and estimate left ventricular ejection fraction. COMPARISON:  Chest x-ray 08/19/2017 prior nuclear medicine cardiac stress test 01/08/2013 FINDINGS: Perfusion: Diffuse dilatation of the left ventricle with relatively decreased radiotracer uptake throughout the sub endocardium on stress imaging consistent with transient ischemic dilatation. The TID ratio was 1.24 which is positive. No significant fixed defect. Wall Motion: Global hypokinesis. Left Ventricular Ejection Fraction: 95% End diastolic volume 621 ml End systolic volume 308 ml IMPRESSION: 1. Positive for transient ischemic dilatation concerning for diffuse reversible sub endocardial ischemia and severe underlying coronary artery disease. 2. Dilated left ventricle with diffuse hypokinesis. 3. Left ventricular ejection fraction 29% 4. Non invasive risk stratification*: High *2012 Appropriate Use Criteria for Coronary Revascularization Focused Update: J Am Coll Cardiol. 6578;46(9):629-528. http://content.[email or web address].aspx?articleid=[email or web address] These results will be called to the ordering clinician or representative by the Radiologist Assistant, and communication documented in the PACS or zVision Dashboard. Electronically Signed   By: Jacqulynn Cadet M.D.   On: 08/21/2017 13:39    Cardiac Studies   2D echo 08/20/17 Study Conclusions  - Left ventricle: The cavity size was normal. Wall thickness was   increased in a pattern of mild LVH. Systolic function was   moderately reduced. The estimated  ejection fraction was 40%.   Diffuse hypokinesis. The study is not technically sufficient to   allow evaluation of LV diastolic function. Doppler parameters are   consistent with high ventricular filling pressure. - Regional wall motion abnormality: Hypokinesis of the mid inferior   and mid inferolateral myocardium. - Aortic valve: Trileaflet; mildly thickened, mildly calcified   leaflets. - Mitral valve: There was moderate regurgitation. - Tricuspid valve: There was mild-moderate regurgitation. - Pulmonary arteries: PA peak pressure: 47 mm Hg (S). - Inferior vena cava: The vessel was dilated. The respirophasic   diameter changes were normal (> 50%), consistent with elevated   central venous pressure of 8 mm Hg.  Patient Profile     71 y.o. 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 Was noted to have elevated troponin, acute systolic CHF, PVCs, prolonged QT in setting of hypomagnesemia/hypokalemia.  Assessment & Plan    1. Chest pain/dyspnea with elevated troponin (question demand vs NSTEMI) - continue aspirin, statin. Has not been on heparin. Denies chest pain. Nuclear test was high risk concerning for diffuse disease. This is my first day meeting her but she appears volume overloaded. I will discuss with Dr. Meda Coffee timing of heart cath +/- initiation of heparin. The patient herself did not remember the conversation from yesterday. I reviewed risks, benefits, and procedure with her and she verbalized understanding - her son has POA so I also called him and explained risks and benefits of cardiac catheterization. These include bleeding, infection, kidney damage, stroke, heart attack, death. The patient and her son verbalized understanding of these risks and are willing  to proceed. Will d/c IV fluids while we await decision re: cath. Dr. Meda Coffee will be seeing her shortly.  2. Acute sytolic CHF, elevated PASP and CVP by echo - EF 40% by echo, 29% by nuclear stress test. Her regimen has not been well optimized. Was not taking ACEI as OP, would not yet start as inpatient due to Cr and need for cath this admission. Will discuss regimen as well as diuresis with MD - consider switching amlodipine to selective beta blocker. Yesterday she was reported to be wheezing but she is not today. She denies any prior h/o lung disease so question whether wheezing was cardiac wheezing. Given her risk factors and abnormal stress test, I am concerned about possibility of multivessel CAD leading to ischemic cardiomyopathy.  3. NSVT, brief atrial tach - NSVT quiescent on telemetry overnight but did have run of atrial tachycardia. See below re: lytes. Consider beta blocker initiation.  4. Essential HTN - follow BP with anticipated med changes.  5. Hypokalemia/hypomagnesemia - improved s/p mag sulfate, continue MagOx 400mg  BID. Etiology of persistent hypokalemia not totally clear. Has not been on any diuretic therapy. She got 38meq total yesterday and K went from 3.3->3.4. Is written to receive 72meq BID. Will give additional 83meq with AM dose, 60meq at lunch, and increase standing KCl to 45meq BID at bedtime (100 mEQ total today). F/u BMET in AM. Hopefully will stabilize with increased magnesium level.  6. CKD stage III - Cr stable today, follow.  7. Abnormal CBC - leukopenia, anemia, thrombocytosis noted. Was pending outpatient possible BMB and f/u with hematology. Will discuss this in the context of the above with MD.   8. Hyperlipidemia - LDL is 67. Continue statin.  Signed, Charlie Pitter, PA-C  08/22/2017, 9:17 AM     The patient was seen, examined and discussed with Melina Copa, PA-C and I agree with the above.   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 Troponin max 0.25-> 0.09, abnormal stress test suspicious for ischemia on the anterior wall, nsVT and atrial tachycardia on telemetry.  We will plan for a right and left sided cath today. Replace potassium, HTN controlled today.  Ena Dawley, MD 08/22/2017

## 2017-08-22 NOTE — Progress Notes (Signed)
Patient ate breakfast but will be NPO from here on out - per cath lab she will be an afternoon case. Discussed with nurse - she will obtain the formal consent from the patient's son/POA (see rounding note from today - I had already discussed with both pt and son). Hold off heparin per Dr. Meda Coffee. Annelie Boak PA-C

## 2017-08-22 NOTE — Progress Notes (Signed)
Left message for pt's son to make him aware that pt is going down for heart cath.

## 2017-08-22 NOTE — Progress Notes (Signed)
Progress Note  Patient Name: Candace Long Date of Encounter: 08/22/2017  Primary Cardiologist: Dr. Percival Spanish  Subjective   Lower extremity edema persists. Denies SOB at rest but does have increased WOB just seated. Reports she slept fine on her side. Does not recall the conversation yesterday regarding abnormal stress test. Son Margette Fast has POA - spoke with him on the phone.  Inpatient Medications    Scheduled Meds: . amLODipine  10 mg Oral Daily  . [START ON 08/23/2017] aspirin  81 mg Oral Pre-Cath  . aspirin EC  81 mg Oral Daily  . atorvastatin  40 mg Oral q1800  . hydrALAZINE  25 mg Oral Q8H  . insulin aspart  0-15 Units Subcutaneous TID WC  . insulin aspart  0-5 Units Subcutaneous QHS  . magnesium oxide  400 mg Oral BID  . potassium chloride SA  20 mEq Oral BID  . sodium chloride flush  3 mL Intravenous Q12H  . sodium chloride flush  3 mL Intravenous Q12H   Continuous Infusions: . sodium chloride    . sodium chloride    . sodium chloride     PRN Meds: sodium chloride, sodium chloride, acetaminophen, albuterol, nitroGLYCERIN, ondansetron (ZOFRAN) IV, sodium chloride flush, sodium chloride flush   Vital Signs    Vitals:   08/21/17 1153 08/21/17 1954 08/22/17 0548 08/22/17 0849  BP: (!) 169/82 (!) 138/93 (!) 168/94 137/85  Pulse: 87 90 90 94  Resp: 20 20 20  (!) 22  Temp: 97.7 F (36.5 C) 97.7 F (36.5 C) 98.3 F (36.8 C) 98 F (36.7 C)  TempSrc: Oral Oral Oral Oral  SpO2: 93% 97% 99%   Weight:   245 lb (111.1 kg)   Height:        Intake/Output Summary (Last 24 hours) at 08/22/17 0917 Last data filed at 08/22/17 0502  Gross per 24 hour  Intake              720 ml  Output              775 ml  Net              -55 ml   Filed Weights   08/20/17 0356 08/21/17 0539 08/22/17 0548  Weight: 245 lb 11.2 oz (111.4 kg) 242 lb (109.8 kg) 245 lb (111.1 kg)    Telemetry    NSR occ PACs, PVCs, brief run of atrial tach - Personally Reviewed  Physical Exam     GEN: No acute distress.  HEENT: Normocephalic, atraumatic, sclera non-icteric. Neck: No JVD or bruits. Cardiac: RRR no murmurs, rubs, or gallops.  Radials/DP/PT 1+ and equal bilaterally.  Respiratory: Clear to auscultation bilaterally. Mild increase in belly breathing at rest GI: Soft but rounded, nontender, BS +x 4. MS: no deformity. Extremities: No clubbing or cyanosis. 1+ pitting BLE edema to shins superimposed on large baseline leg habitus. Distal pedal pulses are 2+ and equal bilaterally. Neuro:  AAOx3 but memory deficits are apparent. Follows commands. Psych:  Responds to questions appropriately with a normal affect.  Labs    Chemistry Recent Labs Lab 08/20/17 [phone] 08/21/17 0549 08/22/17 0412  NA 135 140 137  K 3.6 3.3* 3.4*  CL 98* 102 102  CO2 26 27 26   GLUCOSE 118* 138* 116*  BUN 24* 20 19  CREATININE 1.70* 1.50* 1.34*  CALCIUM 8.5* 9.1 8.7*  GFRNONAA 30* 35* 40*  GFRAA 35* 40* 46*  ANIONGAP 11 11 9      Hematology Recent  Labs Lab 08/17/17 1831 08/19/17 1305 08/22/17 0412  WBC 3.9* 3.1* 2.9*  RBC 3.67* 3.49* 3.53*  HGB 10.7* 9.9* 10.3*  HCT 33.9* 32.0* 32.5*  MCV 92.4 91.7 92.1  MCH  --  28.4 29.2  MCHC 31.6 30.9 31.7  RDW 15.4 15.0 15.6*  PLT 630.0* 477* 500*    Cardiac Enzymes Recent Labs Lab 08/18/17 1647 08/19/17 1749 08/20/17 0010 08/20/17 0959  TROPONINI 0.25* 0.11* 0.13* 0.09*    Recent Labs Lab 08/19/17 1316 08/19/17 1624  TROPIPOC 0.09* 0.08     BNP Recent Labs Lab 08/17/17 1831 08/19/17 1305  BNP  --  566.9*  PROBNP 541.0*  --      DDimer No results for input(s): DDIMER in the last 168 hours.   Radiology    Nm Myocar Multi W/spect W/wall Motion / Ef  Result Date: 08/21/2017 CLINICAL DATA:  71 year old female with elevated troponins, possible acute coronary syndrome EXAM: MYOCARDIAL IMAGING WITH SPECT (REST AND PHARMACOLOGIC-STRESS) GATED LEFT VENTRICULAR WALL MOTION STUDY LEFT VENTRICULAR EJECTION FRACTION  TECHNIQUE: Standard myocardial SPECT imaging was performed after resting intravenous injection of 10 mCi Tc-76m tetrofosmin. Subsequently, intravenous infusion of Lexiscan was performed under the supervision of the Cardiology staff. At peak effect of the drug, 30 mCi Tc-19m tetrofosmin was injected intravenously and standard myocardial SPECT imaging was performed. Quantitative gated imaging was also performed to evaluate left ventricular wall motion, and estimate left ventricular ejection fraction. COMPARISON:  Chest x-ray 08/19/2017 prior nuclear medicine cardiac stress test 01/08/2013 FINDINGS: Perfusion: Diffuse dilatation of the left ventricle with relatively decreased radiotracer uptake throughout the sub endocardium on stress imaging consistent with transient ischemic dilatation. The TID ratio was 1.24 which is positive. No significant fixed defect. Wall Motion: Global hypokinesis. Left Ventricular Ejection Fraction: 95% End diastolic volume 621 ml End systolic volume 308 ml IMPRESSION: 1. Positive for transient ischemic dilatation concerning for diffuse reversible sub endocardial ischemia and severe underlying coronary artery disease. 2. Dilated left ventricle with diffuse hypokinesis. 3. Left ventricular ejection fraction 29% 4. Non invasive risk stratification*: High *2012 Appropriate Use Criteria for Coronary Revascularization Focused Update: J Am Coll Cardiol. 6578;46(9):629-528. http://content.[email or web address]?articleid=[email or web address] These results will be called to the ordering clinician or representative by the Radiologist Assistant, and communication documented in the PACS or zVision Dashboard. Electronically Signed   By: Jacqulynn Cadet M.D.   On: 08/21/2017 13:39    Cardiac Studies   2D echo 08/20/17 Study Conclusions  - Left ventricle: The cavity size was normal. Wall thickness was   increased in a pattern of mild LVH. Systolic function was   moderately reduced. The estimated  ejection fraction was 40%.   Diffuse hypokinesis. The study is not technically sufficient to   allow evaluation of LV diastolic function. Doppler parameters are   consistent with high ventricular filling pressure. - Regional wall motion abnormality: Hypokinesis of the mid inferior   and mid inferolateral myocardium. - Aortic valve: Trileaflet; mildly thickened, mildly calcified   leaflets. - Mitral valve: There was moderate regurgitation. - Tricuspid valve: There was mild-moderate regurgitation. - Pulmonary arteries: PA peak pressure: 47 mm Hg (S). - Inferior vena cava: The vessel was dilated. The respirophasic   diameter changes were normal (> 50%), consistent with elevated   central venous pressure of 8 mm Hg.  Patient Profile     71 y.o. 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 Was noted to have elevated troponin, acute systolic CHF, PVCs, prolonged QT in setting of hypomagnesemia/hypokalemia.  Assessment & Plan    1. Chest pain/dyspnea with elevated troponin (question demand vs NSTEMI) - continue aspirin, statin. Has not been on heparin. Denies chest pain. Nuclear test was high risk concerning for diffuse disease. This is my first day meeting her but she appears volume overloaded. I will discuss with Dr. Meda Coffee timing of heart cath +/- initiation of heparin. The patient herself did not remember the conversation from yesterday. I reviewed risks, benefits, and procedure with her and she verbalized understanding - her son has POA so I also called him and explained risks and benefits of cardiac catheterization. These include bleeding, infection, kidney damage, stroke, heart attack, death. The patient and her son verbalized understanding of these risks and are willing  to proceed. Will d/c IV fluids while we await decision re: cath. Dr. Meda Coffee will be seeing her shortly.  2. Acute sytolic CHF, elevated PASP and CVP by echo - EF 40% by echo, 29% by nuclear stress test. Her regimen has not been well optimized. Was not taking ACEI as OP, would not yet start as inpatient due to Cr and need for cath this admission. Will discuss regimen as well as diuresis with MD - consider switching amlodipine to selective beta blocker. Yesterday she was reported to be wheezing but she is not today. She denies any prior h/o lung disease so question whether wheezing was cardiac wheezing. Given her risk factors and abnormal stress test, I am concerned about possibility of multivessel CAD leading to ischemic cardiomyopathy.  3. NSVT, brief atrial tach - NSVT quiescent on telemetry overnight but did have run of atrial tachycardia. See below re: lytes. Consider beta blocker initiation.  4. Essential HTN - follow BP with anticipated med changes.  5. Hypokalemia/hypomagnesemia - improved s/p mag sulfate, continue MagOx 400mg  BID. Etiology of persistent hypokalemia not totally clear. Has not been on any diuretic therapy. She got 7meq total yesterday and K went from 3.3->3.4. Is written to receive 60meq BID. Will give additional 67meq with AM dose, 60meq at lunch, and increase standing KCl to 19meq BID at bedtime (100 mEQ total today). F/u BMET in AM. Hopefully will stabilize with increased magnesium level.  6. CKD stage III - Cr stable today, follow.  7. Abnormal CBC - leukopenia, anemia, thrombocytosis noted. Was pending outpatient possible BMB and f/u with hematology. Will discuss this in the context of the above with MD.   8. Hyperlipidemia - LDL is 67. Continue statin.  Signed, Charlie Pitter, PA-C  08/22/2017, 9:17 AM     The patient was seen, examined and discussed with Melina Copa, PA-C and I agree with the above.   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 Troponin max 0.25-> 0.09, abnormal stress test suspicious for ischemia on the anterior wall, nsVT and atrial tachycardia on telemetry.  We will plan for a right and left sided cath today. Replace potassium, HTN controlled today.  Ena Dawley, MD 08/22/2017

## 2017-08-22 NOTE — Progress Notes (Signed)
Patient got up twice before bed rest was over. Keep educating patient that she should stay in the bed due to procedure however she continues with waking and insisting to go to the bathroom. Site is without complication, with old blood drainage from previous shift, no active bleeding or hematoma. Will keep monitor on the patient.  Labib Cwynar, RN

## 2017-08-22 NOTE — Interval H&P Note (Signed)
Cath Lab Visit (complete for each Cath Lab visit)  Clinical Evaluation Leading to the Procedure:   ACS: Yes.    Non-ACS:    Anginal Classification: CCS II  Anti-ischemic medical therapy: Minimal Therapy (1 class of medications)  Non-Invasive Test Results: High-risk stress test findings: cardiac mortality >3%/year  Prior CABG: No previous CABG      History and Physical Interval Note:  08/22/2017 4:04 PM  Candace Long  has presented today for surgery, with the diagnosis of NSTEMI  The various methods of treatment have been discussed with the patient and family. After consideration of risks, benefits and other options for treatment, the patient has consented to  Procedure(s): RIGHT/LEFT HEART CATH AND CORONARY ANGIOGRAPHY (N/A) as a surgical intervention .  The patient's history has been reviewed, patient examined, no change in status, stable for surgery.  I have reviewed the patient's chart and labs.  Questions were answered to the patient's satisfaction.     Quay Burow

## 2017-08-23 ENCOUNTER — Encounter (HOSPITAL_COMMUNITY): Payer: Self-pay | Admitting: Cardiovascular Disease

## 2017-08-23 DIAGNOSIS — E782 Mixed hyperlipidemia: Secondary | ICD-10-CM

## 2017-08-23 LAB — BASIC METABOLIC PANEL
Anion gap: 8 (ref 5–15)
BUN: 15 mg/dL (ref 6–20)
CALCIUM: 9.3 mg/dL (ref 8.9–10.3)
CO2: 28 mmol/L (ref 22–32)
CREATININE: 1.31 mg/dL — AB (ref 0.44–1.00)
Chloride: 104 mmol/L (ref 101–111)
GFR, EST AFRICAN AMERICAN: 47 mL/min — AB (ref >60)
GFR, EST NON AFRICAN AMERICAN: 41 mL/min — AB (ref >60)
GLUCOSE: 143 mg/dL — AB (ref 65–99)
Potassium: 4.1 mmol/L (ref 3.5–5.1)
Sodium: 140 mmol/L (ref 135–145)

## 2017-08-23 LAB — CBC
HCT: 33.3 % — ABNORMAL LOW (ref 36.0–46.0)
Hemoglobin: 10.1 g/dL — ABNORMAL LOW (ref 12.0–15.0)
MCH: 28.4 pg (ref 26.0–34.0)
MCHC: 30.3 g/dL (ref 30.0–36.0)
MCV: 93.5 fL (ref 78.0–100.0)
PLATELETS: 491 10*3/uL — AB (ref 150–400)
RBC: 3.56 MIL/uL — ABNORMAL LOW (ref 3.87–5.11)
RDW: 15.7 % — AB (ref 11.5–15.5)
WBC: 2.9 10*3/uL — AB (ref 4.0–10.5)

## 2017-08-23 LAB — MAGNESIUM: MAGNESIUM: 1.9 mg/dL (ref 1.7–2.4)

## 2017-08-23 LAB — GLUCOSE, CAPILLARY
GLUCOSE-CAPILLARY: 134 mg/dL — AB (ref 65–99)
GLUCOSE-CAPILLARY: 200 mg/dL — AB (ref 65–99)
Glucose-Capillary: 137 mg/dL — ABNORMAL HIGH (ref 65–99)
Glucose-Capillary: 123 mg/dL — ABNORMAL HIGH (ref 65–99)

## 2017-08-23 MED ORDER — ISOSORBIDE MONONITRATE ER 30 MG PO TB24
30.0000 mg | ORAL_TABLET | Freq: Every day | ORAL | Status: DC
  Administered 2017-08-23 – 2017-08-24 (×2): 30 mg via ORAL
  Filled 2017-08-23 (×2): qty 1

## 2017-08-23 NOTE — Progress Notes (Signed)
Patient without IV, refusing new one. Son at bedside, aware. Paged NP Tamala Julian and informed. Will continue to monitor.  Bricia Taher, RN

## 2017-08-23 NOTE — Progress Notes (Signed)
Progress Note  Patient Name: Candace Long Date of Encounter: 08/23/2017  Primary Cardiologist: Dr. Percival Spanish  Subjective   The patient is significantly improved, but not at baseline yet, still SOB while walking.  Inpatient Medications    Scheduled Meds: . amLODipine  10 mg Oral Daily  . aspirin EC  81 mg Oral Daily  . atorvastatin  40 mg Oral q1800  . furosemide  40 mg Intravenous BID  . hydrALAZINE  25 mg Oral Q8H  . insulin aspart  0-15 Units Subcutaneous TID WC  . insulin aspart  0-5 Units Subcutaneous QHS  . magnesium oxide  400 mg Oral BID  . potassium chloride SA  40 mEq Oral BID  . sodium chloride flush  3 mL Intravenous Q12H  . sodium chloride flush  3 mL Intravenous Q12H   Continuous Infusions: . sodium chloride     PRN Meds: sodium chloride, acetaminophen, albuterol, hydrALAZINE, morphine injection, nitroGLYCERIN, ondansetron (ZOFRAN) IV, sodium chloride flush, sodium chloride flush   Vital Signs    Vitals:   08/22/17 2127 08/23/17 0100 08/23/17 0452 08/23/17 0738  BP: (!) 139/93 128/84 (!) 159/90 (!) 149/87  Pulse: 90 95 95 89  Resp: 18 18 18    Temp:  (!) 97.4 F (36.3 C) 97.6 F (36.4 C)   TempSrc:  Oral Oral   SpO2: 100% 99% 99% 97%  Weight:   238 lb 4.8 oz (108.1 kg)   Height:        Intake/Output Summary (Last 24 hours) at 08/23/17 0842 Last data filed at 08/23/17 0544  Gross per 24 hour  Intake              530 ml  Output             2950 ml  Net            -2420 ml   Filed Weights   08/21/17 0539 08/22/17 0548 08/23/17 0452  Weight: 242 lb (109.8 kg) 245 lb (111.1 kg) 238 lb 4.8 oz (108.1 kg)    Telemetry    NSR occ PACs, PVCs, brief run of atrial tach - Personally Reviewed  Physical Exam   GEN: No acute distress.  HEENT: Normocephalic, atraumatic, sclera non-icteric. Neck: No JVD or bruits. Cardiac: RRR no murmurs, rubs, or gallops.  Radials/DP/PT 1+ and equal bilaterally.  Respiratory: Clear to auscultation bilaterally.  Mild increase in belly breathing at rest GI: Soft but rounded, nontender, BS +x 4. MS: no deformity. Extremities: No clubbing or cyanosis. 1+ pitting BLE edema to shins superimposed on large baseline leg habitus. Distal pedal pulses are 2+ and equal bilaterally. Neuro:  AAOx3 but memory deficits are apparent. Follows commands. Psych:  Responds to questions appropriately with a normal affect.  Labs    Chemistry  Recent Labs Lab 08/21/17 0549 08/22/17 0412 08/23/17 0539  NA 140 137 140  K 3.3* 3.4* 4.1  CL 102 102 104  CO2 27 26 28   GLUCOSE 138* 116* 143*  BUN 20 19 15   CREATININE 1.50* 1.34* 1.31*  CALCIUM 9.1 8.7* 9.3  GFRNONAA 35* 40* 41*  GFRAA 40* 46* 47*  ANIONGAP 11 9 8      Hematology  Recent Labs Lab 08/19/17 1305 08/22/17 0412 08/23/17 0539  WBC 3.1* 2.9* 2.9*  RBC 3.49* 3.53* 3.56*  HGB 9.9* 10.3* 10.1*  HCT 32.0* 32.5* 33.3*  MCV 91.7 92.1 93.5  MCH 28.4 29.2 28.4  MCHC 30.9 31.7 30.3  RDW 15.0 15.6* 15.7*  PLT  477* 500* 491*    Cardiac Enzymes  Recent Labs Lab 08/18/17 1647 08/19/17 1749 08/20/17 0010 08/20/17 0959  TROPONINI 0.25* 0.11* 0.13* 0.09*     Recent Labs Lab 08/19/17 1316 08/19/17 1624  TROPIPOC 0.09* 0.08     BNP  Recent Labs Lab 08/17/17 1831 08/19/17 1305  BNP  --  566.9*  PROBNP 541.0*  --      DDimer No results for input(s): DDIMER in the last 168 hours.   Radiology    Nm Myocar Multi W/spect W/wall Motion / Ef  Result Date: 08/21/2017 CLINICAL DATA:  71 year old female with elevated troponins, possible acute coronary syndrome EXAM: MYOCARDIAL IMAGING WITH SPECT (REST AND PHARMACOLOGIC-STRESS) GATED LEFT VENTRICULAR WALL MOTION STUDY LEFT VENTRICULAR EJECTION FRACTION TECHNIQUE: Standard myocardial SPECT imaging was performed after resting intravenous injection of 10 mCi Tc-22m tetrofosmin. Subsequently, intravenous infusion of Lexiscan was performed under the supervision of the Cardiology staff. At peak effect  of the drug, 30 mCi Tc-21m tetrofosmin was injected intravenously and standard myocardial SPECT imaging was performed. Quantitative gated imaging was also performed to evaluate left ventricular wall motion, and estimate left ventricular ejection fraction. COMPARISON:  Chest x-ray 08/19/2017 prior nuclear medicine cardiac stress test 01/08/2013 FINDINGS: Perfusion: Diffuse dilatation of the left ventricle with relatively decreased radiotracer uptake throughout the sub endocardium on stress imaging consistent with transient ischemic dilatation. The TID ratio was 1.24 which is positive. No significant fixed defect. Wall Motion: Global hypokinesis. Left Ventricular Ejection Fraction: 83% End diastolic volume 382 ml End systolic volume 505 ml IMPRESSION: 1. Positive for transient ischemic dilatation concerning for diffuse reversible sub endocardial ischemia and severe underlying coronary artery disease. 2. Dilated left ventricle with diffuse hypokinesis. 3. Left ventricular ejection fraction 29% 4. Non invasive risk stratification*: High *2012 Appropriate Use Criteria for Coronary Revascularization Focused Update: J Am Coll Cardiol. 3976;73(4):193-790. http://[email or web address]?articleid=[email or web address] These results will be called to the ordering clinician or representative by the Radiologist Assistant, and communication documented in the PACS or zVision Dashboard. Electronically Signed   By: Jacqulynn Cadet M.D.   On: 08/21/2017 13:39    Cardiac Studies   2D echo 08/20/17 Study Conclusions  - Left ventricle: The cavity size was normal. Wall thickness was   increased in a pattern of mild LVH. Systolic function was   moderately reduced. The estimated ejection fraction was 40%.   Diffuse hypokinesis. The study is not technically sufficient to   allow evaluation of LV diastolic function. Doppler parameters are   consistent with high ventricular filling pressure. - Regional wall motion abnormality:  Hypokinesis of the mid inferior   and mid inferolateral myocardium. - Aortic valve: Trileaflet; mildly thickened, mildly calcified   leaflets. - Mitral valve: There was moderate regurgitation. - Tricuspid valve: There was mild-moderate regurgitation. - Pulmonary arteries: PA peak pressure: 47 mm Hg (S). - Inferior vena cava: The vessel was dilated. The respirophasic   diameter changes were normal (> 50%), consistent with elevated   central venous pressure of 8 mm Hg.   Patient Profile     71 y.o. 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 Was noted to have elevated troponin, acute systolic CHF, PVCs, prolonged QT in setting of hypomagnesemia/hypokalemia.  Assessment & Plan    1. Chest pain/dyspnea with elevated troponin (question demand vs NSTEMI) -  continue aspirin, statin. Has not been on heparin. Denies chest pain. Nuclear test was high risk concerning for diffuse disease. This is my first day meeting her but she appears volume overloaded. I will discuss with Dr. Meda Coffee timing of heart cath +/- initiation of heparin. The patient herself did not remember the conversation from yesterday. I reviewed risks, benefits, and procedure with her and she verbalized understanding - her son has POA so I also called him and explained risks and benefits of cardiac catheterization. These include bleeding, infection, kidney damage, stroke, heart attack, death. The patient and her son verbalized understanding of these risks and are willing to proceed. Will d/c IV fluids while we await decision re: cath. Dr. Meda Coffee will be seeing her shortly.  2. Acute sytolic CHF, elevated PASP and CVP by echo - EF 40% by echo, 29% by nuclear stress test. Her regimen has not been well optimized. Was  not taking ACEI as OP, would not yet start as inpatient due to Cr and need for cath this admission. Will discuss regimen as well as diuresis with MD - consider switching amlodipine to selective beta blocker. Yesterday she was reported to be wheezing but she is not today. She denies any prior h/o lung disease so question whether wheezing was cardiac wheezing. Given her risk factors and abnormal stress test, I am concerned about possibility of multivessel CAD leading to ischemic cardiomyopathy.  3. NSVT, brief atrial tach - NSVT quiescent on telemetry overnight but did have run of atrial tachycardia. See below re: lytes. Consider beta blocker initiation.  4. Essential HTN - follow BP with anticipated med changes.  5. Hypokalemia/hypomagnesemia - improved s/p mag sulfate, continue MagOx 400mg  BID. Etiology of persistent hypokalemia not totally clear. Has not been on any diuretic therapy. She got 33meq total yesterday and K went from 3.3->3.4. Is written to receive 46meq BID. Will give additional 64meq with AM dose, 63meq at lunch, and increase standing KCl to 47meq BID at bedtime (100 mEQ total today). F/u BMET in AM. Hopefully will stabilize with increased magnesium level.  6. CKD stage III - Cr stable today, follow.  7. Abnormal CBC - leukopenia, anemia, thrombocytosis noted. Was pending outpatient possible BMB and f/u with hematology. Will discuss this in the context of the above with MD.   8. Hyperlipidemia - LDL is 67. Continue statin.  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 Troponin max 0.25-> 0.09, abnormal stress test  suspicious for ischemia on the anterior wall, nsVT and atrial tachycardia on telemetry.  Cath yesterday showed clean coronary arteries, moderate LV dysfunction and elevated filling pressures along with pulmonary hypertension. She has a nonischemic cardiomyopathy. Given her LVEDP of 33 and her elevated wedge and PA pressures, I suspect she needs continued diuresis and optimization of heart failure medications. She diuresed 2.4 L overnight, we will continue iv diuresis till tomorrow, and plan for a discharge in the morning with Lasix 40 mg PO BID, no diuretics prior to admission.  Add imdur 30 mg po daily. Crea, K stable.  Ena Dawley, MD 08/23/2017

## 2017-08-23 NOTE — Progress Notes (Signed)
Patient with no complaints or concerns during 7pm - 7am shift, denies pain, however patient was up all night due to frequent urination. Procedure site without complication.  Will continue to monitor.  Billy Turvey, RN

## 2017-08-23 NOTE — Progress Notes (Signed)
Paged on call Card PA regarding pt removing IV site and not getting lasix.

## 2017-08-24 ENCOUNTER — Other Ambulatory Visit: Payer: Self-pay | Admitting: Physician Assistant

## 2017-08-24 ENCOUNTER — Telehealth: Payer: Self-pay

## 2017-08-24 DIAGNOSIS — I272 Pulmonary hypertension, unspecified: Secondary | ICD-10-CM

## 2017-08-24 DIAGNOSIS — E876 Hypokalemia: Secondary | ICD-10-CM

## 2017-08-24 DIAGNOSIS — I428 Other cardiomyopathies: Secondary | ICD-10-CM

## 2017-08-24 LAB — GLUCOSE, CAPILLARY
GLUCOSE-CAPILLARY: 121 mg/dL — AB (ref 65–99)
GLUCOSE-CAPILLARY: 157 mg/dL — AB (ref 65–99)
Glucose-Capillary: 150 mg/dL — ABNORMAL HIGH (ref 65–99)

## 2017-08-24 LAB — MAGNESIUM: Magnesium: 2 mg/dL (ref 1.7–2.4)

## 2017-08-24 MED ORDER — AMLODIPINE BESYLATE 10 MG PO TABS
10.0000 mg | ORAL_TABLET | Freq: Every day | ORAL | Status: DC
Start: 2017-08-24 — End: 2017-08-24
  Administered 2017-08-24: 10 mg via ORAL
  Filled 2017-08-24: qty 1

## 2017-08-24 MED ORDER — HYDRALAZINE HCL 25 MG PO TABS: 25.0000 mg | ORAL_TABLET | Freq: Once | ORAL | Status: DC

## 2017-08-24 MED ORDER — HYDRALAZINE HCL 50 MG PO TABS: 50.0000 mg | ORAL_TABLET | Freq: Three times a day (TID) | ORAL | 1 refills | Status: DC

## 2017-08-24 MED ORDER — HYDRALAZINE HCL 50 MG PO TABS: 50.0000 mg | ORAL_TABLET | Freq: Three times a day (TID) | ORAL | Status: DC

## 2017-08-24 MED ORDER — FUROSEMIDE 40 MG PO TABS
40.0000 mg | ORAL_TABLET | Freq: Two times a day (BID) | ORAL | Status: DC
  Administered 2017-08-24: 40 mg via ORAL
  Filled 2017-08-24: qty 1

## 2017-08-24 MED ORDER — LIVING BETTER WITH HEART FAILURE BOOK: Freq: Once | Status: DC

## 2017-08-24 MED ORDER — ISOSORBIDE MONONITRATE ER 30 MG PO TB24: 30.0000 mg | ORAL_TABLET | Freq: Every day | ORAL | 1 refills | Status: DC

## 2017-08-24 MED ORDER — POTASSIUM CHLORIDE CRYS ER 20 MEQ PO TBCR: 40.0000 meq | EXTENDED_RELEASE_TABLET | Freq: Two times a day (BID) | ORAL | 1 refills | Status: DC

## 2017-08-24 MED ORDER — MAGNESIUM OXIDE 400 (241.3 MG) MG PO TABS: 400.0000 mg | ORAL_TABLET | Freq: Two times a day (BID) | ORAL | 1 refills | Status: DC

## 2017-08-24 MED ORDER — HYDRALAZINE HCL 25 MG PO TABS: 25.0000 mg | ORAL_TABLET | Freq: Three times a day (TID) | ORAL | 1 refills | Status: DC

## 2017-08-24 MED ORDER — FUROSEMIDE 40 MG PO TABS: 40.0000 mg | ORAL_TABLET | Freq: Two times a day (BID) | ORAL | 1 refills | Status: DC

## 2017-08-24 MED FILL — Lidocaine HCl Local Inj 2%: INTRAMUSCULAR | Qty: 20 | Status: AC

## 2017-08-24 NOTE — Evaluation (Addendum)
Physical Therapy Evaluation Patient Details Name: Candace Long MRN: [id number] DOB: 1946/02/11 Today's Date: 08/24/2017   History of Present Illness  Pt is a 71 y/o female admitted secondary to acute systolic CHF. Pt is s/p heart cath which indicated pulmonary HTN and LV dysfunction. Echo results indicated ischemia to anterior wall of heart. PMH includes HTN, CKD, and thrombocytosis.   Clinical Impression  Pt admitted secondary to problem above with deficits below. PTA, pt was independent with mobility, however, did report she needed occasional assist when navigating stairs. Upon eval, pt limited by SOB and slightly decreased balance. Required standing rest in hallway secondary to SOB, however, oxygen sats >90% on RA. Do not feel pt will need any follow up PT at this time. Will continue to follow acutely to continue to progress mobility and increase independence and safety with functional mobility.     Follow Up Recommendations No PT follow up;Supervision - Intermittent    Equipment Recommendations  None recommended by PT    Recommendations for Other Services       Precautions / Restrictions Precautions Precautions: Fall Restrictions Weight Bearing Restrictions: No      Mobility  Bed Mobility               General bed mobility comments: In chair upon entry   Transfers Overall transfer level: Needs assistance Equipment used: None Transfers: Sit to/from Stand Sit to Stand: Min guard         General transfer comment: Min guard for safety. Overall steady upon standing.   Ambulation/Gait Ambulation/Gait assistance: Min guard Ambulation Distance (Feet): 100 Feet Assistive device: None Gait Pattern/deviations: Step-through pattern;Decreased stride length Gait velocity: Decreased Gait velocity interpretation: Below normal speed for age/gender General Gait Details: Overall steady gait. Able to perform horizontal and vertical head turns without LOB. Pt becoming SOB and  required standing rest. DOE 3/4. Cues for pursed lip breathing. OXygen sats >90% on RA.   Stairs            Wheelchair Mobility    Modified Rankin (Stroke Patients Only)       Balance Overall balance assessment: Needs assistance Sitting-balance support: No upper extremity supported;Feet supported Sitting balance-Leahy Scale: Good     Standing balance support: No upper extremity supported;During functional activity Standing balance-Leahy Scale: Fair                   Standardized Balance Assessment Standardized Balance Assessment : Dynamic Gait Index   Dynamic Gait Index Level Surface: Mild Impairment Gait with Horizontal Head Turns: Mild Impairment Gait with Vertical Head Turns: Mild Impairment Gait and Pivot Turn: Mild Impairment       Pertinent Vitals/Pain Pain Assessment: No/denies pain    Home Living Family/patient expects to be discharged to:: Private residence Living Arrangements: Children Available Help at Discharge: Family;Available PRN/intermittently Type of Home: House Home Access: Stairs to enter Entrance Stairs-Rails: None Entrance Stairs-Number of Steps: 3 Home Layout: One level Home Equipment: None      Prior Function Level of Independence: Needs assistance   Gait / Transfers Assistance Needed: Son sometimes helps with stairs. Otherwise independent.            Hand Dominance   Dominant Hand: Right    Extremity/Trunk Assessment   Upper Extremity Assessment Upper Extremity Assessment: Overall WFL for tasks assessed    Lower Extremity Assessment Lower Extremity Assessment: Overall WFL for tasks assessed    Cervical / Trunk Assessment Cervical / Trunk Assessment: Kyphotic  Communication  Communication: No difficulties  Cognition Arousal/Alertness: Awake/alert Behavior During Therapy: WFL for tasks assessed/performed Overall Cognitive Status: Within Functional Limits for tasks assessed                                         General Comments General comments (skin integrity, edema, etc.): Discussed energy conservation and activity pacing with pt.     Exercises     Assessment/Plan    PT Assessment Patient needs continued PT services  PT Problem List Cardiopulmonary status limiting activity;Decreased knowledge of use of DME;Decreased mobility;Decreased knowledge of precautions;Decreased balance       PT Treatment Interventions Gait training;Stair training;Functional mobility training;Therapeutic activities;Therapeutic exercise;Balance training;Patient/family education    PT Goals (Current goals can be found in the Care Plan section)  Acute Rehab PT Goals Patient Stated Goal: to go home  PT Goal Formulation: With patient Time For Goal Achievement: 08/31/17 Potential to Achieve Goals: Good    Frequency Min 3X/week   Barriers to discharge        Co-evaluation               AM-PAC PT "6 Clicks" Daily Activity  Outcome Measure Difficulty turning over in bed (including adjusting bedclothes, sheets and blankets)?: A Little Difficulty moving from lying on back to sitting on the side of the bed? : A Little Difficulty sitting down on and standing up from a chair with arms (e.g., wheelchair, bedside commode, etc,.)?: Unable Help needed moving to and from a bed to chair (including a wheelchair)?: A Little Help needed walking in hospital room?: A Little Help needed climbing 3-5 steps with a railing? : A Little 6 Click Score: 16    End of Session Equipment Utilized During Treatment: Gait belt Activity Tolerance: Treatment limited secondary to medical complications (Comment) (SOB ) Patient left: in chair;with call bell/phone within reach Nurse Communication: Mobility status PT Visit Diagnosis: Other abnormalities of gait and mobility (R26.89)    Time: 4650-3546 PT Time Calculation (min) (ACUTE ONLY): 17 min   Charges:   PT Evaluation $PT Eval Moderate Complexity: 1 Mod      PT G Codes:        Leighton Ruff, PT, DPT  Acute Rehabilitation Services  Pager: [place] 08/24/2017, 2:13 PM

## 2017-08-24 NOTE — Care Management Important Message (Signed)
Important Message  Patient Details  Name: Candace Long MRN: [id number] Date of Birth: 1946/07/29   Medicare Important Message Given:  Yes    Marah Park [address] 08/24/2017, 12:46 PM

## 2017-08-24 NOTE — Progress Notes (Signed)
CM spoke with pt's son, called me to request [place] orders - they feel she would benefit from PT, RN, aide, social work. Orders entered. Have also updated AVS to reflect that Monday's bloodwork can be obtained by [facility] if needed. Jalisha Enneking PA-C

## 2017-08-24 NOTE — Telephone Encounter (Signed)
Appt 09-01-17 @3pm  with Rosaria Ferries, PA-C

## 2017-08-24 NOTE — Progress Notes (Addendum)
Progress Note  Patient Name: Candace Long Date of Encounter: 08/24/2017  Primary Cardiologist: Dr. Percival Spanish  Subjective   The patient is significantly improved, SOB improved with walking.  Inpatient Medications    Scheduled Meds: . amLODipine  10 mg Oral Daily  . aspirin EC  81 mg Oral Daily  . atorvastatin  40 mg Oral q1800  . furosemide  40 mg Intravenous BID  . hydrALAZINE  25 mg Oral Q8H  . insulin aspart  0-15 Units Subcutaneous TID WC  . insulin aspart  0-5 Units Subcutaneous QHS  . isosorbide mononitrate  30 mg Oral Daily  . magnesium oxide  400 mg Oral BID  . potassium chloride SA  40 mEq Oral BID  . sodium chloride flush  3 mL Intravenous Q12H  . sodium chloride flush  3 mL Intravenous Q12H   Continuous Infusions: . sodium chloride     PRN Meds: sodium chloride, acetaminophen, albuterol, hydrALAZINE, morphine injection, nitroGLYCERIN, ondansetron (ZOFRAN) IV, sodium chloride flush, sodium chloride flush   Vital Signs    Vitals:   08/23/17 0738 08/23/17 1130 08/23/17 2148 08/24/17 0541  BP: (!) 149/87 134/88 133/82 (!) 147/78  Pulse: 89 86 85 94  Resp:   20 20  Temp:   97.7 F (36.5 C) (!) 97.4 F (36.3 C)  TempSrc:   Oral Oral  SpO2: 97% 100% 100% 97%  Weight:    243 lb 12.8 oz (110.6 kg)  Height:        Intake/Output Summary (Last 24 hours) at 08/24/17 0819 Last data filed at 08/24/17 0603  Gross per 24 hour  Intake              240 ml  Output              550 ml  Net             -310 ml   Filed Weights   08/22/17 0548 08/23/17 0452 08/24/17 0541  Weight: 245 lb (111.1 kg) 238 lb 4.8 oz (108.1 kg) 243 lb 12.8 oz (110.6 kg)    Telemetry    NSR occ PACs, PVCs, brief run of atrial tach - Personally Reviewed  Physical Exam   GEN: No acute distress.  HEENT: Normocephalic, atraumatic, sclera non-icteric. Neck: No JVD or bruits. Cardiac: RRR no murmurs, rubs, or gallops.  Radials/DP/PT 1+ and equal bilaterally.  Respiratory: Clear to  auscultation bilaterally. GI: Soft but rounded, nontender, BS +x 4. MS: no deformity. Extremities: No clubbing or cyanosis. Minimal B/L edema. Distal pedal pulses are 2+ and equal bilaterally. Neuro:  AAOx3 but memory deficits are apparent. Follows commands. Psych:  Responds to questions appropriately with a normal affect.  Labs    Chemistry  Recent Labs Lab 08/21/17 0549 08/22/17 0412 08/23/17 0539  NA 140 137 140  K 3.3* 3.4* 4.1  CL 102 102 104  CO2 27 26 28   GLUCOSE 138* 116* 143*  BUN 20 19 15   CREATININE 1.50* 1.34* 1.31*  CALCIUM 9.1 8.7* 9.3  GFRNONAA 35* 40* 41*  GFRAA 40* 46* 47*  ANIONGAP 11 9 8      Hematology  Recent Labs Lab 08/19/17 1305 08/22/17 0412 08/23/17 0539  WBC 3.1* 2.9* 2.9*  RBC 3.49* 3.53* 3.56*  HGB 9.9* 10.3* 10.1*  HCT 32.0* 32.5* 33.3*  MCV 91.7 92.1 93.5  MCH 28.4 29.2 28.4  MCHC 30.9 31.7 30.3  RDW 15.0 15.6* 15.7*  PLT 477* 500* 491*    Cardiac Enzymes  Recent Labs Lab 08/18/17 1647 08/19/17 1749 08/20/17 0010 08/20/17 0959  TROPONINI 0.25* 0.11* 0.13* 0.09*     Recent Labs Lab 08/19/17 1316 08/19/17 1624  TROPIPOC 0.09* 0.08     BNP  Recent Labs Lab 08/17/17 1831 08/19/17 1305  BNP  --  566.9*  PROBNP 541.0*  --      DDimer No results for input(s): DDIMER in the last 168 hours.   Radiology    No results found.  Cardiac Studies   2D echo 08/20/17 Study Conclusions  - Left ventricle: The cavity size was normal. Wall thickness was   increased in a pattern of mild LVH. Systolic function was   moderately reduced. The estimated ejection fraction was 40%.   Diffuse hypokinesis. The study is not technically sufficient to   allow evaluation of LV diastolic function. Doppler parameters are   consistent with high ventricular filling pressure. - Regional wall motion abnormality: Hypokinesis of the mid inferior   and mid inferolateral myocardium. - Aortic valve: Trileaflet; mildly thickened, mildly  calcified   leaflets. - Mitral valve: There was moderate regurgitation. - Tricuspid valve: There was mild-moderate regurgitation. - Pulmonary arteries: PA peak pressure: 47 mm Hg (S). - Inferior vena cava: The vessel was dilated. The respirophasic   diameter changes were normal (> 50%), consistent with elevated   central venous pressure of 8 mm Hg.   Patient Profile     71 y.o. 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 Was noted to have elevated troponin, acute systolic CHF, PVCs, prolonged QT in setting of hypomagnesemia/hypokalemia.  Assessment & Plan    1. Chest pain/dyspnea with elevated troponin (question demand vs NSTEMI)  2. Acute sytolic CHF, elevated PASP and CVP by echo  3. NSVT, brief atrial tach  4. Essential HTN  5. Hypokalemia/hypomagnesemia  6. CKD stage III  7. Abnormal CBC  8. 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 Troponin max 0.25-> 0.09, abnormal stress test suspicious for ischemia on the anterior wall, nsVT and atrial tachycardia on telemetry.  Cath yesterday showed clean coronary arteries, moderate LV dysfunction and elevated filling pressures along with pulmonary hypertension. She has a nonischemic cardiomyopathy. Given her LVEDP of 33 and her elevated wedge and PA pressures, I suspect she needs continued diuresis and optimization of heart  failure medications. She diuresed 3 L overnight in the last 48 hours, now down to 243 lbs - her new baseline. Crea is stable at 1.3 and improved since admission. K normal. We will dischargetoday with Lasix 40 mg PO BID, no diuretics prior to admission.  Added imdur 30 mg po daily yesterday.  We will arrange for an outpatient follow up with the next 10 days with BMP.   Ena Dawley, MD 08/24/2017

## 2017-08-24 NOTE — Progress Notes (Signed)
Patient is dishcarged home with home health. Discharge instructions were given to patient and family. Discharge Instructions not limited to medications and appointment was discussed. Patient and family stated they understood all instructions.

## 2017-08-24 NOTE — Progress Notes (Signed)
Patient with no complaints or concerns during 7pm - 7am shift. Slept during the night. Denies pain.   Slevin Gunby, RN

## 2017-08-24 NOTE — Discharge Summary (Signed)
Discharge Summary    Patient ID: Candace Long,  MRN: [id number], DOB/AGE: 1946/08/06 71 y.o.  Admit date: 08/19/2017 Discharge date: 08/24/2017  Primary Care Provider: Ria Bush Primary Cardiologist: Dr. Warren Lacy  Discharge Diagnoses    Principal Problem:   Acute systolic CHF (congestive heart failure) ([place]) Active Problems:   Hypertension   HLD (hyperlipidemia)   Severe obesity (BMI 35.0-39.9) with comorbidity (HCC)   Chronic kidney disease, stage III (moderate)   Thrombocytosis (HCC)   Normocytic anemia   Elevated troponin   NSVT (nonsustained ventricular tachycardia) (HCC)   Atrial tachycardia (HCC)   Hypomagnesemia   Hypokalemia   NICM (nonischemic cardiomyopathy) ([place])    Diagnostic Studies/Procedures    CARDIAC CATHETERIZATION     History obtained from patient's chart. Candace Long is a 71 year old moderately overweight African-American female admitted on 08/19/17 with heart failure. Her troponins were minimally elevated. Her 2-D echo revealed an ejection fraction in the 40% range and a Myoview stress test showed transient ischemic dilatation concerning for multivessel CAD. She has been diuresed. She does have a history of hypertension and hyperlipidemia as well as moderate renal insufficiency and her creatinines have improved. She presents now for right and left heart cath to define her anatomy and physiology.  IMPRESSION:Candace Long has clean coronary arteries, moderate LV dysfunction and elevated filling pressures along with pulmonary hypertension. She has a nonischemic cardiomyopathy. Given her LVEDP of 33 and her elevated wedge and PA pressures, I suspect she needs continued diuresis and optimization of heart failure medications. Her right common femoral puncture site was field with a MYNX closure device. She left the lab in stable condition. Dr. Meda Coffee was made aware of these results.  Quay Burow. MD, [facility] 08/22/2017 4:58 PM  2D Echo  08/20/17 Study Conclusions  - Left ventricle: The cavity size was normal. Wall thickness was   increased in a pattern of mild LVH. Systolic function was   moderately reduced. The estimated ejection fraction was 40%.   Diffuse hypokinesis. The study is not technically sufficient to   allow evaluation of LV diastolic function. Doppler parameters are   consistent with high ventricular filling pressure. - Regional wall motion abnormality: Hypokinesis of the mid inferior   and mid inferolateral myocardium. - Aortic valve: Trileaflet; mildly thickened, mildly calcified   leaflets. - Mitral valve: There was moderate regurgitation. - Tricuspid valve: There was mild-moderate regurgitation. - Pulmonary arteries: PA peak pressure: 47 mm Hg (S). - Inferior vena cava: The vessel was dilated. The respirophasic   diameter changes were normal (> 50%), consistent with elevated   central venous pressure of 8 mm Hg.    _____________     History of Present Illness     Candace Long is a 72 y.o. 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 She previously did not have a cardiologist - had low risk nuc in 2014 with EF 55%.   She sees oncology for abnormal CBC with leukopenia, anemia, thrombocytosis noted. She was pending outpatient possible BMB and f/u with hematology, although had previously declined this. Prompting this admission, she had noted shortness of breath for 2-3 weeks, wheezing, fatigue, feet swelling (bilateral), and possible chest discomfort. Given her memory issues, she was somewhat of a vague historian. She saw her PCP who ordered CXR showing  possible pulmonary vascular congestion. OP Labs showed elevated troponin so she was asked to come to the ER. Of  note, son Juanda Crumble has POA.   Hospital Course      1. Acute sytolic CHF, elevated PASP and CVP by echo - 2D echo showed mild LVH, EF 40%, high ventricular filling pressure, hypokinesis of mid-inferior and mid inferolateral myocardium, moderate MR, mild-mod TR, PASP 65mmHg. EF was 29% by nuclear stress test. R/LHC showed LVEDP of 33 and elevated wedge and PA pressures, prompting initiation of IV Lasix. Her weight was inconsistent during stay and I/O's appeared incomplete, but today she is feeling much better and Dr. Meda Coffee feels she has clinically improved. DC weight 243lb. Dr. Meda Coffee recommends Lasix 40mg  PO BID as OP. ACEI or spironolactone were not added due to elevated Cr and recent cath, but could consider as OP. She also had significant wheezing this admission which has improved. If this continues to improve as OP would consider initiation of selective beta blocker. Dr. Meda Coffee recommended to continue amlodipine as she felt the cardiomyopathy was most likely due to poorly controlled HTN. She was also started on Imdur/hydralazine this admission.  2. Elevated troponin - peak 0.25. She underwent nuclear stress test which was high risk concerning for diffuse disease. Cardiac cath 08/22/17 showed clean coronary arteries but other findings as above.   3. NSVT/brief atrial tach on telemetry - occurred in the setting of electrolyte disturbance with hypokalemia and hypomagnesemia, repleted.  4. Essential HTN - followed during admission. Discharge BP 147/78.  5. Hypokalemia/hypomagnesemia - etiology of persistent hypokalemia not totally clear; this was present even prior to diuretic therapy. She required both magnesium and potassium supplementation at discharge. Will plan close lab f/u on Monday to trend levels given continued baseline diuretic therapy.  6. CKD stage III - Cr followed closely this admission, was 1.31 by last check.  7. Abnormal CBC - leukopenia, anemia, thrombocytosis noted. Was  pending outpatient possible BMB and f/u with hematology.   8. Hyperlipidemia - LDL is 67. Statin was continued.  9. IDDM - advised to resume metformin tomorrow given cath on 08/22/17.  Dr. Meda Coffee has seen and examined the patient today and feels she is stable for discharge. Will get PT evaluation to assess for any home needs prior to discharge. _____________  Discharge Vitals Blood pressure (!) 147/78, pulse 94, temperature (!) 97.4 F (36.3 C), temperature source Oral, resp. rate 20, height 5\' 9"  (1.753 m), weight 243 lb 12.8 oz (110.6 kg), SpO2 97 %.  Filed Weights   08/22/17 0548 08/23/17 0452 08/24/17 0541  Weight: 245 lb (111.1 kg) 238 lb 4.8 oz (108.1 kg) 243 lb 12.8 oz (110.6 kg)    Labs & Radiologic Studies    CBC  Recent Labs  08/22/17 0412 08/23/17 0539  WBC 2.9* 2.9*  HGB 10.3* 10.1*  HCT 32.5* 33.3*  MCV 92.1 93.5  PLT 500* 341*   Basic Metabolic Panel  Recent Labs  08/22/17 0412 08/23/17 0539 08/24/17 0450  NA 137 140  --   K 3.4* 4.1  --   CL 102 104  --   CO2 26 28  --   GLUCOSE 116* 143*  --   BUN 19 15  --   CREATININE 1.34* 1.31*  --   CALCIUM 8.7* 9.3  --   MG 1.8 1.9 2.0   Dg Chest 2 View  Result Date: 08/19/2017 CLINICAL DATA:  Shortness of Breath EXAM: CHEST  2 VIEW COMPARISON:  August 17, 2017 and November 12, 2013 FINDINGS: There is there is mild bibasilar atelectasis, stable. There is no appreciable edema or consolidation. Heart is slightly enlarged with pulmonary vascularity within normal limits. No evident adenopathy. There is degenerative change in the thoracic spine. 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 IMPRESSION: Stable bibasilar atelectasis. No edema or consolidation. Stable cardiac prominence. Electronically Signed   By: Lowella Grip III M.D.   On: 08/19/2017 13:20   Dg Chest 2 View  Result Date: 08/18/2017 CLINICAL DATA:  Progressive shortness of breath EXAM: CHEST  2 VIEW COMPARISON:  Chest x-ray of  11/12/2013 FINDINGS: The heart is mildly enlarged. There may be a tiny left pleural effusion with mild basilar atelectasis. Also there is a question of mild pulmonary vascular congestion. Right central venous line remains. There are degenerative changes throughout the thoracic spine. IMPRESSION: Possible mild pulmonary vascular congestion. Mild bibasilar atelectasis. Electronically Signed   By: Ivar Drape M.D.   On: 08/18/2017 08:22   Nm Myocar Multi W/spect W/wall Motion / Ef  Result Date: 08/21/2017 CLINICAL DATA:  71 year old female with elevated troponins, possible acute coronary syndrome EXAM: MYOCARDIAL IMAGING WITH SPECT (REST AND PHARMACOLOGIC-STRESS) GATED LEFT VENTRICULAR WALL MOTION STUDY LEFT VENTRICULAR EJECTION FRACTION TECHNIQUE: Standard myocardial SPECT imaging was performed after resting intravenous injection of 10 mCi Tc-61m tetrofosmin. Subsequently, intravenous infusion of Lexiscan was performed under the supervision of the Cardiology staff. At peak effect of the drug, 30 mCi Tc-31m tetrofosmin was injected intravenously and standard myocardial SPECT imaging was performed. Quantitative gated imaging was also performed to evaluate left ventricular wall motion, and estimate left ventricular ejection fraction. COMPARISON:  Chest x-ray 08/19/2017 prior nuclear medicine cardiac stress test 01/08/2013 FINDINGS: Perfusion: Diffuse dilatation of the left ventricle with relatively decreased radiotracer uptake throughout the sub endocardium on stress imaging consistent with transient ischemic dilatation. The TID ratio was 1.24 which is positive. No significant fixed defect. Wall Motion: Global hypokinesis. Left Ventricular Ejection Fraction: 94% End diastolic volume 854 ml End systolic volume 627 ml IMPRESSION: 1. Positive for transient ischemic dilatation concerning for diffuse reversible sub endocardial ischemia and severe underlying coronary artery disease. 2. Dilated left ventricle with diffuse  hypokinesis. 3. Left ventricular ejection fraction 29% 4. Non invasive risk stratification*: High *2012 Appropriate Use Criteria for Coronary Revascularization Focused Update: J Am Coll Cardiol. 0350;09(3):818-299. http://content.airportbarriers.com.aspx?articleid=[email or web address] These results will be called to the ordering clinician or representative by the Radiologist Assistant, and communication documented in the PACS or zVision Dashboard. Electronically Signed   By: Jacqulynn Cadet M.D.   On: 08/21/2017 13:39   Disposition   Pt is being discharged home today in good condition.  Follow-up Plans & Appointments    Follow-up Information    Ria Bush, MD Follow up on 08/30/2017.   Specialty:  Family Medicine Why:  follow-up @ 12pm, arrive at 11:45am Contact information: [place] [address] [phone]        Barrett, Evelene Croon, PA-C Follow up.   Specialties:  Cardiology, Radiology Why:  [place] office by [facility] - 09/01/17 at [place] at 2:45pm. [name] is one of the PAs that works closely with Dr. Percival Spanish, your cardiologist. Contact information: [address] [place] [address] [place] Follow up on 08/26/2017.   Specialty:  Cardiology Why:  [place] office by [facility] - please come to the office on Monday  08/29/17 between 8am-5pm to have your potassium and magnesium level rechecked.  Contact information: [address] [place] [phone]         Discharge Instructions    Diet - low sodium heart healthy    Complete by:  As directed    Increase activity slowly    Complete by:  As directed    You may restart Metformin TOMORROW morning (08/25/17).      Discharge Medications   Allergies as of 08/24/2017      Reactions   Hydrochlorothiazide Other (See Comments)   Hypokalemia, hyponatremia        Medication List    STOP taking these medications   lisinopril 40 MG tablet Commonly known as:  PRINIVIL,ZESTRIL     TAKE these medications   albuterol 108 (90 Base) MCG/ACT inhaler Commonly known as:  VENTOLIN HFA Inhale 2 puffs into the lungs every 6 (six) hours as needed for wheezing or shortness of breath.   amLODipine 10 MG tablet Commonly known as:  NORVASC TAKE 1 TABLET (10 MG TOTAL) BY MOUTH DAILY.   aspirin EC 81 MG tablet Take 81 mg by mouth daily.   atorvastatin 40 MG tablet Commonly known as:  LIPITOR TAKE 1 TABLET (40 MG TOTAL) BY MOUTH DAILY.   B-D ULTRAFINE III SHORT PEN 31G X 8 MM Misc Generic drug:  Insulin Pen Needle USE TO INJECT LANTUS ONCE DAILY.   furosemide 40 MG tablet Commonly known as:  LASIX Take 1 tablet (40 mg total) by mouth 2 (two) times daily.   hydrALAZINE 25 MG tablet Commonly known as:  APRESOLINE Take 1 tablet (25 mg total) by mouth every 8 (eight) hours.   isosorbide mononitrate 30 MG 24 hr tablet Commonly known as:  IMDUR Take 1 tablet (30 mg total) by mouth daily.   LANTUS SOLOSTAR 100 UNIT/ML Solostar Pen Generic drug:  Insulin Glargine INJECT 30 UNITS INTO THE SKIN DAILY. What changed:  See the new instructions.   magnesium oxide 400 (241.3 Mg) MG tablet Commonly known as:  MAG-OX Take 1 tablet (400 mg total) by mouth 2 (two) times daily.   metFORMIN 1000 MG tablet Commonly known as:  GLUCOPHAGE TAKE 1 TABLET (1,000 MG TOTAL) BY MOUTH 2 (TWO) TIMES DAILY WITH A MEAL.   potassium chloride SA 20 MEQ tablet Commonly known as:  KLOR-CON M20 Take 2 tablets (40 mEq total) by mouth 2 (two) times daily. What changed:  how much to take   vitamin B-12 1000 MCG tablet Commonly known as:  CYANOCOBALAMIN Take 1 tablet (1,000 mcg total) by mouth daily.   Vitamin D 1000 units capsule Take 1 capsule (1,000 Units total) by mouth daily.            Discharge Care Instructions        Start     Ordered   08/24/17 0000   furosemide (LASIX) 40 MG tablet  2 times daily    Question:  Supervising Provider  Answer:  Jolaine Artist   08/24/17 0930   08/24/17 0000  isosorbide mononitrate (IMDUR) 30 MG 24 hr tablet  Daily    Question:  Supervising Provider  Answer:  Glori Bickers R   08/24/17 0930   08/24/17 0000  potassium chloride SA (KLOR-CON M20) 20 MEQ tablet  2 times daily    Question:  Supervising Provider  Answer:  Glori Bickers R   08/24/17 0930   08/24/17 0000  magnesium oxide (MAG-OX) 400 (241.3  Mg) MG tablet  2 times daily    Question:  Supervising Provider  Answer:  Jolaine Artist   08/24/17 0930   08/24/17 0000  Increase activity slowly     08/24/17 0930   08/24/17 0000  Diet - low sodium heart healthy     08/24/17 0930   08/24/17 0000  hydrALAZINE (APRESOLINE) 25 MG tablet  Every 8 hours    Question:  Supervising Provider  Answer:  Jolaine Artist   08/24/17 0933       Allergies:  Allergies  Allergen Reactions  . Hydrochlorothiazide Other (See Comments)    Hypokalemia, hyponatremia     Outstanding Labs/Studies   BMET, mg  Duration of Discharge Encounter   Greater than 30 minutes including physician time.  Signed, Charlie Pitter PA-C 08/24/2017, 9:43 AM

## 2017-08-24 NOTE — Telephone Encounter (Signed)
I s/w the pts son, Juanda Crumble, who states that the pt is still in the hospital. Will forward message to [facility] triage as the pt will be following up there.

## 2017-08-24 NOTE — Consult Note (Signed)
   [facility] CM Inpatient Consult   08/24/2017  Candace Long 05/23/1946 [id number]  Patient admitted for chest pain and found to have Acute systolic HF.  Patient in the Medicare ACO.  Met with the patient at bedside. Patient denies any difficulties at home with transportation, medications, or needs.  She states, "My son takes care of all of that."  Patient did accept a brochure, 24 hour nurse advise line magnet and encouraged to call.  Patient states she will accept EMMI general call follow up.  For questions, please contact:  Natividad Brood, RN BSN [facility] Liaison  [phone] business mobile phone Toll free office [phone]

## 2017-08-24 NOTE — Progress Notes (Signed)
CM received a call from patient's son requesting DCP; patient could benefit from [facility] services; [place] choice offered, son chose [place]; Butch Penny with [facility] called for arrangements; Aneta Mins [phone]  [facility] referral made also for CHF

## 2017-08-24 NOTE — Telephone Encounter (Signed)
-----   Message from Charlie Pitter, [place] sent at 08/24/2017  9:42 AM EDT ----- Regarding: Needs TOC call Going home today. thx!

## 2017-08-25 ENCOUNTER — Telehealth: Payer: Self-pay

## 2017-08-25 NOTE — Telephone Encounter (Signed)
[id number] 

## 2017-08-25 NOTE — Telephone Encounter (Signed)
Spoke with patient's son, Juanda Crumble.   Transition Care Management Follow-up Telephone Call   Date discharged? 08/24/17   How have you been since you were released from the hospital? "She's doing good"   Do you understand why you were in the hospital? yes   Do you understand the discharge instructions? yes   Where were you discharged to? Home. Lives with son.    Items Reviewed:  Medications reviewed: no, so did not have meds/list at time of call.   Allergies reviewed: yes  Dietary changes reviewed: no  Referrals reviewed: yes, [facility] to start 09/15 (weather permitting) for PT   Functional Questionnaire:   Activities of Daily Living (ADLs):   She states they are independent in the following: ambulation, bathing and hygiene, feeding, continence, grooming, toileting and dressing States they require assistance with the following: None.   Any transportation issues/concerns?: no   Any patient concerns? No. Son plans to transfer patient care after this appointment to different [place] location d/t relocating.    Confirmed importance and date/time of follow-up visits scheduled yes  Provider Appointment booked with PCP 08/30/17 @ 12p  Confirmed with patient if condition begins to worsen call PCP or go to the ER.  Patient was given the office number and encouraged to call back with question or concerns.  : yes

## 2017-08-26 NOTE — Telephone Encounter (Signed)
Left message to call back - phone was to a [facility] [employer]

## 2017-08-28 DIAGNOSIS — Z6835 Body mass index (BMI) 35.0-35.9, adult: Secondary | ICD-10-CM | POA: Diagnosis not present

## 2017-08-28 DIAGNOSIS — E876 Hypokalemia: Secondary | ICD-10-CM | POA: Diagnosis not present

## 2017-08-28 DIAGNOSIS — E785 Hyperlipidemia, unspecified: Secondary | ICD-10-CM | POA: Diagnosis not present

## 2017-08-28 DIAGNOSIS — I130 Hypertensive heart and chronic kidney disease with heart failure and stage 1 through stage 4 chronic kidney disease, or unspecified chronic kidney disease: Secondary | ICD-10-CM | POA: Diagnosis not present

## 2017-08-28 DIAGNOSIS — E1122 Type 2 diabetes mellitus with diabetic chronic kidney disease: Secondary | ICD-10-CM | POA: Diagnosis not present

## 2017-08-28 DIAGNOSIS — I428 Other cardiomyopathies: Secondary | ICD-10-CM | POA: Diagnosis not present

## 2017-08-28 DIAGNOSIS — I69811 Memory deficit following other cerebrovascular disease: Secondary | ICD-10-CM | POA: Diagnosis not present

## 2017-08-28 DIAGNOSIS — E669 Obesity, unspecified: Secondary | ICD-10-CM | POA: Diagnosis not present

## 2017-08-28 DIAGNOSIS — N183 Chronic kidney disease, stage 3 (moderate): Secondary | ICD-10-CM | POA: Diagnosis not present

## 2017-08-28 DIAGNOSIS — I5021 Acute systolic (congestive) heart failure: Secondary | ICD-10-CM | POA: Diagnosis not present

## 2017-08-28 DIAGNOSIS — Z982 Presence of cerebrospinal fluid drainage device: Secondary | ICD-10-CM | POA: Diagnosis not present

## 2017-08-29 ENCOUNTER — Other Ambulatory Visit: Payer: Self-pay

## 2017-08-29 ENCOUNTER — Other Ambulatory Visit: Payer: Self-pay | Admitting: Family Medicine

## 2017-08-29 NOTE — Telephone Encounter (Signed)
TOC call to patient no answer.LMTC. °

## 2017-08-29 NOTE — Patient Outreach (Signed)
[place] [facility]) Care Management  08/29/2017  Candace Long 1946/07/09 [id number]  EMMI: Congestive heart failure Referral date: 08/29/18 Referral source: EMMI heart failure red alert Referral reason: Weighed themselves: NO Day # 2  Telephone call to patient regarding EMMI heart failure red alert. Unable to reach patient. HIPAA compliant voice message left with call back phone number.    PLAN: RNCM will attempt 2nd telephone call to patient within 3 days.   Quinn Plowman RN,BSN,CCM [facility] Telephonic  [phone]

## 2017-08-30 ENCOUNTER — Ambulatory Visit: Payer: Medicare Other | Admitting: Family Medicine

## 2017-08-30 ENCOUNTER — Ambulatory Visit: Payer: Self-pay

## 2017-08-30 DIAGNOSIS — I69811 Memory deficit following other cerebrovascular disease: Secondary | ICD-10-CM | POA: Diagnosis not present

## 2017-08-30 DIAGNOSIS — I5021 Acute systolic (congestive) heart failure: Secondary | ICD-10-CM | POA: Diagnosis not present

## 2017-08-30 DIAGNOSIS — E1122 Type 2 diabetes mellitus with diabetic chronic kidney disease: Secondary | ICD-10-CM | POA: Diagnosis not present

## 2017-08-30 DIAGNOSIS — I428 Other cardiomyopathies: Secondary | ICD-10-CM | POA: Diagnosis not present

## 2017-08-30 DIAGNOSIS — N183 Chronic kidney disease, stage 3 (moderate): Secondary | ICD-10-CM | POA: Diagnosis not present

## 2017-08-30 DIAGNOSIS — I130 Hypertensive heart and chronic kidney disease with heart failure and stage 1 through stage 4 chronic kidney disease, or unspecified chronic kidney disease: Secondary | ICD-10-CM | POA: Diagnosis not present

## 2017-08-30 NOTE — Telephone Encounter (Signed)
3 TOC outreach attempts have been made on 3 separate days.  Patient has appointment on 09/01/17 This encounter will be closed per policy

## 2017-08-31 ENCOUNTER — Telehealth: Payer: Self-pay | Admitting: Cardiology

## 2017-08-31 ENCOUNTER — Telehealth: Payer: Self-pay

## 2017-08-31 ENCOUNTER — Other Ambulatory Visit: Payer: Self-pay

## 2017-08-31 NOTE — Telephone Encounter (Signed)
Agree. Thanks

## 2017-08-31 NOTE — Telephone Encounter (Signed)
Attempted to reach pt to schedule AWV+CPE. No answer. No vm

## 2017-08-31 NOTE — Telephone Encounter (Signed)
Spoke with [facility] informed that [facility] will not sign orders for speech therapy, she states that she will call PCP for both orders, speech and PT this is to build up her tolerance and endurance

## 2017-08-31 NOTE — Telephone Encounter (Signed)
Left message on vm relaying message per Dr. Darnell Level.

## 2017-08-31 NOTE — Telephone Encounter (Signed)
New message   Physical therapy twice per week for the next 3 weeks - need orders sent to [facility] as well for cognition level

## 2017-08-31 NOTE — Telephone Encounter (Signed)
[facility] with [facility] left v/m requesting verbal orders for [facility] PT 2 x a week for  3- 4 weeks and HH speech therapy eval to ck cognitive level.

## 2017-08-31 NOTE — Patient Outreach (Signed)
[place] [facility]) Care Management  08/31/2017  Candace Long 1946/06/08 [id number]   EMMI: Congestive heart failure Referral date: 08/29/18 Referral source: EMMI heart failure red alert Referral reason: Weighed themselves: NO Day # 2 Attempt #2  Telephone call to patient regarding EMMI heart failure red alert. Unable to reach patient. HIPAA compliant voice message left with call back phone number.    PLAN: RNCM will attempt 3rd telephone call to patient within 3 days.   Quinn Plowman RN,BSN,CCM [facility] Telephonic  [phone]

## 2017-09-01 ENCOUNTER — Ambulatory Visit: Payer: Medicare Other | Admitting: Physician Assistant

## 2017-09-01 ENCOUNTER — Other Ambulatory Visit: Payer: Self-pay

## 2017-09-01 NOTE — Progress Notes (Deleted)
Cardiology Office Note   Date:  09/01/2017   ID:  Candace Long, DOB [birth year], MRN [id number]  PCP:  Candace Bush, MD  Cardiologist:  Dr. Percival Long, 08/19/2017 in hospital  Candace Long, Candace Long, [place]   No chief complaint on file.   History of Present Illness: Candace Long is a 71 y.o. 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 Beta blocker not initiated because of wheezing, add as outpatient, cardiomyopathy most likely due to poorly controlled HTN. Started on Imdur/hydralazine and continued on amlodipine. Weight at discharge 243 pounds   Candace Long presents for ***   Past Medical History:  Diagnosis Date  . Chronic kidney disease, stage III (moderate) 05/24/2014  . HLD (hyperlipidemia)   . Hypertension   . Obesity   . S/P cerebral aneurysm repair 1993   some residual memory impairment  . Uncontrolled type 2 diabetes mellitus with nephropathy [facility])    DSME [facility] 06/2014, did not keep f/u classes so discharged    Past Surgical History:  Procedure Laterality Date  . CARDIOVASCULAR STRESS TEST  12/2012   WNL EF 55% (Candace Long)  . COLONOSCOPY  07/2014   1 tubular adenoma, severe diverticulosis, rpt 5 yrs Candace Long)  . CRANIOTOMY  1993   cerebral aneurysm repair  . RIGHT/LEFT HEART CATH AND CORONARY ANGIOGRAPHY N/A 08/22/2017   Procedure: RIGHT/LEFT HEART CATH AND CORONARY ANGIOGRAPHY;  Surgeon: Candace Harp, MD;  Location: [place] [facility];  Service: Cardiovascular;  Laterality: N/A;  . VENTRICULOPERITONEAL SHUNT  1993    Current Outpatient Prescriptions  Medication Sig Dispense Refill  . albuterol (VENTOLIN HFA) 108 (90 Base) MCG/ACT inhaler Inhale 2 puffs into the lungs every 6 (six) hours as needed for  wheezing or shortness of breath. 1 Inhaler 3  . amLODipine (NORVASC) 10 MG tablet TAKE 1 TABLET (10 MG TOTAL) BY MOUTH DAILY. 90 tablet 1  . aspirin EC 81 MG tablet Take 81 mg by mouth daily.     Marland Kitchen atorvastatin (LIPITOR) 40 MG tablet TAKE 1 TABLET (40 MG TOTAL) BY MOUTH DAILY. 90 tablet 2  . B-D ULTRAFINE III SHORT PEN 31G X 8 MM MISC USE TO INJECT LANTUS ONCE DAILY. 100 each 3  . Cholecalciferol (VITAMIN D) 1000 UNITS capsule Take 1 capsule (1,000 Units total) by mouth daily.    . furosemide (LASIX) 40 MG tablet Take 1 tablet (40 mg total) by mouth 2 (two) times daily. 60 tablet 1  . hydrALAZINE (APRESOLINE) 25 MG tablet Take 1 tablet (25 mg total) by mouth every 8 (eight) hours. 90 tablet 1  . isosorbide mononitrate (IMDUR) 30 MG 24 hr tablet Take 1 tablet (30 mg total) by mouth daily. 30 tablet 1  . LANTUS SOLOSTAR 100 UNIT/ML Solostar Pen INJECT 30 UNITS INTO THE SKIN DAILY. (Patient taking differently: Inject 30 units into the skin at bedtime) 15 pen 3  . magnesium oxide (MAG-OX) 400 (241.3 Mg) MG tablet Take 1 tablet (400 mg total) by mouth 2 (two) times daily. 60 tablet 1  . metFORMIN (GLUCOPHAGE) 1000 MG tablet TAKE 1 TABLET BY MOUTH 2 TIMES DAILY WITH A MEAL. 60 tablet 5  . potassium chloride SA (KLOR-CON M20) 20 MEQ tablet Take 2 tablets (40 mEq total) by mouth 2 (two) times daily. Bell  tablet 1  . vitamin B-12 (CYANOCOBALAMIN) 1000 MCG tablet Take 1 tablet (1,000 mcg total) by mouth daily.     No current facility-administered medications for this visit.     Allergies:   Hydrochlorothiazide    Social History:  The patient  reports that she quit smoking about 8 years ago. Her smoking use included Cigarettes. She smoked 1.00 pack per day. She quit smokeless tobacco use about 6 years ago. Her smokeless tobacco use included Snuff. She reports that she does not drink alcohol or use drugs.   Family History:  The patient's family history includes Diabetes in her mother and sister; Heart  failure in her sister; Hypertension in her mother.    ROS:  Please see the history of present illness. All other systems are reviewed and negative.    PHYSICAL EXAM: VS:  There were no vitals taken for this visit. , BMI There is no height or weight on file to calculate BMI. GEN: Well nourished, well developed, female in no acute distress  HEENT: normal for age  Neck: no JVD, no carotid bruit, no masses Cardiac: RRR; no murmur, no rubs, or gallops Respiratory:  clear to auscultation bilaterally, normal work of breathing GI: soft, nontender, nondistended, + BS MS: no deformity or atrophy; no edema; distal pulses are 2+ in all 4 extremities   Skin: warm and dry, no rash Neuro:  Strength and sensation are intact Psych: euthymic mood, full affect   EKG:  EKG {ACTION; IS/IS YBO:[id number]} ordered today. The ekg ordered today demonstrates ***  CARDIAC CATH: 08/22/2017 IMPRESSION:Ms. Candace Long has clean coronary arteries, moderate LV dysfunction and elevated filling pressures along with pulmonary hypertension. She has a nonischemic cardiomyopathy. Given her LVEDP of 33 and her elevated wedge and PA pressures, I suspect she needs continued diuresis and optimization of heart failure medications.  2D Echo 08/20/17 Study Conclusions  - Left ventricle: The cavity size was normal. Wall thickness was increased in a pattern of mild LVH. Systolic function was moderately reduced. The estimated ejection fraction was 40%. Diffuse hypokinesis. The study is not technically sufficient to allow evaluation of LV diastolic function. Doppler parameters are consistent with high ventricular filling pressure. - Regional wall motion abnormality: Hypokinesis of the mid inferior and mid inferolateral myocardium. - Aortic valve: Trileaflet; mildly thickened, mildly calcified leaflets. - Mitral valve: There was moderate regurgitation. - Tricuspid valve: There was mild-moderate regurgitation. -  Pulmonary arteries: PA peak pressure: 47 mm Hg (S). - Inferior vena cava: The vessel was dilated. The respirophasic diameter changes were normal (>50%), consistent with elevated central venous pressure of 8 mm Hg.   Recent Labs: 05/12/2017: ALT 19 08/17/2017: Pro B Natriuretic peptide (BNP) 541.0; TSH 2.25 08/19/2017: B Natriuretic Peptide 566.9 08/23/2017: BUN 15; Creatinine, Ser 1.31; Hemoglobin 10.1; Platelets 491; Potassium 4.1; Sodium 140 08/24/2017: Magnesium 2.0    Lipid Panel    Component Value Date/Time   CHOL 118 08/20/2017 0229   TRIG 74 08/20/2017 0229   TRIG 157 06/29/2013   TRIG 157 06/29/2013   HDL 36 (L) 08/20/2017 0229   CHOLHDL 3.3 08/20/2017 0229   VLDL 15 08/20/2017 0229   LDLCALC 67 08/20/2017 0229   LDLCALC 62 06/29/2013   LDLCALC 62 06/29/2013     Wt Readings from Last 3 Encounters:  08/24/17 243 lb 12.8 oz (110.6 kg)  08/17/17 245 lb (111.1 kg)  05/12/17 240 lb 5 oz (109 kg)     Other studies Reviewed: Additional studies/ records that were reviewed today  include: ***.  ASSESSMENT AND PLAN:  1.  ***   Current medicines are reviewed at length with the patient today.  The patient {ACTIONS; HAS/DOES NOT HAVE:19233} concerns regarding medicines.  The following changes have been made:  {PLAN; NO CHANGE:13088:s}  Labs/ tests ordered today include: *** No orders of the defined types were placed in this encounter.    Disposition:   FU with Dr. Percival Long  Signed, Lenoard Aden  09/01/2017 10:17 AM    [place] Phone: [phone]; Fax: [phone]  This note was written with the assistance of speech recognition software. Please excuse any transcriptional errors.

## 2017-09-01 NOTE — Patient Outreach (Signed)
[facility]) Care Management  09/01/2017  Candace Long 1946-05-13 [id number]  EMMI: Congestive heart failure Referral date: 08/29/18 Referral source: EMMI heart failure red alert Referral reason: Weighed themselves: NO Day # 2 Attempt #3  Telephone call to patient regarding EMMI heart failure red alert. Contact answering phone identified himself as patients son.  HIPAA compliant message left with call back phone number.    PLAN: RNCM will send patient outreach letter to attempt contact.   Quinn Plowman RN,BSN,CCM [facility] Telephonic  [phone]

## 2017-09-02 ENCOUNTER — Other Ambulatory Visit: Payer: Self-pay

## 2017-09-02 NOTE — Patient Outreach (Signed)
[facility]) Care Management  09/02/2017  Candace Long February 12, 1946 [id number]   EMMI: Congestive heart failure Referral date: 08/29/18 Referral source: EMMI heart failure red alert Referral reason: Weighed themselves: NO Day # 2 Attempt #3  Third telephone call to patient regarding EMMI heart failure red alert. Unable to reach patient. HIPAA compliant voice message left with call back phone number.   PLAN; RNCM will send patient outreach letter to attempt contact.   Quinn Plowman RN,BSN,CCM [facility] Telephonic  [phone]

## 2017-09-03 DIAGNOSIS — I69811 Memory deficit following other cerebrovascular disease: Secondary | ICD-10-CM | POA: Diagnosis not present

## 2017-09-03 DIAGNOSIS — I428 Other cardiomyopathies: Secondary | ICD-10-CM | POA: Diagnosis not present

## 2017-09-03 DIAGNOSIS — E1122 Type 2 diabetes mellitus with diabetic chronic kidney disease: Secondary | ICD-10-CM | POA: Diagnosis not present

## 2017-09-03 DIAGNOSIS — I130 Hypertensive heart and chronic kidney disease with heart failure and stage 1 through stage 4 chronic kidney disease, or unspecified chronic kidney disease: Secondary | ICD-10-CM | POA: Diagnosis not present

## 2017-09-03 DIAGNOSIS — N183 Chronic kidney disease, stage 3 (moderate): Secondary | ICD-10-CM | POA: Diagnosis not present

## 2017-09-03 DIAGNOSIS — I5021 Acute systolic (congestive) heart failure: Secondary | ICD-10-CM | POA: Diagnosis not present

## 2017-09-05 DIAGNOSIS — I130 Hypertensive heart and chronic kidney disease with heart failure and stage 1 through stage 4 chronic kidney disease, or unspecified chronic kidney disease: Secondary | ICD-10-CM | POA: Diagnosis not present

## 2017-09-05 DIAGNOSIS — I69811 Memory deficit following other cerebrovascular disease: Secondary | ICD-10-CM | POA: Diagnosis not present

## 2017-09-05 DIAGNOSIS — E1122 Type 2 diabetes mellitus with diabetic chronic kidney disease: Secondary | ICD-10-CM | POA: Diagnosis not present

## 2017-09-05 DIAGNOSIS — I5021 Acute systolic (congestive) heart failure: Secondary | ICD-10-CM | POA: Diagnosis not present

## 2017-09-05 DIAGNOSIS — N183 Chronic kidney disease, stage 3 (moderate): Secondary | ICD-10-CM | POA: Diagnosis not present

## 2017-09-05 DIAGNOSIS — I428 Other cardiomyopathies: Secondary | ICD-10-CM | POA: Diagnosis not present

## 2017-09-06 DIAGNOSIS — I69811 Memory deficit following other cerebrovascular disease: Secondary | ICD-10-CM | POA: Diagnosis not present

## 2017-09-06 DIAGNOSIS — I428 Other cardiomyopathies: Secondary | ICD-10-CM | POA: Diagnosis not present

## 2017-09-06 DIAGNOSIS — E1122 Type 2 diabetes mellitus with diabetic chronic kidney disease: Secondary | ICD-10-CM | POA: Diagnosis not present

## 2017-09-06 DIAGNOSIS — N183 Chronic kidney disease, stage 3 (moderate): Secondary | ICD-10-CM | POA: Diagnosis not present

## 2017-09-06 DIAGNOSIS — I5021 Acute systolic (congestive) heart failure: Secondary | ICD-10-CM | POA: Diagnosis not present

## 2017-09-06 DIAGNOSIS — I130 Hypertensive heart and chronic kidney disease with heart failure and stage 1 through stage 4 chronic kidney disease, or unspecified chronic kidney disease: Secondary | ICD-10-CM | POA: Diagnosis not present

## 2017-09-07 ENCOUNTER — Telehealth: Payer: Self-pay

## 2017-09-07 DIAGNOSIS — G3184 Mild cognitive impairment, so stated: Secondary | ICD-10-CM

## 2017-09-07 DIAGNOSIS — I5021 Acute systolic (congestive) heart failure: Secondary | ICD-10-CM | POA: Diagnosis not present

## 2017-09-07 DIAGNOSIS — I130 Hypertensive heart and chronic kidney disease with heart failure and stage 1 through stage 4 chronic kidney disease, or unspecified chronic kidney disease: Secondary | ICD-10-CM | POA: Diagnosis not present

## 2017-09-07 DIAGNOSIS — I428 Other cardiomyopathies: Secondary | ICD-10-CM | POA: Diagnosis not present

## 2017-09-07 DIAGNOSIS — N183 Chronic kidney disease, stage 3 (moderate): Secondary | ICD-10-CM | POA: Diagnosis not present

## 2017-09-07 DIAGNOSIS — E1122 Type 2 diabetes mellitus with diabetic chronic kidney disease: Secondary | ICD-10-CM | POA: Diagnosis not present

## 2017-09-07 DIAGNOSIS — I69811 Memory deficit following other cerebrovascular disease: Secondary | ICD-10-CM | POA: Diagnosis not present

## 2017-09-07 NOTE — Telephone Encounter (Signed)
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 pts son said pt has not see neurologist in long time. Candace Long requesting a referral for neurologist. Candace Long will not be working with pt at this time;pt swallowing is doing great and pts family educated how can help pt.Please advise.

## 2017-09-07 NOTE — Telephone Encounter (Signed)
Noted. Neurology referral placed.

## 2017-09-08 DIAGNOSIS — E1122 Type 2 diabetes mellitus with diabetic chronic kidney disease: Secondary | ICD-10-CM | POA: Diagnosis not present

## 2017-09-08 DIAGNOSIS — I69811 Memory deficit following other cerebrovascular disease: Secondary | ICD-10-CM | POA: Diagnosis not present

## 2017-09-08 DIAGNOSIS — I5021 Acute systolic (congestive) heart failure: Secondary | ICD-10-CM | POA: Diagnosis not present

## 2017-09-08 DIAGNOSIS — N183 Chronic kidney disease, stage 3 (moderate): Secondary | ICD-10-CM | POA: Diagnosis not present

## 2017-09-08 DIAGNOSIS — I130 Hypertensive heart and chronic kidney disease with heart failure and stage 1 through stage 4 chronic kidney disease, or unspecified chronic kidney disease: Secondary | ICD-10-CM | POA: Diagnosis not present

## 2017-09-08 DIAGNOSIS — I428 Other cardiomyopathies: Secondary | ICD-10-CM | POA: Diagnosis not present

## 2017-09-09 DIAGNOSIS — I130 Hypertensive heart and chronic kidney disease with heart failure and stage 1 through stage 4 chronic kidney disease, or unspecified chronic kidney disease: Secondary | ICD-10-CM | POA: Diagnosis not present

## 2017-09-09 DIAGNOSIS — I428 Other cardiomyopathies: Secondary | ICD-10-CM | POA: Diagnosis not present

## 2017-09-09 DIAGNOSIS — N183 Chronic kidney disease, stage 3 (moderate): Secondary | ICD-10-CM | POA: Diagnosis not present

## 2017-09-09 DIAGNOSIS — E1122 Type 2 diabetes mellitus with diabetic chronic kidney disease: Secondary | ICD-10-CM | POA: Diagnosis not present

## 2017-09-09 DIAGNOSIS — I69811 Memory deficit following other cerebrovascular disease: Secondary | ICD-10-CM | POA: Diagnosis not present

## 2017-09-09 DIAGNOSIS — I5021 Acute systolic (congestive) heart failure: Secondary | ICD-10-CM | POA: Diagnosis not present

## 2017-09-09 NOTE — Telephone Encounter (Signed)
LVM for son Margette Fast that Neurology Referral was put in for his mother and someone from [facility] would be calling him to schedule this  Appointment.

## 2017-09-12 DIAGNOSIS — E1122 Type 2 diabetes mellitus with diabetic chronic kidney disease: Secondary | ICD-10-CM | POA: Diagnosis not present

## 2017-09-12 DIAGNOSIS — I69811 Memory deficit following other cerebrovascular disease: Secondary | ICD-10-CM | POA: Diagnosis not present

## 2017-09-12 DIAGNOSIS — I5021 Acute systolic (congestive) heart failure: Secondary | ICD-10-CM | POA: Diagnosis not present

## 2017-09-12 DIAGNOSIS — N183 Chronic kidney disease, stage 3 (moderate): Secondary | ICD-10-CM | POA: Diagnosis not present

## 2017-09-12 DIAGNOSIS — I428 Other cardiomyopathies: Secondary | ICD-10-CM | POA: Diagnosis not present

## 2017-09-12 DIAGNOSIS — I130 Hypertensive heart and chronic kidney disease with heart failure and stage 1 through stage 4 chronic kidney disease, or unspecified chronic kidney disease: Secondary | ICD-10-CM | POA: Diagnosis not present

## 2017-09-13 ENCOUNTER — Ambulatory Visit: Payer: Medicare Other | Admitting: Physician Assistant

## 2017-09-13 DIAGNOSIS — I5021 Acute systolic (congestive) heart failure: Secondary | ICD-10-CM | POA: Diagnosis not present

## 2017-09-13 DIAGNOSIS — I130 Hypertensive heart and chronic kidney disease with heart failure and stage 1 through stage 4 chronic kidney disease, or unspecified chronic kidney disease: Secondary | ICD-10-CM | POA: Diagnosis not present

## 2017-09-13 DIAGNOSIS — I69811 Memory deficit following other cerebrovascular disease: Secondary | ICD-10-CM | POA: Diagnosis not present

## 2017-09-13 DIAGNOSIS — N183 Chronic kidney disease, stage 3 (moderate): Secondary | ICD-10-CM | POA: Diagnosis not present

## 2017-09-13 DIAGNOSIS — I428 Other cardiomyopathies: Secondary | ICD-10-CM | POA: Diagnosis not present

## 2017-09-13 DIAGNOSIS — E1122 Type 2 diabetes mellitus with diabetic chronic kidney disease: Secondary | ICD-10-CM | POA: Diagnosis not present

## 2017-09-14 ENCOUNTER — Telehealth: Payer: Self-pay

## 2017-09-14 NOTE — Telephone Encounter (Signed)
Spoke with pt's son, Juanda Crumble (on dpr), states he had given pt too much of her metformin and just realized that today. Says they will start tomorrow with correct dosing. Says he will have to cb to schedule hosp f/u.

## 2017-09-14 NOTE — Telephone Encounter (Signed)
Candace Long with [employer] left v/m; Angela caregiver called Candace Long with pt having increased confusion since returned home from hospital after starting new meds.today FBS 167; no UTI symptoms and no fever. Candace Long request cb.

## 2017-09-14 NOTE — Telephone Encounter (Signed)
Spoke with Chong Sicilian and she suggested calling pt's son, Juanda Crumble at [phone] or pt's daughter-in-law at [phone] for more info.

## 2017-09-14 NOTE — Telephone Encounter (Signed)
Plz call Candace Long for more info. Would offer appt in office. I haven't seen her for hosp f/u visit. I don't see where anyone has seen her for hosp f/u visit? (PCP or cards).

## 2017-09-15 ENCOUNTER — Other Ambulatory Visit: Payer: Self-pay

## 2017-09-15 DIAGNOSIS — I69811 Memory deficit following other cerebrovascular disease: Secondary | ICD-10-CM | POA: Diagnosis not present

## 2017-09-15 DIAGNOSIS — I428 Other cardiomyopathies: Secondary | ICD-10-CM | POA: Diagnosis not present

## 2017-09-15 DIAGNOSIS — E1122 Type 2 diabetes mellitus with diabetic chronic kidney disease: Secondary | ICD-10-CM | POA: Diagnosis not present

## 2017-09-15 DIAGNOSIS — N183 Chronic kidney disease, stage 3 (moderate): Secondary | ICD-10-CM | POA: Diagnosis not present

## 2017-09-15 DIAGNOSIS — I130 Hypertensive heart and chronic kidney disease with heart failure and stage 1 through stage 4 chronic kidney disease, or unspecified chronic kidney disease: Secondary | ICD-10-CM | POA: Diagnosis not present

## 2017-09-15 DIAGNOSIS — I5021 Acute systolic (congestive) heart failure: Secondary | ICD-10-CM | POA: Diagnosis not present

## 2017-09-15 NOTE — Patient Outreach (Signed)
[facility]) Care Management  09/15/2017  Hayley Horn May 23, 1946 [id number]   Case closure EMMI: Congestive heart failure Referral date: 08/29/18 Referral source: EMMI heart failure red alert Referral reason: Weighed themselves: NO Day # 2  No response from patient after telephone calls and outreach letter attempts.  RNCM will refer patient to care management assistant to close due to being unable to reach.   Quinn Plowman RN,BSN,CCM [facility] Telephonic  [phone]

## 2017-09-17 DIAGNOSIS — N183 Chronic kidney disease, stage 3 (moderate): Secondary | ICD-10-CM | POA: Diagnosis not present

## 2017-09-17 DIAGNOSIS — I428 Other cardiomyopathies: Secondary | ICD-10-CM | POA: Diagnosis not present

## 2017-09-17 DIAGNOSIS — E1122 Type 2 diabetes mellitus with diabetic chronic kidney disease: Secondary | ICD-10-CM | POA: Diagnosis not present

## 2017-09-17 DIAGNOSIS — I69811 Memory deficit following other cerebrovascular disease: Secondary | ICD-10-CM | POA: Diagnosis not present

## 2017-09-17 DIAGNOSIS — I130 Hypertensive heart and chronic kidney disease with heart failure and stage 1 through stage 4 chronic kidney disease, or unspecified chronic kidney disease: Secondary | ICD-10-CM | POA: Diagnosis not present

## 2017-09-17 DIAGNOSIS — I5021 Acute systolic (congestive) heart failure: Secondary | ICD-10-CM | POA: Diagnosis not present

## 2017-09-21 ENCOUNTER — Encounter: Payer: Self-pay | Admitting: Physician Assistant

## 2017-09-21 ENCOUNTER — Ambulatory Visit (INDEPENDENT_AMBULATORY_CARE_PROVIDER_SITE_OTHER): Payer: Medicare Other | Admitting: Physician Assistant

## 2017-09-21 VITALS — BP 132/86 | HR 86 | Ht 69.0 in | Wt 239.0 lb

## 2017-09-21 DIAGNOSIS — I10 Essential (primary) hypertension: Secondary | ICD-10-CM

## 2017-09-21 DIAGNOSIS — I5043 Acute on chronic combined systolic (congestive) and diastolic (congestive) heart failure: Secondary | ICD-10-CM

## 2017-09-21 DIAGNOSIS — Z79899 Other long term (current) drug therapy: Secondary | ICD-10-CM | POA: Diagnosis not present

## 2017-09-21 NOTE — Progress Notes (Signed)
Cardiology Office Note   Date:  09/21/2017   ID:  Candace Long, DOB 18-Aug-1946, MRN [id number]  PCP:  Ria Bush, MD  Cardiologist:  Dr. Percival Spanish, saw in hospital 08/19/2017 Rosaria Ferries, PA-C   Chief Complaint  Patient presents with  . CATH f/u visit    pt c/o SOB on exertion; mild lightheadedness; mild swelling in bilateral legs/ankles/feet    History of Present Illness: Candace Long is a 71 y.o. female with a history of HTN, HLD, CKD III, DM, obesity, cerebral aneurysm 1993 s/p craniotomy for repair and VP shunt w/ memory deficits, thrombocytosis w/ leukopenia and anemia (pending possible BMB per oncology)   Admit 42/5-95/63/8756 for acute systolic CHF. EF 40% by echo, Cath clean. Lasix 40 mg twice a day, no Ace/ARB or spironolactone due to elevated creatinine, NSVT/A tach on tele>>Mg and K+ supplemented. Wt at d/c Candace Long presents for cardiology follow up. She is here today with her son.  She feels she is doing fairly well. She has help being compliant w/ her meds.   She is not waking up in the night SOB. She sleeps on 1 pillow. She feels rested when she wakes.   Her daughter and granddaughter do the cooking. They do some things out of bags/boxes/freezer, but much of the food is fresh.   She has lower extremity edema and believes that she is waking with it. She is not very active, and her legs have gotten weaker. She now has significant difficulty moving herself. She denies dyspnea on exertion but moves very little.   Past Medical History:  Diagnosis Date  . Chronic kidney disease, stage III (moderate) ([place]) 05/24/2014  . HLD (hyperlipidemia)   . Hypertension   . Obesity   . S/P cerebral aneurysm repair 1993   some residual memory impairment  . Uncontrolled type 2 diabetes mellitus with nephropathy [facility])    DSME [facility] 06/2014, did not keep f/u classes so discharged    Past Surgical History:  Procedure Laterality Date  . CARDIOVASCULAR  STRESS TEST  12/2012   WNL EF 55% (Harwani)  . COLONOSCOPY  07/2014   1 tubular adenoma, severe diverticulosis, rpt 5 yrs [facility])  . CRANIOTOMY  1993   cerebral aneurysm repair  . RIGHT/LEFT HEART CATH AND CORONARY ANGIOGRAPHY N/A 08/22/2017   Procedure: RIGHT/LEFT HEART CATH AND CORONARY ANGIOGRAPHY;  Surgeon: Lorretta Harp, MD;  Location: [place] [facility];  Service: Cardiovascular;  Laterality: N/A;  . VENTRICULOPERITONEAL SHUNT  1993    Medication Sig  . albuterol (VENTOLIN HFA) 108 (90 Base) MCG/ACT inhaler Inhale 2 puffs into the lungs every 6 (six) hours as needed for wheezing or shortness of breath.  Marland Kitchen amLODipine (NORVASC) 10 MG tablet TAKE 1 TABLET (10 MG TOTAL) BY MOUTH DAILY.  Marland Kitchen aspirin EC 81 MG tablet Take 81 mg by mouth daily.   Marland Kitchen atorvastatin (LIPITOR) 40 MG tablet TAKE 1 TABLET (40 MG TOTAL) BY MOUTH DAILY.  Marland Kitchen B-D ULTRAFINE III SHORT PEN 31G X 8 MM MISC USE TO INJECT LANTUS ONCE DAILY.  Marland Kitchen Cholecalciferol (VITAMIN D) 1000 UNITS capsule Take 1 capsule (1,000 Units total) by mouth daily.  . furosemide (LASIX) 40 MG tablet Take 1 tablet (40 mg total) by mouth 2 (two) times daily.  . hydrALAZINE (APRESOLINE) 25 MG tablet Take 1 tablet (25 mg total) by mouth every 8 (eight) hours.  . isosorbide mononitrate (IMDUR) 30 MG 24 hr tablet Take 1 tablet (30 mg total) by mouth  daily.  Marland Kitchen LANTUS SOLOSTAR 100 UNIT/ML Solostar Pen INJECT 30 UNITS INTO THE SKIN DAILY. (Patient taking differently: Inject 30 units into the skin at bedtime)  . magnesium oxide (MAG-OX) 400 (241.3 Mg) MG tablet Take 1 tablet (400 mg total) by mouth 2 (two) times daily.  . metFORMIN (GLUCOPHAGE) 1000 MG tablet TAKE 1 TABLET BY MOUTH 2 TIMES DAILY WITH A MEAL.  Marland Kitchen potassium chloride SA (KLOR-CON M20) 20 MEQ tablet Take 2 tablets (40 mEq total) by mouth 2 (two) times daily.  . vitamin B-12 (CYANOCOBALAMIN) 1000 MCG tablet Take 1 tablet (1,000 mcg total) by mouth daily.   No current facility-administered medications  for this visit.     Allergies:   Hydrochlorothiazide    Social History:  The patient  reports that she quit smoking about 8 years ago. Her smoking use included Cigarettes. She smoked 1.00 pack per day. She quit smokeless tobacco use about 6 years ago. Her smokeless tobacco use included Snuff. She reports that she does not drink alcohol or use drugs.   Family History:  The patient's family history includes Diabetes in her mother and sister; Heart failure in her sister; Hypertension in her mother.    ROS:  Please see the history of present illness. All other systems are reviewed and negative.    PHYSICAL EXAM: VS:  BP 132/86 (BP Location: Left Arm, Patient Position: Sitting, Cuff Size: Large)   Pulse 86   Ht 5\' 9"  (1.753 m)   Wt 239 lb (108.4 kg)   BMI 35.29 kg/m  , BMI Body mass index is 35.29 kg/m. GEN: Well nourished, well developed, female in no acute distress  HEENT: normal for age  Neck: minimal JVD, no carotid bruit, no masses Cardiac: slightly irreg R&R; no murmur, no rubs, or gallops Respiratory: decreased BS bases bilaterally, normal work of breathing GI: soft, nontender, nondistended, + BS MS: no deformity or atrophy; 1+ edema; distal pulses are 2+ in all 4 extremities   Skin: warm and dry, no rash Neuro:  Strength and sensation are intact Psych: euthymic mood, full affect   EKG:  EKG is not ordered today.  CARDIAC CATH: 08/22/2017 IMPRESSION:[name] has clean coronary arteries, moderate LV dysfunction and elevated filling pressures along with pulmonary hypertension. She has a nonischemic cardiomyopathy. Given her LVEDP of 33 and her elevated wedge and PA pressures, I suspect she needs continued diuresis and optimization of heart failure medications. Her right common femoral puncture site was field with a MYNX closure device. She left the lab in stable condition. Dr. Meda Coffee was made aware of these results.  ECHO: 08/20/2017 - Left ventricle: The cavity size was  normal. Wall thickness was increased in a pattern of mild LVH. Systolic function was moderately reduced. The estimated ejection fraction was 40%. Diffuse hypokinesis. The study is not technically sufficient to allow evaluation of LV diastolic function. Doppler parameters are consistent with high ventricular filling pressure. - Regional wall motion abnormality: Hypokinesis of the mid inferior and mid inferolateral myocardium. - Aortic valve: Trileaflet; mildly thickened, mildly calcifiedleaflets. - Mitral valve: There was moderate regurgitation. - Tricuspid valve: There was mild-moderate regurgitation. - Pulmonary arteries: PA peak pressure: 47 mm Hg (S). - Inferior vena cava: The vessel was dilated. The respirophasic diameter changes were normal (>50%), consistent with elevated central venous pressure of 8 mm Hg.  Recent Labs: 05/12/2017: ALT 19 08/17/2017: Pro B Natriuretic peptide (BNP) 541.0; TSH 2.25 08/19/2017: B Natriuretic Peptide 566.9 08/23/2017: BUN 15; Creatinine, Ser 1.31; Hemoglobin  10.1; Platelets 491; Potassium 4.1; Sodium 140 08/24/2017: Magnesium 2.0    Lipid Panel    Component Value Date/Time   CHOL 118 08/20/2017 0229   TRIG 74 08/20/2017 0229   TRIG 157 06/29/2013   TRIG 157 06/29/2013   HDL 36 (L) 08/20/2017 0229   CHOLHDL 3.3 08/20/2017 0229   VLDL 15 08/20/2017 0229   LDLCALC 67 08/20/2017 0229   LDLCALC 62 06/29/2013   LDLCALC 62 06/29/2013     Wt Readings from Last 3 Encounters:  09/21/17 239 lb (108.4 kg)  08/24/17 243 lb 12.8 oz (110.6 kg)  08/17/17 245 lb (111.1 kg)     Other studies Reviewed: Additional studies/ records that were reviewed today include: hospital records and testing.  ASSESSMENT AND PLAN:  1.  Acute on chronic combined systolic and diastolic CHF: Her weight Is followed at the [facility] facility. We will get those records faxed to [place] and follow along. At this time, she has some volume overload by exam. I will increase  her Lasix to 80 mg a.m. and 40 mg p.m. for 2 days and see if this helps her volume status. She has not had any labs done since she left the hospital, we will check a BMET today.  The family is encouraged to help her stick to a low sodium diabetic diet. The understand the logic behind fluid restrictions and how this can help her. Hopefully, she will be able to do a little bit better. Decide on ARB versus spironolactone once her labs are reviewed.  2. Hypertension: Her blood pressure is good today in the office. Continue current medical therapy.    Current medicines are reviewed at length with the patient today.  The patient has concerns regarding medicines. Concerns were addressed  The following changes have been made:  Increase Lasix for 2 days  Labs/ tests ordered today include:   Orders Placed This Encounter  Procedures  . Basic metabolic panel     Disposition:   FU with Dr. Percival Spanish  Signed, Rosaria Ferries, PA-C  09/21/2017 5:24 PM    [place] Phone: [phone]; Fax: [phone]  This note was written with the assistance of speech recognition software. Please excuse any transcriptional errors.

## 2017-09-21 NOTE — Patient Instructions (Signed)
Medication Instructions:  Increase lasix to 80mg  in the AM for 2 days, the pm dose stays the same at 40mg   If you need a refill on your cardiac medications before your next appointment, please call your pharmacy.  Labwork: BMET TODAY HERE IN OUR OFFICE AT [facility]  Follow-Up: Your physician wants you to follow-up in: [place].  Special Instructions: 2,000MG  DAILY LOW SODIUM DIET  64oz DAILY FLUID RESTRICTION-THIS IS ALL LIQUIDS  START DAILY WEIGHTS  MAKE SURE YOU DISCUSS WITH YOUR FAMILY ABOUT THE LOW SODIUM AND DIABETIC DIET  Thank you for choosing [facility] at [employer]!!

## 2017-09-22 ENCOUNTER — Telehealth: Payer: Self-pay | Admitting: *Deleted

## 2017-09-22 DIAGNOSIS — E1122 Type 2 diabetes mellitus with diabetic chronic kidney disease: Secondary | ICD-10-CM | POA: Diagnosis not present

## 2017-09-22 DIAGNOSIS — I428 Other cardiomyopathies: Secondary | ICD-10-CM | POA: Diagnosis not present

## 2017-09-22 DIAGNOSIS — I69811 Memory deficit following other cerebrovascular disease: Secondary | ICD-10-CM | POA: Diagnosis not present

## 2017-09-22 DIAGNOSIS — I5021 Acute systolic (congestive) heart failure: Secondary | ICD-10-CM | POA: Diagnosis not present

## 2017-09-22 DIAGNOSIS — N183 Chronic kidney disease, stage 3 (moderate): Secondary | ICD-10-CM | POA: Diagnosis not present

## 2017-09-22 DIAGNOSIS — I130 Hypertensive heart and chronic kidney disease with heart failure and stage 1 through stage 4 chronic kidney disease, or unspecified chronic kidney disease: Secondary | ICD-10-CM | POA: Diagnosis not present

## 2017-09-22 LAB — BASIC METABOLIC PANEL
BUN/Creatinine Ratio: 13 (ref 12–28)
BUN: 19 mg/dL (ref 8–27)
CHLORIDE: 98 mmol/L (ref 96–106)
CO2: 21 mmol/L (ref 20–29)
Calcium: 9.9 mg/dL (ref 8.7–10.3)
Creatinine, Ser: 1.45 mg/dL — ABNORMAL HIGH (ref 0.57–1.00)
GFR calc Af Amer: 42 mL/min/{1.73_m2} — ABNORMAL LOW (ref >59)
GFR, EST NON AFRICAN AMERICAN: 37 mL/min/{1.73_m2} — AB (ref >59)
Glucose: 53 mg/dL — ABNORMAL LOW (ref 65–99)
POTASSIUM: 4.4 mmol/L (ref 3.5–5.2)
SODIUM: 137 mmol/L (ref 134–144)

## 2017-09-22 NOTE — Telephone Encounter (Signed)
Spoke to patty who states she saw pt today and her BS was 375. The daughter in law states pt ate pancakes and syrup for breakfast 2hrs before patty arrived. Wanted to send to Dr Darnell Level as Juluis Rainier and to see if he wanted to make any modifications to her insulin, which she takes in the evening. pls advise

## 2017-09-23 NOTE — Telephone Encounter (Signed)
Lab Results  Component Value Date   HGBA1C 6.0 08/17/2017    Rather than change insulin at this time would just recommend closer adherence to diabetic diet - low sugar, low carb. Pancakes and syrup will always shoot sugars up very high, but they should come back down. Prior A1c showed great control.

## 2017-09-26 DIAGNOSIS — I130 Hypertensive heart and chronic kidney disease with heart failure and stage 1 through stage 4 chronic kidney disease, or unspecified chronic kidney disease: Secondary | ICD-10-CM | POA: Diagnosis not present

## 2017-09-26 DIAGNOSIS — I5021 Acute systolic (congestive) heart failure: Secondary | ICD-10-CM | POA: Diagnosis not present

## 2017-09-26 DIAGNOSIS — I69811 Memory deficit following other cerebrovascular disease: Secondary | ICD-10-CM | POA: Diagnosis not present

## 2017-09-26 DIAGNOSIS — I428 Other cardiomyopathies: Secondary | ICD-10-CM | POA: Diagnosis not present

## 2017-09-26 DIAGNOSIS — N183 Chronic kidney disease, stage 3 (moderate): Secondary | ICD-10-CM | POA: Diagnosis not present

## 2017-09-26 DIAGNOSIS — E1122 Type 2 diabetes mellitus with diabetic chronic kidney disease: Secondary | ICD-10-CM | POA: Diagnosis not present

## 2017-09-26 NOTE — Telephone Encounter (Signed)
Spoke with Patty at [facility] relaying message per Dr. Oneita Jolly she understands and will try to encourage pt to adhere to diet.

## 2017-09-29 DIAGNOSIS — I69811 Memory deficit following other cerebrovascular disease: Secondary | ICD-10-CM | POA: Diagnosis not present

## 2017-09-29 DIAGNOSIS — I130 Hypertensive heart and chronic kidney disease with heart failure and stage 1 through stage 4 chronic kidney disease, or unspecified chronic kidney disease: Secondary | ICD-10-CM | POA: Diagnosis not present

## 2017-09-29 DIAGNOSIS — E1122 Type 2 diabetes mellitus with diabetic chronic kidney disease: Secondary | ICD-10-CM | POA: Diagnosis not present

## 2017-09-29 DIAGNOSIS — N183 Chronic kidney disease, stage 3 (moderate): Secondary | ICD-10-CM | POA: Diagnosis not present

## 2017-09-29 DIAGNOSIS — I428 Other cardiomyopathies: Secondary | ICD-10-CM | POA: Diagnosis not present

## 2017-09-29 DIAGNOSIS — I5021 Acute systolic (congestive) heart failure: Secondary | ICD-10-CM | POA: Diagnosis not present

## 2017-09-30 ENCOUNTER — Telehealth: Payer: Self-pay | Admitting: Cardiology

## 2017-09-30 DIAGNOSIS — N183 Chronic kidney disease, stage 3 (moderate): Secondary | ICD-10-CM | POA: Diagnosis not present

## 2017-09-30 DIAGNOSIS — I5021 Acute systolic (congestive) heart failure: Secondary | ICD-10-CM | POA: Diagnosis not present

## 2017-09-30 DIAGNOSIS — I69811 Memory deficit following other cerebrovascular disease: Secondary | ICD-10-CM | POA: Diagnosis not present

## 2017-09-30 DIAGNOSIS — E1122 Type 2 diabetes mellitus with diabetic chronic kidney disease: Secondary | ICD-10-CM | POA: Diagnosis not present

## 2017-09-30 DIAGNOSIS — I428 Other cardiomyopathies: Secondary | ICD-10-CM | POA: Diagnosis not present

## 2017-09-30 DIAGNOSIS — I130 Hypertensive heart and chronic kidney disease with heart failure and stage 1 through stage 4 chronic kidney disease, or unspecified chronic kidney disease: Secondary | ICD-10-CM | POA: Diagnosis not present

## 2017-09-30 NOTE — Telephone Encounter (Signed)
Agree 

## 2017-09-30 NOTE — Telephone Encounter (Signed)
Returned call. Spoke to [facility] Producer, television/film/video. She reports pt has been having increased wheezing, SOB w ambulation, weight gain since the beginning of the week (10/13) of about 3 lbs.  Pt saw [place] on the 10th of October, was given instruction/reinforcement teaching on fluid restriction, salt avoidance, etc. At that time, she was advised to increase diuretic x 2 days which seemed to help  Beyond SOB w ambulation and wheezing, no other symptoms reported today.  Advised to take extra 40mg  lasix today, repeat tomorrow AM and Sunday AM if needed, call on Monday to give [place] update. Call over weekend for on-call service if any worsening of symptoms.  Recommended she plan to keep appt 11/8 w Lurena Joiner unless MD recommends sooner f/u. Caller voiced agreement w plan and will communicate this w patient.  Routed to Dr. Percival Spanish for any further advice.

## 2017-09-30 NOTE — Telephone Encounter (Signed)
°  New Prob  States pts has gained almost 3 pounds in 1 week. Able to hear wheezing at rest which is worsened with exertion. Requesting to speak to a nurse regarding pts symptoms. Please call.

## 2017-10-04 ENCOUNTER — Telehealth: Payer: Self-pay | Admitting: Cardiology

## 2017-10-04 DIAGNOSIS — I69811 Memory deficit following other cerebrovascular disease: Secondary | ICD-10-CM | POA: Diagnosis not present

## 2017-10-04 DIAGNOSIS — E1122 Type 2 diabetes mellitus with diabetic chronic kidney disease: Secondary | ICD-10-CM | POA: Diagnosis not present

## 2017-10-04 DIAGNOSIS — I130 Hypertensive heart and chronic kidney disease with heart failure and stage 1 through stage 4 chronic kidney disease, or unspecified chronic kidney disease: Secondary | ICD-10-CM | POA: Diagnosis not present

## 2017-10-04 DIAGNOSIS — N183 Chronic kidney disease, stage 3 (moderate): Secondary | ICD-10-CM | POA: Diagnosis not present

## 2017-10-04 DIAGNOSIS — I5021 Acute systolic (congestive) heart failure: Secondary | ICD-10-CM | POA: Diagnosis not present

## 2017-10-04 DIAGNOSIS — I428 Other cardiomyopathies: Secondary | ICD-10-CM | POA: Diagnosis not present

## 2017-10-04 NOTE — Telephone Encounter (Signed)
Left message to call back  

## 2017-10-04 NOTE — Telephone Encounter (Signed)
Spoke with [name] RN with [facility]  Patient breathing does not seem to be any worse. Weight on 10/18 was 240.8 and today 240. Was reported to the nurse that there was a weight gain of a few pounds when PT saw patient on 10/01/17 but nurse unable to locate any documented weights. Patients blood pressure on 10/01/17 150/96. Patient did increase Furosemide as advised 09/30/17. Nurse stated wheezing had been reported but when she saw patient today blood pressure 130/80 and NO wheezing on exam. Reviewed medications with nurse. Her breathing no worse, continues to have swelling. Will forward to Rhonad B PA who saw patient 09/21/17 Patient has follow up scheduled 10/20/17

## 2017-10-04 NOTE — Telephone Encounter (Signed)
Ask her to increase her Lasix to 80 mg a.m. and 40 mg p.m. If her symptoms worsen, she will need to go to the emergency room. If she is stabilized or improved, please move her follow-up appointment up to next week. Thanks

## 2017-10-04 NOTE — Telephone Encounter (Signed)
New message    Candace Long is calling to report about pt SOB.  Pt c/o swelling: STAT is pt has developed SOB within 24 hours  1) How much weight have you gained and in what time span? 240 lbs today  2) If swelling, where is the swelling located? Feet   3) Are you currently taking a fluid pill? Yes   4) Are you currently SOB? Yes   5) Do you have a log of your daily weights (if so, list)? Yes-her son has them.   6) Have you gained 3 pounds in a day or 5 pounds in a week? 240.8-18th   7) Have you traveled recently? No

## 2017-10-05 MED ORDER — FUROSEMIDE 40 MG PO TABS: ORAL_TABLET | ORAL | 1 refills | Status: DC

## 2017-10-05 NOTE — Telephone Encounter (Signed)
Spoke with pt son, he repeated medication changes and wrote them down. follow up appointment moved up to next week.

## 2017-10-07 DIAGNOSIS — I428 Other cardiomyopathies: Secondary | ICD-10-CM | POA: Diagnosis not present

## 2017-10-07 DIAGNOSIS — N183 Chronic kidney disease, stage 3 (moderate): Secondary | ICD-10-CM | POA: Diagnosis not present

## 2017-10-07 DIAGNOSIS — I5021 Acute systolic (congestive) heart failure: Secondary | ICD-10-CM | POA: Diagnosis not present

## 2017-10-07 DIAGNOSIS — I69811 Memory deficit following other cerebrovascular disease: Secondary | ICD-10-CM | POA: Diagnosis not present

## 2017-10-07 DIAGNOSIS — I130 Hypertensive heart and chronic kidney disease with heart failure and stage 1 through stage 4 chronic kidney disease, or unspecified chronic kidney disease: Secondary | ICD-10-CM | POA: Diagnosis not present

## 2017-10-07 DIAGNOSIS — E1122 Type 2 diabetes mellitus with diabetic chronic kidney disease: Secondary | ICD-10-CM | POA: Diagnosis not present

## 2017-10-10 ENCOUNTER — Encounter: Payer: Self-pay | Admitting: Physician Assistant

## 2017-10-10 ENCOUNTER — Ambulatory Visit (INDEPENDENT_AMBULATORY_CARE_PROVIDER_SITE_OTHER): Payer: Medicare Other | Admitting: Physician Assistant

## 2017-10-10 VITALS — BP 136/74 | HR 90 | Ht 69.0 in | Wt 231.0 lb

## 2017-10-10 DIAGNOSIS — I5021 Acute systolic (congestive) heart failure: Secondary | ICD-10-CM | POA: Diagnosis not present

## 2017-10-10 DIAGNOSIS — Z79899 Other long term (current) drug therapy: Secondary | ICD-10-CM | POA: Diagnosis not present

## 2017-10-10 DIAGNOSIS — I428 Other cardiomyopathies: Secondary | ICD-10-CM | POA: Diagnosis not present

## 2017-10-10 DIAGNOSIS — I471 Supraventricular tachycardia: Secondary | ICD-10-CM

## 2017-10-10 DIAGNOSIS — I5042 Chronic combined systolic (congestive) and diastolic (congestive) heart failure: Secondary | ICD-10-CM

## 2017-10-10 MED ORDER — FUROSEMIDE 40 MG PO TABS: ORAL_TABLET | ORAL | 1 refills | Status: DC

## 2017-10-10 MED ORDER — CARVEDILOL 3.125 MG PO TABS: 3.1250 mg | ORAL_TABLET | Freq: Two times a day (BID) | ORAL | 3 refills | Status: AC

## 2017-10-10 NOTE — Patient Instructions (Signed)
Medication Instructions:  CHANGE LASIX  80MG  IN THE AM AND 40MG  IN THE PM Monday, Wednesday AND Friday, 58 TWICE DAILY ALL OTHER DAYS.  START CARVEDILOL 3.215MG  DAILY If you need a refill on your cardiac medications before your next appointment, please call your pharmacy.  Labwork: BMET TODAY HERE IN OUR OFFICE AT [facility]  Special Instructions: DAILY WEIGHTS  LOW SODIUM DIET  Follow-Up: Your physician wants you to follow-up in: [place].  Thank you for choosing [facility] at [facility]!!     Low-Sodium Eating Plan Sodium, which is an element that makes up salt, helps you maintain a healthy balance of fluids in your body. Too much sodium can increase your blood pressure and cause fluid and waste to be held in your body. Your health care provider or dietitian may recommend following this plan if you have high blood pressure (hypertension), kidney disease, liver disease, or heart failure. Eating less sodium can help lower your blood pressure, reduce swelling, and protect your heart, liver, and kidneys. What are tips for following this plan? General guidelines  Most people on this plan should limit their sodium intake to 1,500-2,000 mg (milligrams) of sodium each day. Reading food labels  The Nutrition Facts label lists the amount of sodium in one serving of the food. If you eat more than one serving, you must multiply the listed amount of sodium by the number of servings.  Choose foods with less than 140 mg of sodium per serving.  Avoid foods with 300 mg of sodium or more per serving. Shopping  Look for lower-sodium products, often labeled as "low-sodium" or "no salt added."  Always check the sodium content even if foods are labeled as "unsalted" or "no salt added".  Buy fresh foods. ? Avoid canned foods and premade or frozen meals. ? Avoid canned, cured, or processed meats  Buy breads that have less than 80 mg of sodium per slice. Cooking  Eat more  home-cooked food and less restaurant, buffet, and fast food.  Avoid adding salt when cooking. Use salt-free seasonings or herbs instead of table salt or sea salt. Check with your health care provider or pharmacist before using salt substitutes.  Cook with plant-based oils, such as canola, sunflower, or olive oil. Meal planning  When eating at a restaurant, ask that your food be prepared with less salt or no salt, if possible.  Avoid foods that contain MSG (monosodium glutamate). MSG is sometimes added to [place] food, bouillon, and some canned foods. What foods are recommended? The items listed may not be a complete list. Talk with your dietitian about what dietary choices are best for you. Grains Low-sodium cereals, including oats, puffed wheat and rice, and shredded wheat. Low-sodium crackers. Unsalted rice. Unsalted pasta. Low-sodium bread. Whole-grain breads and whole-grain pasta. Vegetables Fresh or frozen vegetables. "No salt added" canned vegetables. "No salt added" tomato sauce and paste. Low-sodium or reduced-sodium tomato and vegetable juice. Fruits Fresh, frozen, or canned fruit. Fruit juice. Meats and other protein foods Fresh or frozen (no salt added) meat, poultry, seafood, and fish. Low-sodium canned tuna and salmon. Unsalted nuts. Dried peas, beans, and lentils without added salt. Unsalted canned beans. Eggs. Unsalted nut butters. Dairy Milk. Soy milk. Cheese that is naturally low in sodium, such as ricotta cheese, fresh mozzarella, or Swiss cheese Low-sodium or reduced-sodium cheese. Cream cheese. Yogurt. Fats and oils Unsalted butter. Unsalted margarine with no trans fat. Vegetable oils such as canola or olive oils. Seasonings and other  foods Fresh and dried herbs and spices. Salt-free seasonings. Low-sodium mustard and ketchup. Sodium-free salad dressing. Sodium-free light mayonnaise. Fresh or refrigerated horseradish. Lemon juice. Vinegar. Homemade, reduced-sodium, or  low-sodium soups. Unsalted popcorn and pretzels. Low-salt or salt-free chips. What foods are not recommended? The items listed may not be a complete list. Talk with your dietitian about what dietary choices are best for you. Grains Instant hot cereals. Bread stuffing, pancake, and biscuit mixes. Croutons. Seasoned rice or pasta mixes. Noodle soup cups. Boxed or frozen macaroni and cheese. Regular salted crackers. Self-rising flour. Vegetables Sauerkraut, pickled vegetables, and relishes. Olives. [place] fries. Onion rings. Regular canned vegetables (not low-sodium or reduced-sodium). Regular canned tomato sauce and paste (not low-sodium or reduced-sodium). Regular tomato and vegetable juice (not low-sodium or reduced-sodium). Frozen vegetables in sauces. Meats and other protein foods Meat or fish that is salted, canned, smoked, spiced, or pickled. Bacon, ham, sausage, hotdogs, corned beef, chipped beef, packaged lunch meats, salt pork, jerky, pickled herring, anchovies, regular canned tuna, sardines, salted nuts. Dairy Processed cheese and cheese spreads. Cheese curds. Blue cheese. Feta cheese. String cheese. Regular cottage cheese. Buttermilk. Canned milk. Fats and oils Salted butter. Regular margarine. Ghee. Bacon fat. Seasonings and other foods Onion salt, garlic salt, seasoned salt, table salt, and sea salt. Canned and packaged gravies. Worcestershire sauce. Tartar sauce. Barbecue sauce. Teriyaki sauce. Soy sauce, including reduced-sodium. Steak sauce. Fish sauce. Oyster sauce. Cocktail sauce. Horseradish that you find on the shelf. Regular ketchup and mustard. Meat flavorings and tenderizers. Bouillon cubes. Hot sauce and Tabasco sauce. Premade or packaged marinades. Premade or packaged taco seasonings. Relishes. Regular salad dressings. Salsa. Potato and tortilla chips. Corn chips and puffs. Salted popcorn and pretzels. Canned or dried soups. Pizza. Frozen entrees and pot pies. Summary  Eating  less sodium can help lower your blood pressure, reduce swelling, and protect your heart, liver, and kidneys.  Most people on this plan should limit their sodium intake to 1,500-2,000 mg (milligrams) of sodium each day.  Canned, boxed, and frozen foods are high in sodium. Restaurant foods, fast foods, and pizza are also very high in sodium. You also get sodium by adding salt to food.  Try to cook at home, eat more fresh fruits and vegetables, and eat less fast food, canned, processed, or prepared foods. This information is not intended to replace advice given to you by your health care provider. Make sure you discuss any questions you have with your health care provider. Document Released: 05/21/2002 Document Revised: 11/22/2016 Document Reviewed: 11/22/2016 Elsevier Interactive Patient Education  2017 [employer].

## 2017-10-10 NOTE — Progress Notes (Signed)
Cardiology Office Note   Date:  10/10/2017   ID:  Candace Long, DOB Mar 21, 1946, MRN [id number]  PCP:  Candace Bush, MD   Cardiologist:  Dr. Percival Long, 08/19/2017 in [place], [place] 09/21/2017  Chief Complaint  Patient presents with  . Follow-up    3 weeks  . Chest Pain  . Edema    Ankles.    History of Present Illness: Candace Long is a 71 y.o. 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 Weight 239 pounds, volume overload on exam, Lasix increased x 2 days. 10/19 and 10/23 phone notes regarding swelling increasing shortness of breath, Lasix increased and appointment moved up  Candace Long presents for cardiology follow up.  Her breathing has been getting a little better.   She complained of some CP yesterday before church. However, she got to church and did well. It was not exertional. She thinks she has been waking up with some LE edema, but it is much better. She denies PND, no orthopnea. She admits that she is not sure if she is waking up with lower extremity edema or not. Her son has not noticed any but does notice it later in the day.  She has not been exercising much, is walking with PT. She does not do much otherwise, she does not like to exercise.    She seems to have more energy and more animation today than she did on her previous visit. In general, she looks better.   Past Medical History:  Diagnosis Date  . Chronic kidney disease, stage III (moderate) (Candace Long) 05/24/2014  . HLD (hyperlipidemia)   . Hypertension   . Obesity   . S/P cerebral aneurysm repair 1993   some residual memory impairment  . Uncontrolled type 2 diabetes mellitus with nephropathy [facility])    DSME [facility] 06/2014, did  not keep f/u classes so discharged    Past Surgical History:  Procedure Laterality Date  . CARDIOVASCULAR STRESS TEST  12/2012   WNL EF 55% (Harwani)  . COLONOSCOPY  07/2014   1 tubular adenoma, severe diverticulosis, rpt 5 yrs Candace Long)  . CRANIOTOMY  1993   cerebral aneurysm repair  . RIGHT/LEFT HEART CATH AND CORONARY ANGIOGRAPHY N/A 08/22/2017   Procedure: RIGHT/LEFT HEART CATH AND CORONARY ANGIOGRAPHY;  Surgeon: Lorretta Harp, MD;  Location: [place] [facility];  Service: Cardiovascular;  Laterality: N/A;  . VENTRICULOPERITONEAL SHUNT  1993    Current Outpatient Prescriptions  Medication Sig Dispense Refill  . albuterol (VENTOLIN HFA) 108 (90 Base) MCG/ACT inhaler Inhale 2 puffs into the lungs every 6 (six) hours as needed for wheezing or shortness of breath. 1 Inhaler 3  . amLODipine (NORVASC) 10 MG tablet TAKE 1 TABLET (10 MG TOTAL) BY MOUTH DAILY. 90 tablet 1  . aspirin EC 81 MG tablet Take 81 mg by mouth daily.     Marland Kitchen atorvastatin (LIPITOR) 40 MG tablet TAKE 1 TABLET (40 MG TOTAL) BY MOUTH DAILY. 90 tablet 2  . B-D ULTRAFINE III SHORT PEN 31G X 8 MM MISC USE TO INJECT LANTUS ONCE DAILY. 100 each 3  . Cholecalciferol (VITAMIN D) 1000 UNITS capsule Take 1 capsule (1,000 Units total) by mouth daily.    . furosemide (LASIX) 40 MG  tablet 2 tablets in the morning and 1 tablet in the afternoon 60 tablet 1  . hydrALAZINE (APRESOLINE) 25 MG tablet Take 1 tablet (25 mg total) by mouth every 8 (eight) hours. 90 tablet 1  . isosorbide mononitrate (IMDUR) 30 MG 24 hr tablet Take 1 tablet (30 mg total) by mouth daily. 30 tablet 1  . LANTUS SOLOSTAR 100 UNIT/ML Solostar Pen INJECT 30 UNITS INTO THE SKIN DAILY. (Patient taking differently: Inject 30 units into the skin at bedtime) 15 pen 3  . magnesium oxide (MAG-OX) 400 (241.3 Mg) MG tablet Take 1 tablet (400 mg total) by mouth 2 (two) times daily. 60 tablet 1  . metFORMIN (GLUCOPHAGE) 1000 MG tablet TAKE 1 TABLET BY MOUTH 2 TIMES DAILY WITH A  MEAL. 60 tablet 5  . potassium chloride SA (KLOR-CON M20) 20 MEQ tablet Take 2 tablets (40 mEq total) by mouth 2 (two) times daily. 120 tablet 1  . vitamin B-12 (CYANOCOBALAMIN) 1000 MCG tablet Take 1 tablet (1,000 mcg total) by mouth daily.     No current facility-administered medications for this visit.     Allergies:   Hydrochlorothiazide    Social History:  The patient  reports that she quit smoking about 8 years ago. Her smoking use included Cigarettes. She smoked 1.00 pack per day. She quit smokeless tobacco use about 6 years ago. Her smokeless tobacco use included Snuff. She reports that she does not drink alcohol or use drugs.   Family History:  The patient's family history includes Diabetes in her mother and sister; Heart failure in her sister; Hypertension in her mother.    ROS:  Please see the history of present illness. All other systems are reviewed and negative.    PHYSICAL EXAM: VS:  BP 136/74   Pulse 90   Ht 5\' 9"  (1.753 m)   Wt 231 lb (104.8 kg)   BMI 34.11 kg/m  , BMI Body mass index is 34.11 kg/m. GEN: Well nourished, well developed, female in no acute distress  HEENT: normal for age  Neck: minimal JVD, no carotid bruit, no masses Cardiac: RRR; soft murmur, no rubs, or gallops Respiratory:  clear to auscultation bilaterally, normal work of breathing GI: soft, nontender, nondistended, + BS MS: no deformity or atrophy; trace LE edema; distal pulses are 2+ in all 4 extremities   Skin: warm and dry, no rash Neuro:  Strength and sensation are intact Psych: euthymic mood, full affect   EKG:  EKG is ordered today. The ekg ordered today demonstrates sinus rhythm, heart rate 90, rare PACs versus sinus arrhythmia  ECHO: 08/20/2017 - Left ventricle: The cavity size was normal. Wall thickness was   increased in a pattern of mild LVH. Systolic function was   moderately reduced. The estimated ejection fraction was 40%.   Diffuse hypokinesis. The study is not  technically sufficient to   allow evaluation of LV diastolic function. Doppler parameters are   consistent with high ventricular filling pressure. - Regional wall motion abnormality: Hypokinesis of the mid inferior   and mid inferolateral myocardium. - Aortic valve: Trileaflet; mildly thickened, mildly calcified leaflets. - Mitral valve: There was moderate regurgitation. - Tricuspid valve: There was mild-moderate regurgitation. - Pulmonary arteries: PA peak pressure: 47 mm Hg (S). - Inferior vena cava: The vessel was dilated. The respirophasic   diameter changes were normal (> 50%), consistent with elevated   central venous pressure of 8 mm Hg.  CARDIAC CATH: 08/22/2017 IMPRESSION:Ms. Allmendinger has clean coronary arteries, moderate  LV dysfunction and elevated filling pressures along with pulmonary hypertension. She has a nonischemic cardiomyopathy. Given her LVEDP of 33 and her elevated wedge and PA pressures, I suspect she needs continued diuresis and optimization of heart failure medications. Her right common femoral puncture site was field with a MYNX closure device. She left the lab in stable condition. Dr. Meda Coffee was made aware of these results.   Recent Labs: 05/12/2017: ALT 19 08/17/2017: Pro B Natriuretic peptide (BNP) 541.0; TSH 2.25 08/19/2017: B Natriuretic Peptide 566.9 08/23/2017: Hemoglobin 10.1; Platelets 491 08/24/2017: Magnesium 2.0 09/21/2017: BUN 19; Creatinine, Ser 1.45; Potassium 4.4; Sodium 137    Lipid Panel    Component Value Date/Time   CHOL 118 08/20/2017 0229   TRIG 74 08/20/2017 0229   TRIG 157 06/29/2013   TRIG 157 06/29/2013   HDL 36 (L) 08/20/2017 0229   CHOLHDL 3.3 08/20/2017 0229   VLDL 15 08/20/2017 0229   LDLCALC 67 08/20/2017 0229   LDLCALC 62 06/29/2013   LDLCALC 62 06/29/2013     Wt Readings from Last 3 Encounters:  10/10/17 231 lb (104.8 kg)  09/21/17 239 lb (108.4 kg)  08/24/17 243 lb 12.8 oz (110.6 kg)     Other studies  Reviewed: Additional studies/ records that were reviewed today include: office notes, hospital records and testing.  ASSESSMENT AND PLAN:  1.  Chronic combined systolic and diastolic CHF: Her weight is down and her volume status is improved. Based on weights and lower extremity edema, her Lasix dose has fluctuated since she was last seen. I will try increasing her Lasix from 40 mg twice a day 280 mg a.m. and 40 mg p.m. on 3 days a week, 40 mg twice a day the other days of the week. Perhaps this will allow her weight to stabilize a little bit more consistently and she will not have to have so many changes. Continue to follow daily weights, sodium intake and fluid intake. Explained to the son that this condition can be managed, but not cured. Check a BMET  2. Hypertension: Her son is concerned that she is on so many medications. However, I explained that they do different things and so she really needs all of these.  3. Nonischemic Cardiomyopathy: She is on Lasix, hydralazine, and nitrates. She has not been on ACE/ARB or spironolactone due to her renal function. I explained the reason for adding a beta blocker and will try this. Because her EF is decreased, if she needed to cut back on anything, I would to the amlodipine. However, I believe her blood pressure will tolerate the addition of carvedilol.   Current medicines are reviewed at length with the patient today.  The patient has concerns regarding medicines.  The following changes have been made:  Add carvedilol, scheduled Lasix 80 mg a.m. and 40 mg p.m. 3 days a week  Labs/ tests ordered today include:   Orders Placed This Encounter  Procedures  . Basic metabolic panel  . EKG 12-Lead     Disposition:   FU with Dr. Percival Long  Signed, Barrett, [name], PA-C  10/10/2017 4:11 PM    [place] Phone: [phone]; Fax: [phone]  This note was written with the assistance of speech recognition software.  Please excuse any transcriptional errors.

## 2017-10-11 LAB — BASIC METABOLIC PANEL
BUN/Creatinine Ratio: 15 (ref 12–28)
BUN: 22 mg/dL (ref 8–27)
CO2: 26 mmol/L (ref 20–29)
Calcium: 9.7 mg/dL (ref 8.7–10.3)
Chloride: 97 mmol/L (ref 96–106)
Creatinine, Ser: 1.47 mg/dL — ABNORMAL HIGH (ref 0.57–1.00)
GFR calc Af Amer: 42 mL/min/{1.73_m2} — ABNORMAL LOW (ref >59)
GFR calc non Af Amer: 36 mL/min/{1.73_m2} — ABNORMAL LOW (ref >59)
Glucose: 99 mg/dL (ref 65–99)
Potassium: 4.2 mmol/L (ref 3.5–5.2)
Sodium: 141 mmol/L (ref 134–144)

## 2017-10-12 ENCOUNTER — Telehealth: Payer: Self-pay | Admitting: Cardiology

## 2017-10-12 DIAGNOSIS — I69811 Memory deficit following other cerebrovascular disease: Secondary | ICD-10-CM | POA: Diagnosis not present

## 2017-10-12 DIAGNOSIS — I130 Hypertensive heart and chronic kidney disease with heart failure and stage 1 through stage 4 chronic kidney disease, or unspecified chronic kidney disease: Secondary | ICD-10-CM | POA: Diagnosis not present

## 2017-10-12 DIAGNOSIS — I5021 Acute systolic (congestive) heart failure: Secondary | ICD-10-CM | POA: Diagnosis not present

## 2017-10-12 DIAGNOSIS — E1122 Type 2 diabetes mellitus with diabetic chronic kidney disease: Secondary | ICD-10-CM | POA: Diagnosis not present

## 2017-10-12 DIAGNOSIS — I428 Other cardiomyopathies: Secondary | ICD-10-CM | POA: Diagnosis not present

## 2017-10-12 DIAGNOSIS — N183 Chronic kidney disease, stage 3 (moderate): Secondary | ICD-10-CM | POA: Diagnosis not present

## 2017-10-12 NOTE — Telephone Encounter (Signed)
[name]([facility]) is calling because she is with MRs. [place] and she is having some wheezing on her chest and is not sure if it is coming from her CHF. Please call

## 2017-10-12 NOTE — Telephone Encounter (Signed)
Returned call to [name]-[facility] she states that she informed pt that this is not her CHF but pt is wheezing and son states that they have an inhaler and they will try this and see "how it goes" he will call back if this dos not help. BP today per [name] 120/78 HR 61 denies any chest pain or pressure wt is down. [name] just wanted to let Dr Percival Spanish know of this episode

## 2017-10-13 ENCOUNTER — Emergency Department (HOSPITAL_COMMUNITY): Payer: Medicare Other

## 2017-10-13 ENCOUNTER — Encounter (HOSPITAL_COMMUNITY): Payer: Self-pay | Admitting: Emergency Medicine

## 2017-10-13 ENCOUNTER — Inpatient Hospital Stay (HOSPITAL_COMMUNITY)
Admission: EM | Admit: 2017-10-13 | Discharge: 2017-10-21 | DRG: 193 | Disposition: A | Discharge status: Home / Self Care | Payer: Medicare Other | Attending: Family Medicine | Admitting: Family Medicine

## 2017-10-13 DIAGNOSIS — D709 Neutropenia, unspecified: Secondary | ICD-10-CM | POA: Diagnosis not present

## 2017-10-13 DIAGNOSIS — I5021 Acute systolic (congestive) heart failure: Secondary | ICD-10-CM | POA: Diagnosis not present

## 2017-10-13 DIAGNOSIS — Z87891 Personal history of nicotine dependence: Secondary | ICD-10-CM | POA: Diagnosis not present

## 2017-10-13 DIAGNOSIS — N183 Chronic kidney disease, stage 3 unspecified: Secondary | ICD-10-CM | POA: Diagnosis present

## 2017-10-13 DIAGNOSIS — E871 Hypo-osmolality and hyponatremia: Secondary | ICD-10-CM | POA: Diagnosis present

## 2017-10-13 DIAGNOSIS — Z794 Long term (current) use of insulin: Secondary | ICD-10-CM | POA: Diagnosis not present

## 2017-10-13 DIAGNOSIS — R0602 Shortness of breath: Secondary | ICD-10-CM | POA: Diagnosis not present

## 2017-10-13 DIAGNOSIS — E1122 Type 2 diabetes mellitus with diabetic chronic kidney disease: Secondary | ICD-10-CM | POA: Diagnosis present

## 2017-10-13 DIAGNOSIS — D649 Anemia, unspecified: Secondary | ICD-10-CM | POA: Diagnosis not present

## 2017-10-13 DIAGNOSIS — E538 Deficiency of other specified B group vitamins: Secondary | ICD-10-CM | POA: Diagnosis not present

## 2017-10-13 DIAGNOSIS — E559 Vitamin D deficiency, unspecified: Secondary | ICD-10-CM | POA: Diagnosis present

## 2017-10-13 DIAGNOSIS — R000 Tachycardia, unspecified: Secondary | ICD-10-CM | POA: Diagnosis not present

## 2017-10-13 DIAGNOSIS — R6889 Other general symptoms and signs: Secondary | ICD-10-CM | POA: Diagnosis not present

## 2017-10-13 DIAGNOSIS — N390 Urinary tract infection, site not specified: Secondary | ICD-10-CM | POA: Diagnosis not present

## 2017-10-13 DIAGNOSIS — Z79899 Other long term (current) drug therapy: Secondary | ICD-10-CM | POA: Diagnosis not present

## 2017-10-13 DIAGNOSIS — I5023 Acute on chronic systolic (congestive) heart failure: Secondary | ICD-10-CM | POA: Diagnosis present

## 2017-10-13 DIAGNOSIS — I429 Cardiomyopathy, unspecified: Secondary | ICD-10-CM | POA: Diagnosis present

## 2017-10-13 DIAGNOSIS — I130 Hypertensive heart and chronic kidney disease with heart failure and stage 1 through stage 4 chronic kidney disease, or unspecified chronic kidney disease: Secondary | ICD-10-CM | POA: Diagnosis present

## 2017-10-13 DIAGNOSIS — J984 Other disorders of lung: Secondary | ICD-10-CM | POA: Diagnosis not present

## 2017-10-13 DIAGNOSIS — R7989 Other specified abnormal findings of blood chemistry: Secondary | ICD-10-CM | POA: Diagnosis present

## 2017-10-13 DIAGNOSIS — Z7982 Long term (current) use of aspirin: Secondary | ICD-10-CM

## 2017-10-13 DIAGNOSIS — D631 Anemia in chronic kidney disease: Secondary | ICD-10-CM | POA: Diagnosis present

## 2017-10-13 DIAGNOSIS — Z881 Allergy status to other antibiotic agents status: Secondary | ICD-10-CM

## 2017-10-13 DIAGNOSIS — D72819 Decreased white blood cell count, unspecified: Secondary | ICD-10-CM | POA: Diagnosis not present

## 2017-10-13 DIAGNOSIS — I10 Essential (primary) hypertension: Secondary | ICD-10-CM | POA: Diagnosis not present

## 2017-10-13 DIAGNOSIS — Z7984 Long term (current) use of oral hypoglycemic drugs: Secondary | ICD-10-CM

## 2017-10-13 DIAGNOSIS — Y95 Nosocomial condition: Secondary | ICD-10-CM | POA: Diagnosis present

## 2017-10-13 DIAGNOSIS — R410 Disorientation, unspecified: Secondary | ICD-10-CM | POA: Diagnosis not present

## 2017-10-13 DIAGNOSIS — Z888 Allergy status to other drugs, medicaments and biological substances status: Secondary | ICD-10-CM

## 2017-10-13 DIAGNOSIS — J9801 Acute bronchospasm: Secondary | ICD-10-CM

## 2017-10-13 DIAGNOSIS — E870 Hyperosmolality and hypernatremia: Secondary | ICD-10-CM | POA: Diagnosis not present

## 2017-10-13 DIAGNOSIS — E1121 Type 2 diabetes mellitus with diabetic nephropathy: Secondary | ICD-10-CM | POA: Diagnosis present

## 2017-10-13 DIAGNOSIS — N189 Chronic kidney disease, unspecified: Secondary | ICD-10-CM | POA: Clinically undetermined

## 2017-10-13 DIAGNOSIS — N179 Acute kidney failure, unspecified: Secondary | ICD-10-CM | POA: Diagnosis present

## 2017-10-13 DIAGNOSIS — I129 Hypertensive chronic kidney disease with stage 1 through stage 4 chronic kidney disease, or unspecified chronic kidney disease: Secondary | ICD-10-CM | POA: Diagnosis not present

## 2017-10-13 DIAGNOSIS — R413 Other amnesia: Secondary | ICD-10-CM | POA: Diagnosis present

## 2017-10-13 DIAGNOSIS — J189 Pneumonia, unspecified organism: Principal | ICD-10-CM | POA: Diagnosis present

## 2017-10-13 DIAGNOSIS — Z6837 Body mass index (BMI) 37.0-37.9, adult: Secondary | ICD-10-CM

## 2017-10-13 DIAGNOSIS — R748 Abnormal levels of other serum enzymes: Secondary | ICD-10-CM | POA: Diagnosis not present

## 2017-10-13 DIAGNOSIS — R778 Other specified abnormalities of plasma proteins: Secondary | ICD-10-CM | POA: Diagnosis present

## 2017-10-13 DIAGNOSIS — E785 Hyperlipidemia, unspecified: Secondary | ICD-10-CM | POA: Diagnosis not present

## 2017-10-13 DIAGNOSIS — D473 Essential (hemorrhagic) thrombocythemia: Secondary | ICD-10-CM | POA: Diagnosis not present

## 2017-10-13 LAB — COMPREHENSIVE METABOLIC PANEL
ALBUMIN: 3.4 g/dL — AB (ref 3.5–5.0)
ALK PHOS: 114 U/L (ref 38–126)
ALT: 18 U/L (ref 14–54)
AST: 24 U/L (ref 15–41)
Anion gap: 13 (ref 5–15)
BILIRUBIN TOTAL: 0.9 mg/dL (ref 0.3–1.2)
BUN: 31 mg/dL — AB (ref 6–20)
CALCIUM: 9.2 mg/dL (ref 8.9–10.3)
CO2: 24 mmol/L (ref 22–32)
CREATININE: 1.8 mg/dL — AB (ref 0.44–1.00)
Chloride: 102 mmol/L (ref 101–111)
GFR calc Af Amer: 32 mL/min — ABNORMAL LOW (ref >60)
GFR, EST NON AFRICAN AMERICAN: 28 mL/min — AB (ref >60)
GLUCOSE: 97 mg/dL (ref 65–99)
Potassium: 3.6 mmol/L (ref 3.5–5.1)
Sodium: 139 mmol/L (ref 135–145)
TOTAL PROTEIN: 7.3 g/dL (ref 6.5–8.1)

## 2017-10-13 LAB — I-STAT CG4 LACTIC ACID, ED
LACTIC ACID, VENOUS: 1.27 mmol/L (ref 0.5–1.9)
Lactic Acid, Venous: 1.71 mmol/L (ref 0.5–1.9)

## 2017-10-13 LAB — URINALYSIS, ROUTINE W REFLEX MICROSCOPIC
BACTERIA UA: NONE SEEN
BILIRUBIN URINE: NEGATIVE
Glucose, UA: NEGATIVE mg/dL
HGB URINE DIPSTICK: NEGATIVE
Ketones, ur: NEGATIVE mg/dL
LEUKOCYTES UA: NEGATIVE
Nitrite: NEGATIVE
SQUAMOUS EPITHELIAL / LPF: NONE SEEN
Specific Gravity, Urine: 1.013 (ref 1.005–1.030)
pH: 5 (ref 5.0–8.0)

## 2017-10-13 LAB — CBC WITH DIFFERENTIAL/PLATELET
BASOS PCT: 1 %
Basophils Absolute: 0 10*3/uL (ref 0.0–0.1)
EOS ABS: 0.1 10*3/uL (ref 0.0–0.7)
Eosinophils Relative: 4 %
HCT: 30.8 % — ABNORMAL LOW (ref 36.0–46.0)
HEMOGLOBIN: 9.9 g/dL — AB (ref 12.0–15.0)
LYMPHS ABS: 1 10*3/uL (ref 0.7–4.0)
LYMPHS PCT: 60 %
MCH: 29.9 pg (ref 26.0–34.0)
MCHC: 32.1 g/dL (ref 30.0–36.0)
MCV: 93.1 fL (ref 78.0–100.0)
MONO ABS: 0 10*3/uL — AB (ref 0.1–1.0)
Monocytes Relative: 1 %
NEUTROS ABS: 0.6 10*3/uL — AB (ref 1.7–7.7)
Neutrophils Relative %: 34 %
Platelets: 466 10*3/uL — ABNORMAL HIGH (ref 150–400)
RBC: 3.31 MIL/uL — ABNORMAL LOW (ref 3.87–5.11)
RDW: 15.4 % (ref 11.5–15.5)
WBC: 1.7 10*3/uL — ABNORMAL LOW (ref 4.0–10.5)

## 2017-10-13 LAB — PROTIME-INR
INR: 1.07
PROTHROMBIN TIME: 13.8 s (ref 11.4–15.2)

## 2017-10-13 MED ORDER — VANCOMYCIN HCL IN DEXTROSE 1-5 GM/200ML-% IV SOLN
1000.0000 mg | Freq: Once | INTRAVENOUS | Status: AC
  Administered 2017-10-13: 1000 mg via INTRAVENOUS
  Filled 2017-10-13: qty 200

## 2017-10-13 MED ORDER — DIPHENHYDRAMINE HCL 50 MG/ML IJ SOLN
12.5000 mg | Freq: Once | INTRAMUSCULAR | Status: AC
Start: 2017-10-13 — End: 2017-10-13
  Administered 2017-10-13: 12.5 mg via INTRAVENOUS
  Filled 2017-10-13: qty 1

## 2017-10-13 MED ORDER — IPRATROPIUM-ALBUTEROL 0.5-2.5 (3) MG/3ML IN SOLN
3.0000 mL | Freq: Once | RESPIRATORY_TRACT | Status: AC
  Administered 2017-10-13: 3 mL via RESPIRATORY_TRACT
  Filled 2017-10-13: qty 3

## 2017-10-13 MED ORDER — PREDNISONE 20 MG PO TABS
60.0000 mg | ORAL_TABLET | Freq: Once | ORAL | Status: AC
  Administered 2017-10-13: 60 mg via ORAL
  Filled 2017-10-13: qty 3

## 2017-10-13 MED ORDER — DEXTROSE 5 % IV SOLN
2.0000 g | Freq: Once | INTRAVENOUS | Status: AC
  Administered 2017-10-13: 2 g via INTRAVENOUS
  Filled 2017-10-13: qty 2

## 2017-10-13 MED ORDER — SODIUM CHLORIDE 0.9 % IV BOLUS (SEPSIS)
1000.0000 mL | Freq: Once | INTRAVENOUS | Status: AC
  Administered 2017-10-13: 1000 mL via INTRAVENOUS

## 2017-10-13 NOTE — Progress Notes (Signed)
A consult was received from an ED physician for Vancomycin & Cefepime for HCAP per pharmacy dosing.  The patient's profile has been reviewed for ht/wt/allergies/indication/available labs.   A one time order has been placed for Vancomycin 1gm & Cefepime 2gm.  Further antibiotics/pharmacy consults should be ordered by admitting physician if indicated.                       Thank you,  Minda Ditto 10/13/2017  9:09 PM

## 2017-10-13 NOTE — H&P (Addendum)
TRH H&P   Patient Demographics:    Candace Long, is a 71 y.o. female  MRN: [id number]   DOB - 01-Apr-1946  Admit Date - 10/13/2017  Outpatient Primary MD for the patient is Ria Bush, MD  Referring MD/NP/PA: Marye Round  Outpatient Specialists:   Patient coming from: home  Chief Complaint  Patient presents with  . Altered Mental Status  . Shortness of Breath  . possible uti      HPI:    Candace Long  is a 71 y.o. female, w  Dm2, hypertension, hyperlipidemia, ckd stage 3, cerebral aneurysm repair,  C/o feeling poorly. + wheezing, slight confusion.  Slight cough , sob.    Denies fever, chills, cp, palp , n/v, diarrhea, brbpr.  Pt does note some slight cp under her neck with cough.    In ED,  Wbc 1.7, Hgb 9.9, Plt 466 Bun 31, Creatinine 1.80 Na 139, K 3.6   CXR  IMPRESSION: 1. Patchy, hazy airspace opacities in the right lung are suggestive of pneumonia. 2. Cardiomegaly.  Pt will be admitted for pneumonia, HCAP and allerghic reaction to cefepime, and also ARF.    Review of systems:    In addition to the HPI above,  No Fever-chills No Headache, No changes with Vision or hearing, No problems swallowing food or Liquids, No Chest pain,   No Abdominal pain, No Nausea or Vommitting, Bowel movements are regular, No Blood in stool or Urine, No dysuria, No new skin rashes or bruises, No new joints pains-aches,  No new weakness, tingling, numbness in any extremity, No recent weight gain or loss, No polyuria, polydypsia or polyphagia, No significant Mental Stressors.  A full 10 point Review of Systems was done, except as stated above, all other Review of Systems were negative.   With Past History of the following :    Past Medical History:  Diagnosis Date  . Chronic kidney disease, stage III (moderate) ([place]) 05/24/2014  . HLD (hyperlipidemia)   .  Hypertension   . Obesity   . S/P cerebral aneurysm repair 1993   some residual memory impairment  . Uncontrolled type 2 diabetes mellitus with nephropathy [facility])    DSME [facility] 06/2014, did not keep f/u classes so discharged      Past Surgical History:  Procedure Laterality Date  . CARDIOVASCULAR STRESS TEST  12/2012   WNL EF 55% (Harwani)  . COLONOSCOPY  07/2014   1 tubular adenoma, severe diverticulosis, rpt 5 yrs Henrene Pastor)  . CRANIOTOMY  1993   cerebral aneurysm repair  . RIGHT/LEFT HEART CATH AND CORONARY ANGIOGRAPHY N/A 08/22/2017   Procedure: RIGHT/LEFT HEART CATH AND CORONARY ANGIOGRAPHY;  Surgeon: Lorretta Harp, MD;  Location: [place] CV LAB;  Service: Cardiovascular;  Laterality: N/A;  . VENTRICULOPERITONEAL SHUNT  1993      Social History:     Social History  Substance Use  Topics  . Smoking status: Former Smoker    Packs/day: 1.00    Types: Cigarettes    Quit date: 12/13/2008  . Smokeless tobacco: Former Systems developer    Types: Snuff    Quit date: 12/13/2010  . Alcohol use No     Lives - at home  Mobility - walks by self   Family History :     Family History  Problem Relation Age of Onset  . Hypertension Mother   . Diabetes Mother   . Diabetes Sister   . Heart failure Sister   . CAD Neg Hx   . Stroke Neg Hx   . Cancer Neg Hx   . Colon cancer Neg Hx   . Esophageal cancer Neg Hx   . Rectal cancer Neg Hx   . Stomach cancer Neg Hx       Home Medications:   Prior to Admission medications   Medication Sig Start Date End Date Taking? Authorizing Provider  albuterol (VENTOLIN HFA) 108 (90 Base) MCG/ACT inhaler Inhale 2 puffs into the lungs every 6 (six) hours as needed for wheezing or shortness of breath. 12/27/15  Yes Ria Bush, MD  amLODipine (NORVASC) 10 MG tablet TAKE 1 TABLET (10 MG TOTAL) BY MOUTH DAILY. 06/01/17  Yes Ria Bush, MD  aspirin EC 81 MG tablet Take 81 mg by mouth daily.    Yes [provider]  atorvastatin (LIPITOR) 40  MG tablet TAKE 1 TABLET (40 MG TOTAL) BY MOUTH DAILY. 06/01/17  Yes Ria Bush, MD  carvedilol (COREG) 3.125 MG tablet Take 1 tablet (3.125 mg total) by mouth 2 (two) times daily. 10/10/17 01/08/18 Yes Kilroy, Doreene Burke, PA-C  Cholecalciferol (VITAMIN D) 1000 UNITS capsule Take 1 capsule (1,000 Units total) by mouth daily. 05/27/14  Yes Ria Bush, MD  furosemide (LASIX) 40 MG tablet 2 tablets AM and 1 tablet PM mon, wed and Friday one tab AM and one tab PM other days 10/10/17  Yes Kilroy, Lurena Joiner K, PA-C  hydrALAZINE (APRESOLINE) 25 MG tablet Take 1 tablet (25 mg total) by mouth every 8 (eight) hours. 08/24/17  Yes Dunn, Dayna N, PA-C  isosorbide mononitrate (IMDUR) 30 MG 24 hr tablet Take 1 tablet (30 mg total) by mouth daily. 08/24/17  Yes Dunn, Dayna N, PA-C  LANTUS SOLOSTAR 100 UNIT/ML Solostar Pen INJECT 30 UNITS INTO THE SKIN DAILY. Patient taking differently: Inject 30 units into the skin every morning 02/18/17  Yes Ria Bush, MD  magnesium oxide (MAG-OX) 400 (241.3 Mg) MG tablet Take 1 tablet (400 mg total) by mouth 2 (two) times daily. 08/24/17  Yes Dunn, Dayna N, PA-C  metFORMIN (GLUCOPHAGE) 1000 MG tablet TAKE 1 TABLET BY MOUTH 2 TIMES DAILY WITH A MEAL. 08/29/17  Yes Ria Bush, MD  potassium chloride SA (KLOR-CON M20) 20 MEQ tablet Take 2 tablets (40 mEq total) by mouth 2 (two) times daily. Patient taking differently: Take 20 mEq by mouth 4 (four) times daily.  08/24/17  Yes Dunn, Dayna N, PA-C  vitamin B-12 (CYANOCOBALAMIN) 1000 MCG tablet Take 1 tablet (1,000 mcg total) by mouth daily. 05/28/15  Yes Ria Bush, MD  B-D ULTRAFINE III SHORT PEN 31G X 8 MM MISC USE TO INJECT LANTUS ONCE DAILY. 07/05/17   Ria Bush, MD     Allergies:     Allergies  Allergen Reactions  . Cefepime Hives  . Hydrochlorothiazide Other (See Comments)    Hypokalemia, hyponatremia     Physical Exam:   Vitals  Blood pressure Marland Kitchen)  142/86, pulse (!) 53, temperature (!) 100.6 F  (38.1 C), temperature source Oral, resp. rate (!) 26, SpO2 91 %.   1. General  lying in bed in NAD,     2. Normal affect and insight, Not Suicidal or Homicidal, Awake Alert, Oriented X 3.  3. No F.N deficits, ALL C.Nerves Intact, Strength 5/5 all 4 extremities, Sensation intact all 4 extremities, Plantars down going.  4. Ears and Eyes appear Normal, Conjunctivae clear, PERRLA. Moist Oral Mucosa.  5. Supple Neck, No JVD, No cervical lymphadenopathy appriciated, No Carotid Bruits.  6. Symmetrical Chest wall movement, Good air movement bilaterally,    7. RRR, S1, S2, 2/6 sem apex  8. Positive Bowel Sounds, Abdomen Soft, No tenderness, No organomegaly appriciated,No rebound -guarding or rigidity.  9.  No Cyanosis, Normal Skin Turgor, No Skin Rash or Bruise.  10. Good muscle tone,  joints appear normal , no effusions, Normal ROM.  11. No Palpable Lymph Nodes in Neck or Axillae     Data Review:    CBC  Recent Labs Lab 10/13/17 2110  WBC 1.7*  HGB 9.9*  HCT 30.8*  PLT 466*  MCV 93.1  MCH 29.9  MCHC 32.1  RDW 15.4  LYMPHSABS 1.0  MONOABS 0.0*  EOSABS 0.1  BASOSABS 0.0   ------------------------------------------------------------------------------------------------------------------  Chemistries   Recent Labs Lab 10/10/17 1514 10/13/17 2110  NA 141 139  K 4.2 3.6  CL 97 102  CO2 26 24  GLUCOSE 99 97  BUN 22 31*  CREATININE 1.47* 1.80*  CALCIUM 9.7 9.2  AST  --  24  ALT  --  18  ALKPHOS  --  114  BILITOT  --  0.9   ------------------------------------------------------------------------------------------------------------------ estimated creatinine clearance is 38 mL/min (A) (by C-G formula based on SCr of 1.8 mg/dL (H)). ------------------------------------------------------------------------------------------------------------------ No results for input(s): TSH, T4TOTAL, T3FREE, THYROIDAB in the last 72 hours.  Invalid input(s): FREET3  Coagulation  profile  Recent Labs Lab 10/13/17 2110  INR 1.07   ------------------------------------------------------------------------------------------------------------------- No results for input(s): DDIMER in the last 72 hours. -------------------------------------------------------------------------------------------------------------------  Cardiac Enzymes No results for input(s): CKMB, TROPONINI, MYOGLOBIN in the last 168 hours.  Invalid input(s): CK ------------------------------------------------------------------------------------------------------------------    Component Value Date/Time   BNP 566.9 (H) 08/19/2017 1305     ---------------------------------------------------------------------------------------------------------------  Urinalysis    Component Value Date/Time   COLORURINE YELLOW 10/13/2017 2254   APPEARANCEUR CLEAR 10/13/2017 2254   LABSPEC 1.013 10/13/2017 2254   PHURINE 5.0 10/13/2017 2254   GLUCOSEU NEGATIVE 10/13/2017 2254   HGBUR NEGATIVE 10/13/2017 2254   BILIRUBINUR NEGATIVE 10/13/2017 2254   BILIRUBINUR negative 04/04/2016 [place] 10/13/2017 2254   PROTEINUR >=300 (A) 10/13/2017 2254   UROBILINOGEN 0.2 04/04/2016 1131   UROBILINOGEN 0.2 12/21/2011 0951   NITRITE NEGATIVE 10/13/2017 2254   LEUKOCYTESUR NEGATIVE 10/13/2017 2254    ----------------------------------------------------------------------------------------------------------------   Imaging Results:    Dg Chest 2 View  Result Date: 10/13/2017 CLINICAL DATA:  Shortness of breath and confusion, possible sepsis EXAM: CHEST  2 VIEW COMPARISON:  Chest radiograph 08/19/2017 FINDINGS: There is a catheter coursing along the right chest, possibly a ventriculostomy catheter. The heart is enlarged. There are patchy hazy opacities in the right lung. The left lung is clear. No pneumothorax or pleural effusion. IMPRESSION: 1. Patchy, hazy airspace opacities in the right lung are  suggestive of pneumonia. 2. Cardiomegaly. Electronically Signed   By: Ulyses Jarred M.D.   On: 10/13/2017 22:39   nsr at 90 LAD,  poor R progression     Assessment & Plan:    Principal Problem:   Healthcare-associated pneumonia Active Problems:   Tachycardia   ARF (acute renal failure) (HCC)    Hcap Blood culture x2 Sputum culture Urine strep antigen, urine legionella antigen vanco iv, levaquin iv pharmacy to dose  Tachycardia Tele Tsh, trop I q6h x3 D dimer If d dimer is positive then VQ scan  ARF Check urine sodium, urine creatinine, urine eosinophils Check renal ultrasound Ns iv HOLD Lasix  Dm2 fsbs ac and qhs, ISS STOP METFORMIN due to ARF  CM (EF 40%)  Hypertension Cont carvedilol Cont hydralazine Cont lasix  Hyperlipidemia Cont lipitor   DVT Prophylaxis Lovenox - SCDs   AM Labs Ordered, also please review Full Orders  Family Communication: Admission, patients condition and plan of care including tests being ordered have been discussed with the patient  who indicate understanding and agree with the plan and Code Status.  Code Status FULL CODE  Likely DC to  home  Condition GUARDED   Consults called: none  Admission status: inpatient   Time spent in minutes : 45   Jani Gravel M.D on 10/13/2017 at 11:19 PM  Between 7am to 7pm - Pager - [phone] . After 7pm go to [email or web address] - password [facility]  [facility] - Office  [phone]

## 2017-10-13 NOTE — ED Notes (Signed)
Bed: WA20 Expected date:  Expected time:  Means of arrival:  Comments: EMS 71 yo female from home confused/resp distress-possible UTI

## 2017-10-13 NOTE — ED Provider Notes (Signed)
[place] DEPT Provider Note   CSN: [id number] Arrival date & time: 10/13/17  2023     History   Chief Complaint Chief Complaint  Patient presents with  . Altered Mental Status  . Shortness of Breath  . possible uti    HPI Candace Long is a 71 y.o. female.  HPI Patient presents to the emergency room for evaluation of shortness of breath and cough.  According to the nursing report, the patient's also had trouble with confusion over the last several weeks.  Patient lives at home.  She has noticed cough and congestion over the last several days.  It got worse this evening.  She had a low-grade temperature up to 100.6.  Patient denies any abdominal pain.  No headache.  No rashes.  No chest pain.  No leg swelling. Past Medical History:  Diagnosis Date  . Chronic kidney disease, stage III (moderate) ([place]) 05/24/2014  . HLD (hyperlipidemia)   . Hypertension   . Obesity   . S/P cerebral aneurysm repair 1993   some residual memory impairment  . Uncontrolled type 2 diabetes mellitus with nephropathy [facility])    DSME [facility] 06/2014, did not keep f/u classes so discharged    Patient Active Problem List   Diagnosis Date Noted  . NICM (nonischemic cardiomyopathy) ([place]) 08/24/2017  . Elevated troponin 08/22/2017  . Acute systolic CHF (congestive heart failure) ([place]) 08/22/2017  . NSVT (nonsustained ventricular tachycardia) ([place]) 08/22/2017  . Atrial tachycardia ([place]) 08/22/2017  . Hypomagnesemia 08/22/2017  . Hypokalemia 08/22/2017  . Ex-smoker 08/17/2017  . 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 Normocytic anemia 09/29/2016  . Abnormal SPEP 09/29/2016  . Anemia 09/05/2016  . Hyponatremia 06/20/2016  . 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 Hearing impaired 06/10/2016  . Polyuria 06/10/2016  . Thyroid nodule 09/24/2015  . Vitamin D deficiency 09/04/2015  . Vitamin B12 deficiency 09/04/2015  . Dyspnea 09/04/2015  . Thrombocytosis  ([place]) 06/10/2015  . Advanced care planning/counseling discussion 05/27/2015  . Medicare annual wellness visit, subsequent 05/24/2014  . MCI (mild cognitive impairment) with memory loss 05/24/2014  . Chronic kidney disease, stage III (moderate) ([place]) 05/24/2014  . Hypertension   . Controlled type 2 diabetes mellitus with diabetic nephropathy ([place])   . HLD (hyperlipidemia)   . Severe obesity (BMI 35.0-39.9) with comorbidity [facility])     Past Surgical History:  Procedure Laterality Date  . CARDIOVASCULAR STRESS TEST  12/2012   WNL EF 55% (Harwani)  . COLONOSCOPY  07/2014   1 tubular adenoma, severe diverticulosis, rpt 5 yrs Henrene Pastor)  . CRANIOTOMY  1993   cerebral aneurysm repair  . RIGHT/LEFT HEART CATH AND CORONARY ANGIOGRAPHY N/A 08/22/2017   Procedure: RIGHT/LEFT HEART CATH AND CORONARY ANGIOGRAPHY;  Surgeon: Lorretta Harp, MD;  Location: [place] CV LAB;  Service: Cardiovascular;  Laterality: N/A;  . VENTRICULOPERITONEAL SHUNT  1993    OB History    No data available       Home Medications    Prior to Admission medications   Medication Sig Start Date End Date Taking? Authorizing Provider  albuterol (VENTOLIN HFA) 108 (90 Base) MCG/ACT inhaler Inhale 2 puffs into the lungs every 6 (six) hours as needed for wheezing or shortness of breath. 12/27/15  Yes Ria Bush, MD  amLODipine (NORVASC) 10 MG tablet TAKE 1 TABLET (10 MG TOTAL) BY MOUTH DAILY. 06/01/17  Yes Ria Bush, MD  aspirin EC 81 MG tablet Take 81 mg by mouth daily.  Yes [provider]  atorvastatin (LIPITOR) 40 MG tablet TAKE 1 TABLET (40 MG TOTAL) BY MOUTH DAILY. 06/01/17  Yes Ria Bush, MD  carvedilol (COREG) 3.125 MG tablet Take 1 tablet (3.125 mg total) by mouth 2 (two) times daily. 10/10/17 01/08/18 Yes Kilroy, Doreene Burke, PA-C  Cholecalciferol (VITAMIN D) 1000 UNITS capsule Take 1 capsule (1,000 Units total) by mouth daily. 05/27/14  Yes Ria Bush, MD  furosemide (LASIX) 40 MG  tablet 2 tablets AM and 1 tablet PM mon, wed and Friday one tab AM and one tab PM other days 10/10/17  Yes Kilroy, Lurena Joiner K, PA-C  hydrALAZINE (APRESOLINE) 25 MG tablet Take 1 tablet (25 mg total) by mouth every 8 (eight) hours. 08/24/17  Yes Dunn, Dayna N, PA-C  isosorbide mononitrate (IMDUR) 30 MG 24 hr tablet Take 1 tablet (30 mg total) by mouth daily. 08/24/17  Yes Dunn, Dayna N, PA-C  LANTUS SOLOSTAR 100 UNIT/ML Solostar Pen INJECT 30 UNITS INTO THE SKIN DAILY. Patient taking differently: Inject 30 units into the skin every morning 02/18/17  Yes Ria Bush, MD  magnesium oxide (MAG-OX) 400 (241.3 Mg) MG tablet Take 1 tablet (400 mg total) by mouth 2 (two) times daily. 08/24/17  Yes Dunn, Dayna N, PA-C  metFORMIN (GLUCOPHAGE) 1000 MG tablet TAKE 1 TABLET BY MOUTH 2 TIMES DAILY WITH A MEAL. 08/29/17  Yes Ria Bush, MD  potassium chloride SA (KLOR-CON M20) 20 MEQ tablet Take 2 tablets (40 mEq total) by mouth 2 (two) times daily. Patient taking differently: Take 20 mEq by mouth 4 (four) times daily.  08/24/17  Yes Dunn, Dayna N, PA-C  vitamin B-12 (CYANOCOBALAMIN) 1000 MCG tablet Take 1 tablet (1,000 mcg total) by mouth daily. 05/28/15  Yes Ria Bush, MD  B-D ULTRAFINE III SHORT PEN 31G X 8 MM MISC USE TO INJECT LANTUS ONCE DAILY. 07/05/17   Ria Bush, MD    Family History Family History  Problem Relation Age of Onset  . Hypertension Mother   . Diabetes Mother   . Diabetes Sister   . Heart failure Sister   . CAD Neg Hx   . Stroke Neg Hx   . Cancer Neg Hx   . Colon cancer Neg Hx   . Esophageal cancer Neg Hx   . Rectal cancer Neg Hx   . Stomach cancer Neg Hx     Social History Social History  Substance Use Topics  . Smoking status: Former Smoker    Packs/day: 1.00    Types: Cigarettes    Quit date: 12/13/2008  . Smokeless tobacco: Former Systems developer    Types: Snuff    Quit date: 12/13/2010  . Alcohol use No     Allergies   Cefepime and  Hydrochlorothiazide   Review of Systems Review of Systems  All other systems reviewed and are negative.    Physical Exam Updated Vital Signs BP (!) 142/86   Pulse (!) 53   Temp (!) 100.6 F (38.1 C) (Oral)   Resp (!) 26   SpO2 91%   Physical Exam  Constitutional: No distress.  HENT:  Head: Normocephalic and atraumatic.  Right Ear: External ear normal.  Left Ear: External ear normal.  Eyes: Conjunctivae are normal. Right eye exhibits no discharge. Left eye exhibits no discharge. No scleral icterus.  Neck: Neck supple. No tracheal deviation present.  Cardiovascular: Normal rate, regular rhythm and intact distal pulses.   Pulmonary/Chest: Effort normal. No stridor. Tachypnea noted. No respiratory distress. She has wheezes. She has  no rales.  Abdominal: Soft. Bowel sounds are normal. She exhibits no distension. There is no tenderness. There is no rebound and no guarding.  Musculoskeletal: She exhibits no edema or tenderness.  Neurological: She is alert. She has normal strength. No cranial nerve deficit (no facial droop, extraocular movements intact, no slurred speech) or sensory deficit. She exhibits normal muscle tone. She displays no seizure activity. Coordination normal.  Patient is alert and answering questions appropriately  Skin: Skin is warm and dry. No rash noted.  Psychiatric: She has a normal mood and affect.  Nursing note and vitals reviewed.    ED Treatments / Results  Labs (all labs ordered are listed, but only abnormal results are displayed) Labs Reviewed  COMPREHENSIVE METABOLIC PANEL - Abnormal; Notable for the following:       Result Value   BUN 31 (*)    Creatinine, Ser 1.80 (*)    Albumin 3.4 (*)    GFR calc non Af Amer 28 (*)    GFR calc Af Amer 32 (*)    All other components within normal limits  CBC WITH DIFFERENTIAL/PLATELET - Abnormal; Notable for the following:    WBC 1.7 (*)    RBC 3.31 (*)    Hemoglobin 9.9 (*)    HCT 30.8 (*)    Platelets  466 (*)    Neutro Abs 0.6 (*)    Monocytes Absolute 0.0 (*)    All other components within normal limits  CULTURE, BLOOD (ROUTINE X 2)  CULTURE, BLOOD (ROUTINE X 2)  PROTIME-INR  URINALYSIS, ROUTINE W REFLEX MICROSCOPIC  PATHOLOGIST SMEAR REVIEW  INFLUENZA PANEL BY PCR (TYPE A & B)  I-STAT CG4 LACTIC ACID, ED  I-STAT CG4 LACTIC ACID, ED     Radiology Dg Chest 2 View  Result Date: 10/13/2017 CLINICAL DATA:  Shortness of breath and confusion, possible sepsis EXAM: CHEST  2 VIEW COMPARISON:  Chest radiograph 08/19/2017 FINDINGS: There is a catheter coursing along the right chest, possibly a ventriculostomy catheter. The heart is enlarged. There are patchy hazy opacities in the right lung. The left lung is clear. No pneumothorax or pleural effusion. IMPRESSION: 1. Patchy, hazy airspace opacities in the right lung are suggestive of pneumonia. 2. Cardiomegaly. Electronically Signed   By: Ulyses Jarred M.D.   On: 10/13/2017 22:39    Procedures Procedures (including critical care time)  Medications Ordered in ED Medications  vancomycin (VANCOCIN) IVPB 1000 mg/200 mL premix (1,000 mg Intravenous New Bag/Given 10/13/17 2230)  ipratropium-albuterol (DUONEB) 0.5-2.5 (3) MG/3ML nebulizer solution 3 mL (not administered)  predniSONE (DELTASONE) tablet 60 mg (not administered)  sodium chloride 0.9 % bolus 1,000 mL (0 mLs Intravenous Stopped 10/13/17 2242)  ceFEPIme (MAXIPIME) 2 g in dextrose 5 % 50 mL IVPB (0 g Intravenous Stopped 10/13/17 2155)  diphenhydrAMINE (BENADRYL) injection 12.5 mg (12.5 mg Intravenous Given 10/13/17 2200)     Initial Impression / Assessment and Plan / ED Course  I have reviewed the triage vital signs and the nursing notes.  Pertinent labs & imaging results that were available during my care of the patient were reviewed by me and considered in my medical decision making (see chart for details).  Clinical Course as of Oct 13 2312  Thu Oct 13, 2017  2106 Pt presents  with cough and fever.  Clearly wheezing on exam.    Low grade temperature.  Blood pressure is stable.  Pt is tachypneic.  Hr documented as 27.  I don't think that is accurate.  97 on the cardiac monitor.  [JK]  2158 Pt developed hives after cefipime.  She is asymptomatic otherwise.   Will monitor closely.  Not give additional doses.   [JK]    Clinical Course User Index [JK] Dorie Rank, MD   Patient presented to the emergency room with cough and shortness of breath.  She was tachypneic and febrile.  Sepsis protocol started.  Abx initiated.  BP remained stable.  Labs notable for decreased WBC.  Probable pna on cxr.  Will admit for further treatment  Final Clinical Impressions(s) / ED Diagnoses   Final diagnoses:  HCAP (healthcare-associated pneumonia)  Leukopenia, unspecified type      Dorie Rank, MD 10/13/17 2313

## 2017-10-13 NOTE — ED Notes (Addendum)
Pt. had allergic reaction to cefepime IV , hives on face noted, MD notified at ta bedside with Charge Nurse Teri,RN. Medicated with benadryl IV. Vancomycin IV held until benadryl relieve allergic reaction.

## 2017-10-13 NOTE — ED Triage Notes (Signed)
Per EMS , pt. From home with complaint of  SOB which started this evening, confusion noted over 3 weeks and possible UTI. Per family , has been altered over 3 weeks but getting worst this evening, denied of fever. Pt. Was on room air but placed on 4L/min O2NC . Received 1 dose breathing tx via EMS.

## 2017-10-14 ENCOUNTER — Inpatient Hospital Stay (HOSPITAL_COMMUNITY): Payer: Medicare Other

## 2017-10-14 LAB — CBC WITH DIFFERENTIAL/PLATELET
BASOS PCT: 1 %
Basophils Absolute: 0 10*3/uL (ref 0.0–0.1)
Eosinophils Absolute: 0 10*3/uL (ref 0.0–0.7)
Eosinophils Relative: 1 %
HEMATOCRIT: 29.9 % — AB (ref 36.0–46.0)
HEMOGLOBIN: 9.4 g/dL — AB (ref 12.0–15.0)
LYMPHS ABS: 0.7 10*3/uL (ref 0.7–4.0)
LYMPHS PCT: 49 %
MCH: 29.7 pg (ref 26.0–34.0)
MCHC: 31.4 g/dL (ref 30.0–36.0)
MCV: 94.6 fL (ref 78.0–100.0)
MONOS PCT: 1 %
Monocytes Absolute: 0 10*3/uL — ABNORMAL LOW (ref 0.1–1.0)
NEUTROS ABS: 0.8 10*3/uL — AB (ref 1.7–7.7)
Neutrophils Relative %: 48 %
Platelets: 439 10*3/uL — ABNORMAL HIGH (ref 150–400)
RBC: 3.16 MIL/uL — ABNORMAL LOW (ref 3.87–5.11)
RDW: 15.6 % — ABNORMAL HIGH (ref 11.5–15.5)
WBC: 1.5 10*3/uL — ABNORMAL LOW (ref 4.0–10.5)

## 2017-10-14 LAB — BASIC METABOLIC PANEL WITH GFR
Anion gap: 12 (ref 5–15)
BUN: 38 mg/dL — ABNORMAL HIGH (ref 6–20)
CO2: 18 mmol/L — ABNORMAL LOW (ref 22–32)
Calcium: 8.4 mg/dL — ABNORMAL LOW (ref 8.9–10.3)
Chloride: 103 mmol/L (ref 101–111)
Creatinine, Ser: 2 mg/dL — ABNORMAL HIGH (ref 0.44–1.00)
GFR calc Af Amer: 28 mL/min — ABNORMAL LOW
GFR calc non Af Amer: 24 mL/min — ABNORMAL LOW
Glucose, Bld: 295 mg/dL — ABNORMAL HIGH (ref 65–99)
Potassium: 4.6 mmol/L (ref 3.5–5.1)
Sodium: 133 mmol/L — ABNORMAL LOW (ref 135–145)

## 2017-10-14 LAB — GLUCOSE, CAPILLARY
GLUCOSE-CAPILLARY: 308 mg/dL — AB (ref 65–99)
Glucose-Capillary: 188 mg/dL — ABNORMAL HIGH (ref 65–99)
Glucose-Capillary: 267 mg/dL — ABNORMAL HIGH (ref 65–99)
Glucose-Capillary: 324 mg/dL — ABNORMAL HIGH (ref 65–99)

## 2017-10-14 LAB — MAGNESIUM: Magnesium: 1.5 mg/dL — ABNORMAL LOW (ref 1.7–2.4)

## 2017-10-14 LAB — SODIUM, URINE, RANDOM

## 2017-10-14 LAB — HEMOGLOBIN A1C
Hgb A1c MFr Bld: 6.3 % — ABNORMAL HIGH (ref 4.8–5.6)
MEAN PLASMA GLUCOSE: 134.11 mg/dL

## 2017-10-14 LAB — INFLUENZA PANEL BY PCR (TYPE A & B)
Influenza A By PCR: NEGATIVE
Influenza B By PCR: NEGATIVE

## 2017-10-14 LAB — TROPONIN I
TROPONIN I: 0.09 ng/mL — AB (ref <0.03)
Troponin I: 0.13 ng/mL (ref <0.03)
Troponin I: 0.08 ng/mL (ref <0.03)

## 2017-10-14 LAB — PATHOLOGIST SMEAR REVIEW

## 2017-10-14 LAB — D-DIMER, QUANTITATIVE (NOT AT ARMC): D DIMER QUANT: 3.03 ug{FEU}/mL — AB (ref 0.00–0.50)

## 2017-10-14 LAB — HIV ANTIBODY (ROUTINE TESTING W REFLEX): HIV Screen 4th Generation wRfx: NONREACTIVE

## 2017-10-14 MED ORDER — LEVOFLOXACIN IN D5W 750 MG/150ML IV SOLN
750.0000 mg | INTRAVENOUS | Status: DC
  Administered 2017-10-14 – 2017-10-16 (×2): 750 mg via INTRAVENOUS
  Filled 2017-10-14 (×2): qty 150

## 2017-10-14 MED ORDER — INSULIN GLARGINE 100 UNIT/ML ~~LOC~~ SOLN
10.0000 [IU] | Freq: Every day | SUBCUTANEOUS | Status: DC
  Administered 2017-10-14: 10 [IU] via SUBCUTANEOUS
  Filled 2017-10-14 (×2): qty 0.1

## 2017-10-14 MED ORDER — INSULIN ASPART 100 UNIT/ML ~~LOC~~ SOLN
0.0000 [IU] | Freq: Three times a day (TID) | SUBCUTANEOUS | Status: DC
  Administered 2017-10-15: 4 [IU] via SUBCUTANEOUS
  Administered 2017-10-15 (×2): 11 [IU] via SUBCUTANEOUS
  Administered 2017-10-16: 7 [IU] via SUBCUTANEOUS
  Administered 2017-10-16: 11 [IU] via SUBCUTANEOUS
  Administered 2017-10-16: 7 [IU] via SUBCUTANEOUS
  Administered 2017-10-17: 4 [IU] via SUBCUTANEOUS
  Administered 2017-10-17 (×2): 11 [IU] via SUBCUTANEOUS
  Administered 2017-10-18: 7 [IU] via SUBCUTANEOUS
  Administered 2017-10-18 – 2017-10-19 (×3): 4 [IU] via SUBCUTANEOUS
  Administered 2017-10-19 (×2): 3 [IU] via SUBCUTANEOUS
  Administered 2017-10-20: 4 [IU] via SUBCUTANEOUS
  Administered 2017-10-21: 11 [IU] via SUBCUTANEOUS

## 2017-10-14 MED ORDER — ALBUTEROL SULFATE (2.5 MG/3ML) 0.083% IN NEBU
2.5000 mg | INHALATION_SOLUTION | Freq: Four times a day (QID) | RESPIRATORY_TRACT | Status: DC
  Administered 2017-10-14 – 2017-10-15 (×5): 2.5 mg via RESPIRATORY_TRACT
  Filled 2017-10-14 (×6): qty 3

## 2017-10-14 MED ORDER — INSULIN ASPART 100 UNIT/ML ~~LOC~~ SOLN
0.0000 [IU] | Freq: Every day | SUBCUTANEOUS | Status: DC
  Administered 2017-10-14: 4 [IU] via SUBCUTANEOUS
  Administered 2017-10-15: 2 [IU] via SUBCUTANEOUS
  Administered 2017-10-16: 4 [IU] via SUBCUTANEOUS
  Administered 2017-10-17: 3 [IU] via SUBCUTANEOUS

## 2017-10-14 MED ORDER — VANCOMYCIN HCL IN DEXTROSE 750-5 MG/150ML-% IV SOLN
750.0000 mg | INTRAVENOUS | Status: DC
  Administered 2017-10-14: 750 mg via INTRAVENOUS
  Filled 2017-10-14: qty 150

## 2017-10-14 MED ORDER — METHYLPREDNISOLONE SODIUM SUCC 125 MG IJ SOLR
80.0000 mg | Freq: Three times a day (TID) | INTRAMUSCULAR | Status: AC
  Administered 2017-10-14 (×2): 80 mg via INTRAVENOUS
  Filled 2017-10-14 (×2): qty 2

## 2017-10-14 MED ORDER — VANCOMYCIN HCL IN DEXTROSE 1-5 GM/200ML-% IV SOLN
1000.0000 mg | INTRAVENOUS | Status: AC
  Administered 2017-10-14: 1000 mg via INTRAVENOUS
  Filled 2017-10-14: qty 200

## 2017-10-14 MED ORDER — INSULIN GLARGINE 100 UNIT/ML ~~LOC~~ SOLN
30.0000 [IU] | Freq: Every day | SUBCUTANEOUS | Status: DC
  Filled 2017-10-14: qty 0.3

## 2017-10-14 MED ORDER — HYDRALAZINE HCL 25 MG PO TABS
25.0000 mg | ORAL_TABLET | Freq: Three times a day (TID) | ORAL | Status: DC
  Administered 2017-10-14 – 2017-10-16 (×6): 25 mg via ORAL
  Filled 2017-10-14 (×6): qty 1

## 2017-10-14 MED ORDER — TECHNETIUM TC 99M DIETHYLENETRIAME-PENTAACETIC ACID
30.0000 | Freq: Once | INTRAVENOUS | Status: AC | PRN
  Administered 2017-10-14: 30 via INTRAVENOUS

## 2017-10-14 MED ORDER — ISOSORBIDE MONONITRATE ER 30 MG PO TB24
30.0000 mg | ORAL_TABLET | Freq: Every day | ORAL | Status: DC
  Administered 2017-10-14 – 2017-10-21 (×8): 30 mg via ORAL
  Filled 2017-10-14 (×8): qty 1

## 2017-10-14 MED ORDER — ATORVASTATIN CALCIUM 40 MG PO TABS
40.0000 mg | ORAL_TABLET | Freq: Every day | ORAL | Status: DC
  Administered 2017-10-14 – 2017-10-20 (×7): 40 mg via ORAL
  Filled 2017-10-14 (×7): qty 1

## 2017-10-14 MED ORDER — AMLODIPINE BESYLATE 10 MG PO TABS
10.0000 mg | ORAL_TABLET | Freq: Every day | ORAL | Status: DC
  Administered 2017-10-14 – 2017-10-16 (×3): 10 mg via ORAL
  Filled 2017-10-14 (×3): qty 1

## 2017-10-14 MED ORDER — ASPIRIN EC 81 MG PO TBEC
81.0000 mg | DELAYED_RELEASE_TABLET | Freq: Every day | ORAL | Status: DC
  Administered 2017-10-14 – 2017-10-21 (×8): 81 mg via ORAL
  Filled 2017-10-14 (×8): qty 1

## 2017-10-14 MED ORDER — ENOXAPARIN SODIUM 30 MG/0.3ML ~~LOC~~ SOLN
30.0000 mg | SUBCUTANEOUS | Status: DC
  Administered 2017-10-14 – 2017-10-21 (×8): 30 mg via SUBCUTANEOUS
  Filled 2017-10-14 (×8): qty 0.3

## 2017-10-14 MED ORDER — MAGNESIUM SULFATE 4 GM/100ML IV SOLN
4.0000 g | Freq: Once | INTRAVENOUS | Status: AC
  Administered 2017-10-14: 4 g via INTRAVENOUS
  Filled 2017-10-14: qty 100

## 2017-10-14 MED ORDER — MAGNESIUM OXIDE 400 (241.3 MG) MG PO TABS
400.0000 mg | ORAL_TABLET | Freq: Two times a day (BID) | ORAL | Status: DC
  Administered 2017-10-14 – 2017-10-21 (×16): 400 mg via ORAL
  Filled 2017-10-14 (×16): qty 1

## 2017-10-14 MED ORDER — ALBUTEROL SULFATE (2.5 MG/3ML) 0.083% IN NEBU: 3.0000 mL | INHALATION_SOLUTION | Freq: Four times a day (QID) | RESPIRATORY_TRACT | Status: DC | PRN

## 2017-10-14 MED ORDER — SODIUM CHLORIDE 0.9 % IV SOLN
INTRAVENOUS | Status: AC
  Administered 2017-10-14: 01:00:00 via INTRAVENOUS

## 2017-10-14 MED ORDER — CARVEDILOL 3.125 MG PO TABS
3.1250 mg | ORAL_TABLET | Freq: Two times a day (BID) | ORAL | Status: DC
  Administered 2017-10-14 – 2017-10-21 (×16): 3.125 mg via ORAL
  Filled 2017-10-14 (×16): qty 1

## 2017-10-14 MED ORDER — TECHNETIUM TO 99M ALBUMIN AGGREGATED
4.8000 | Freq: Once | INTRAVENOUS | Status: AC | PRN
  Administered 2017-10-14: 4.8 via INTRAVENOUS

## 2017-10-14 NOTE — Progress Notes (Signed)
Pharmacy Antibiotic Note  Candace Long is a 71 y.o. female with altered mental status and SOB admitted on 10/13/2017 with pneumonia.  Pharmacy has been consulted for levaquin and vancomycin dosing.  Note pt received x1 Cefepime 2 gm in ED and had allergy rxn of HIVES on face.  Plan: Levaquin 750 mg IV q48h Vancomycin 1 Gm ED + 1 Gm = 2 Gm load then 750 mg IV q24h for est AUC = 473  (AUC goal 400-500) F/u scr/cultures and levels     Temp (24hrs), Avg:100.6 F (38.1 C), Min:100.6 F (38.1 C), Max:100.6 F (38.1 C)   Recent Labs Lab 10/10/17 1514 10/13/17 2110 10/13/17 2119 10/13/17 2353  WBC  --  1.7*  --   --   CREATININE 1.47* 1.80*  --   --   LATICACIDVEN  --   --  1.27 1.71    Estimated Creatinine Clearance: 38 mL/min (A) (by C-G formula based on SCr of 1.8 mg/dL (H)).    Allergies  Allergen Reactions  . Cefepime Hives  . Hydrochlorothiazide Other (See Comments)    Hypokalemia, hyponatremia    Antimicrobials this admission: 11/1 cefepime >> x1 ED = allergic rxn 11/1 vancomycin >>  11/2 levaquin >>  Dose adjustments this admission:   Microbiology results:  BCx:   UCx:    Sputum:    MRSA PCR:   Thank you for allowing pharmacy to be a part of this patient's care.  Dorrene German 10/14/2017 12:41 AM

## 2017-10-14 NOTE — Progress Notes (Signed)
PROGRESS NOTE    Candace Long  WEX:[id number] DOB: February 28, 1946 DOA: 10/13/2017 PCP: Ria Bush, MD    Brief Narrative:  Candace Long  is a 71 y.o. female, w  Dm2, hypertension, hyperlipidemia, ckd stage 3, cerebral aneurysm repair,  C/o feeling poorly. + wheezing, slight confusion.  Slight cough , sob.    Denies fever, chills, cp, palp , n/v, diarrhea, brbpr.  Pt does note some slight cp under her neck with cough.    In ED,   Assessment & Plan:   Principal Problem:   Healthcare-associated pneumonia Active Problems:   Tachycardia   ARF (acute renal failure) ([place])   HCAP (healthcare-associated pneumonia)  #1 healthcare associated pneumonia Patient presented with complaints of feeling poorly, some wheezing, cough, shortness of breath. Chest x-ray done concerning for probable pneumonia. D-dimer was obtained which was elevated and as such will check a VQ scan. Urine Legionella antigen pending. Urine pneumococcus antigen pending. Patient with some clinical improvement since admission however not at baseline. Continue nebulizer treatments, IV vancomycin, IV Levaquin, supportive care. Follow.  #2 acute renal failure on chronic kidney disease stage III Questionable etiology. Likely secondary to a prerenal azotemia. Renal ultrasound negative for hydronephrosis however does have some echogenicity worrisome for chronic medical renal disease. Creatinine elevated at 2 will follow for now. Patient was on metformin prior to admission which will be discontinued and not resumed on discharge. Gentle hydration. Follow.  #3 tachycardia Questionable etiology. Improvement. D-dimer was elevated and a such will get a VQ scan. Patient however with clinical improvement. Continue Coreg.  #4 hypertension Stable. Continue Coreg, hydralazine, Norvasc, imdur.  #5 hyperlipidemia Continue statin.  #6 cardiomyopathy Currently stable. Continue cardiac regimen of Coreg, hydralazine, Imdur, Norvasc,  statin.   DVT prophylaxis: Lovenox Code Status: Full Family Communication: Updated patient. No family at bedside. Disposition Plan: Likely home once medically stable and improved.   Consultants:  None  Procedures:   VQ scan pending  Renal ultrasound 10/14/2017  Chest x-ray 10/13/2017    Antimicrobials:   IV cefepime 10/13/2017  IV Levaquin 10/14/2017  IV vancomycin 10/13/2017   Subjective: Patient states she feels better than on admission. Patient however states not at baseline. Patient denies any active chest pain. No nausea or emesis. Tolerating current diet.  Objective: Vitals:   10/14/17 0122 10/14/17 0209 10/14/17 0628 10/14/17 0902  BP:   (!) 109/59   Pulse:   93   Resp:   (!) 22   Temp:   98.4 F (36.9 C)   TempSrc:   Oral   SpO2:  91% 98% 98%  Weight: 103.7 kg (228 lb 9.6 oz)     Height: 5\' 9"  (1.753 m)       Intake/Output Summary (Last 24 hours) at 10/14/17 1306 Last data filed at 10/14/17 0809  Gross per 24 hour  Intake             3000 ml  Output                0 ml  Net             3000 ml   Filed Weights   10/14/17 0122  Weight: 103.7 kg (228 lb 9.6 oz)    Examination:  General exam: Appears calm and comfortable  Respiratory system: Some coarse breath sounds in the bases. No crackles. Respiratory effort normal. Cardiovascular system: S1 & S2 heard, RRR. No JVD, murmurs, rubs, gallops or clicks. No pedal edema. Gastrointestinal system: Abdomen is soft,  nontender, nondistended, no organomegaly, positive bowel sounds.  Central nervous system: Alert and oriented. No focal neurological deficits. Extremities: Symmetric 5 x 5 power. Skin: No rashes, lesions or ulcers Psychiatry: Judgement and insight appear normal. Mood & affect appropriate.     Data Reviewed: I have personally reviewed following labs and imaging studies  CBC:  Recent Labs Lab 10/13/17 2110 10/14/17 0905  WBC 1.7* 1.5*  NEUTROABS 0.6* 0.8*  HGB 9.9* 9.4*  HCT  30.8* 29.9*  MCV 93.1 94.6  PLT 466* 160*   Basic Metabolic Panel:  Recent Labs Lab 10/10/17 1514 10/13/17 2110 10/14/17 0905  NA 141 139 133*  K 4.2 3.6 4.6  CL 97 102 103  CO2 26 24 18*  GLUCOSE 99 97 295*  BUN 22 31* 38*  CREATININE 1.47* 1.80* 2.00*  CALCIUM 9.7 9.2 8.4*  MG  --   --  1.5*   GFR: Estimated Creatinine Clearance: 34 mL/min (A) (by C-G formula based on SCr of 2 mg/dL (H)). Liver Function Tests:  Recent Labs Lab 10/13/17 2110  AST 24  ALT 18  ALKPHOS 114  BILITOT 0.9  PROT 7.3  ALBUMIN 3.4*   No results for input(s): LIPASE, AMYLASE in the last 168 hours. No results for input(s): AMMONIA in the last 168 hours. Coagulation Profile:  Recent Labs Lab 10/13/17 2110  INR 1.07   Cardiac Enzymes:  Recent Labs Lab 10/14/17 0158 10/14/17 0629  TROPONINI 0.13* 0.09*   BNP (last 3 results)  Recent Labs  08/17/17 1831  PROBNP 541.0*   HbA1C: No results for input(s): HGBA1C in the last 72 hours. CBG:  Recent Labs Lab 10/14/17 0721 10/14/17 1133  GLUCAP 188* 267*   Lipid Profile: No results for input(s): CHOL, HDL, LDLCALC, TRIG, CHOLHDL, LDLDIRECT in the last 72 hours. Thyroid Function Tests: No results for input(s): TSH, T4TOTAL, FREET4, T3FREE, THYROIDAB in the last 72 hours. Anemia Panel: No results for input(s): VITAMINB12, FOLATE, FERRITIN, TIBC, IRON, RETICCTPCT in the last 72 hours. Sepsis Labs:  Recent Labs Lab 10/13/17 2119 10/13/17 2353  LATICACIDVEN 1.27 1.71    No results found for this or any previous visit (from the past 240 hour(s)).       Radiology Studies: Dg Chest 2 View  Result Date: 10/13/2017 CLINICAL DATA:  Shortness of breath and confusion, possible sepsis EXAM: CHEST  2 VIEW COMPARISON:  Chest radiograph 08/19/2017 FINDINGS: There is a catheter coursing along the right chest, possibly a ventriculostomy catheter. The heart is enlarged. There are patchy hazy opacities in the right lung. The left  lung is clear. No pneumothorax or pleural effusion. IMPRESSION: 1. Patchy, hazy airspace opacities in the right lung are suggestive of pneumonia. 2. Cardiomegaly. Electronically Signed   By: Ulyses Jarred M.D.   On: 10/13/2017 22:39   US Renal  Result Date: 10/14/2017 CLINICAL DATA:  Acute renal failure. EXAM: RENAL / URINARY TRACT ULTRASOUND COMPLETE COMPARISON:  None. FINDINGS: Right Kidney: Length: 9.9 cm. Cortical echogenicity is increased. Simple cyst measuring 1.1 cm in the midpole noted. No hydronephrosis visualized. Left Kidney: Length: 9.4 cm. Cortical echogenicity is increased. No mass or hydronephrosis visualized. Bladder: Appears normal for degree of bladder distention. Fibroid uterus noted. IMPRESSION: Negative for hydronephrosis. Increased cortical echogenicity bilaterally is consistent with medical renal disease. Electronically Signed   By: Inge Rise M.D.   On: 10/14/2017 10:59        Scheduled Meds: . albuterol  2.5 mg Nebulization Q6H  . amLODipine  10 mg  Oral Daily  . aspirin EC  81 mg Oral Daily  . atorvastatin  40 mg Oral q1800  . carvedilol  3.125 mg Oral BID  . enoxaparin (LOVENOX) injection  30 mg Subcutaneous Q24H  . hydrALAZINE  25 mg Oral Q8H  . insulin glargine  30 Units Subcutaneous Q2200  . isosorbide mononitrate  30 mg Oral Daily  . magnesium oxide  400 mg Oral BID  . methylPREDNISolone (SOLU-MEDROL) injection  80 mg Intravenous Q8H   Continuous Infusions: . sodium chloride 75 mL/hr at 10/14/17 0122  . levofloxacin (LEVAQUIN) IV 750 mg (10/14/17 0504)  . magnesium sulfate 1 - 4 g bolus IVPB    . vancomycin       LOS: 1 day    Time spent: 71 minutes    THOMPSON,DANIEL, MD [facility] Pager [phone]  If 7PM-7AM, please contact night-coverage [email or web address] Password [email or web address] 10/14/2017, 1:06 PM

## 2017-10-14 NOTE — Care Management Note (Signed)
Case Management Note  Patient Details  Name: Candace Long MRN: [id number] Date of Birth: [birth year]  Subjective/Objective:                  ams and poss uti  Action/Plan: Date:  October 14, 2017 Chart reviewed for concurrent status and case management needs.  Will continue to follow patient progress.  Discharge Planning: following for needs  Expected discharge date: October 17, 2017  Velva Harman, BSN, [place], [place]   Expected Discharge Date:                  Expected Discharge Plan:     In-House Referral:     Discharge planning Services     Post Acute Care Choice:    Choice offered to:     DME Arranged:    DME Agency:     HH Arranged:    [place] Agency:     Status of Service:     If discussed at [place] of Stay Meetings, dates discussed:    Additional Comments:  Leeroy Cha, RN 10/14/2017, 9:32 AM

## 2017-10-15 ENCOUNTER — Inpatient Hospital Stay (HOSPITAL_COMMUNITY): Payer: Medicare Other

## 2017-10-15 DIAGNOSIS — E785 Hyperlipidemia, unspecified: Secondary | ICD-10-CM

## 2017-10-15 DIAGNOSIS — Z794 Long term (current) use of insulin: Secondary | ICD-10-CM

## 2017-10-15 DIAGNOSIS — N183 Chronic kidney disease, stage 3 (moderate): Secondary | ICD-10-CM

## 2017-10-15 DIAGNOSIS — R748 Abnormal levels of other serum enzymes: Secondary | ICD-10-CM

## 2017-10-15 DIAGNOSIS — I10 Essential (primary) hypertension: Secondary | ICD-10-CM

## 2017-10-15 DIAGNOSIS — I5021 Acute systolic (congestive) heart failure: Secondary | ICD-10-CM

## 2017-10-15 DIAGNOSIS — N189 Chronic kidney disease, unspecified: Secondary | ICD-10-CM

## 2017-10-15 DIAGNOSIS — E1121 Type 2 diabetes mellitus with diabetic nephropathy: Secondary | ICD-10-CM

## 2017-10-15 DIAGNOSIS — E538 Deficiency of other specified B group vitamins: Secondary | ICD-10-CM

## 2017-10-15 LAB — BASIC METABOLIC PANEL
Anion gap: 12 (ref 5–15)
BUN: 63 mg/dL — ABNORMAL HIGH (ref 6–20)
CALCIUM: 8.7 mg/dL — AB (ref 8.9–10.3)
CHLORIDE: 98 mmol/L — AB (ref 101–111)
CO2: 22 mmol/L (ref 22–32)
CREATININE: 2.95 mg/dL — AB (ref 0.44–1.00)
GFR, EST AFRICAN AMERICAN: 18 mL/min — AB (ref >60)
GFR, EST NON AFRICAN AMERICAN: 15 mL/min — AB (ref >60)
Glucose, Bld: 208 mg/dL — ABNORMAL HIGH (ref 65–99)
Potassium: 4.5 mmol/L (ref 3.5–5.1)
SODIUM: 132 mmol/L — AB (ref 135–145)

## 2017-10-15 LAB — CBC WITH DIFFERENTIAL/PLATELET
BASOS PCT: 1 %
Basophils Absolute: 0 10*3/uL (ref 0.0–0.1)
EOS ABS: 0 10*3/uL (ref 0.0–0.7)
Eosinophils Relative: 1 %
HCT: 27.9 % — ABNORMAL LOW (ref 36.0–46.0)
HEMOGLOBIN: 8.9 g/dL — AB (ref 12.0–15.0)
LYMPHS ABS: 1.2 10*3/uL (ref 0.7–4.0)
Lymphocytes Relative: 53 %
MCH: 30 pg (ref 26.0–34.0)
MCHC: 31.9 g/dL (ref 30.0–36.0)
MCV: 93.9 fL (ref 78.0–100.0)
MONO ABS: 0 10*3/uL — AB (ref 0.1–1.0)
Monocytes Relative: 1 %
NEUTROS ABS: 1 10*3/uL — AB (ref 1.7–7.7)
Neutrophils Relative %: 44 %
PLATELETS: 469 10*3/uL — AB (ref 150–400)
RBC: 2.97 MIL/uL — ABNORMAL LOW (ref 3.87–5.11)
RDW: 15.4 % (ref 11.5–15.5)
WBC: 2.2 10*3/uL — ABNORMAL LOW (ref 4.0–10.5)

## 2017-10-15 LAB — GLUCOSE, CAPILLARY
GLUCOSE-CAPILLARY: 198 mg/dL — AB (ref 65–99)
GLUCOSE-CAPILLARY: 295 mg/dL — AB (ref 65–99)
GLUCOSE-CAPILLARY: 266 mg/dL — AB (ref 65–99)
Glucose-Capillary: 232 mg/dL — ABNORMAL HIGH (ref 65–99)

## 2017-10-15 LAB — SODIUM, URINE, RANDOM

## 2017-10-15 LAB — CREATININE, URINE, RANDOM: Creatinine, Urine: 148.32 mg/dL

## 2017-10-15 LAB — STREP PNEUMONIAE URINARY ANTIGEN: Strep Pneumo Urinary Antigen: NEGATIVE

## 2017-10-15 LAB — MRSA PCR SCREENING: MRSA by PCR: NEGATIVE

## 2017-10-15 LAB — MAGNESIUM: Magnesium: 2.7 mg/dL — ABNORMAL HIGH (ref 1.7–2.4)

## 2017-10-15 LAB — BRAIN NATRIURETIC PEPTIDE: B NATRIURETIC PEPTIDE 5: 309.8 pg/mL — AB (ref 0.0–100.0)

## 2017-10-15 MED ORDER — BUDESONIDE 0.5 MG/2ML IN SUSP
0.5000 mg | Freq: Two times a day (BID) | RESPIRATORY_TRACT | Status: DC
  Administered 2017-10-15 – 2017-10-21 (×11): 0.5 mg via RESPIRATORY_TRACT
  Filled 2017-10-15 (×13): qty 2

## 2017-10-15 MED ORDER — METHYLPREDNISOLONE SODIUM SUCC 125 MG IJ SOLR
60.0000 mg | Freq: Two times a day (BID) | INTRAMUSCULAR | Status: DC
  Administered 2017-10-16 – 2017-10-18 (×6): 60 mg via INTRAVENOUS
  Filled 2017-10-15 (×6): qty 2

## 2017-10-15 MED ORDER — VITAMIN B-12 1000 MCG PO TABS
1000.0000 ug | ORAL_TABLET | Freq: Every day | ORAL | Status: DC
  Administered 2017-10-15 – 2017-10-21 (×7): 1000 ug via ORAL
  Filled 2017-10-15 (×7): qty 1

## 2017-10-15 MED ORDER — SODIUM CHLORIDE 0.9 % IV SOLN
INTRAVENOUS | Status: DC
  Administered 2017-10-15: 10:00:00 via INTRAVENOUS

## 2017-10-15 MED ORDER — METHYLPREDNISOLONE SODIUM SUCC 125 MG IJ SOLR
60.0000 mg | Freq: Three times a day (TID) | INTRAMUSCULAR | Status: DC
  Administered 2017-10-15: 60 mg via INTRAVENOUS
  Filled 2017-10-15: qty 2

## 2017-10-15 MED ORDER — FUROSEMIDE 10 MG/ML IJ SOLN
40.0000 mg | Freq: Two times a day (BID) | INTRAMUSCULAR | Status: DC
  Administered 2017-10-15 – 2017-10-16 (×2): 40 mg via INTRAVENOUS
  Filled 2017-10-15 (×2): qty 4

## 2017-10-15 MED ORDER — GUAIFENESIN-DM 100-10 MG/5ML PO SYRP
5.0000 mL | ORAL_SOLUTION | ORAL | Status: DC | PRN
  Administered 2017-10-15 – 2017-10-20 (×5): 5 mL via ORAL
  Filled 2017-10-15 (×5): qty 10

## 2017-10-15 MED ORDER — INSULIN GLARGINE 100 UNIT/ML ~~LOC~~ SOLN
20.0000 [IU] | Freq: Every day | SUBCUTANEOUS | Status: DC
  Administered 2017-10-15: 20 [IU] via SUBCUTANEOUS
  Filled 2017-10-15: qty 0.2

## 2017-10-15 MED ORDER — IPRATROPIUM-ALBUTEROL 0.5-2.5 (3) MG/3ML IN SOLN
3.0000 mL | RESPIRATORY_TRACT | Status: DC | PRN
  Administered 2017-10-15: 3 mL via RESPIRATORY_TRACT
  Filled 2017-10-15: qty 3

## 2017-10-15 MED ORDER — IPRATROPIUM-ALBUTEROL 0.5-2.5 (3) MG/3ML IN SOLN
3.0000 mL | Freq: Four times a day (QID) | RESPIRATORY_TRACT | Status: DC
  Administered 2017-10-15 – 2017-10-16 (×6): 3 mL via RESPIRATORY_TRACT
  Filled 2017-10-15 (×6): qty 3

## 2017-10-15 NOTE — Progress Notes (Signed)
Pt bladder scanned. 219cc noted. Pt voided right after, another 100cc.

## 2017-10-15 NOTE — Progress Notes (Signed)
Pharmacy Antibiotic Note  Candace Long is a 71 y.o. female with altered mental status and SOB admitted on 10/13/2017 with pneumonia.  Pharmacy has been consulted for levaquin and vancomycin dosing.  Note pt received x1 Cefepime 2 gm in ED and had allergy rxn of HIVES on face.  Day #2 antibiotics.  SCr worsening today.  Has received vancomycin 2g load late 11/1 PM and the first 750 mg maintenance dose last night 11/2.  MRSA screen ordered.  Plan:  Hold vancomycin for elevated SCr.  Will check random vancomycin trough tonight to determine when next dose can be administered.  F/u MRSA PCR and consider discontinuing vancomycin if negative.  Continue Levaquin 750 mg IV q48h for now. Will need to reduce dose if SCr continues to worsen and CrCl<20.  Next dose due 11/4.  Daily SCr.  Height: 5\' 9"  (175.3 cm) Weight: 240 lb 8 oz (109.1 kg) IBW/kg (Calculated) : 66.2  Temp (24hrs), Avg:98 F (36.7 C), Min:97.8 F (36.6 C), Max:98.2 F (36.8 C)   Recent Labs Lab 10/10/17 1514 10/13/17 2110 10/13/17 2119 10/13/17 2353 10/14/17 0905 10/15/17 0656  WBC  --  1.7*  --   --  1.5* 2.2*  CREATININE 1.47* 1.80*  --   --  2.00* 2.95*  LATICACIDVEN  --   --  1.27 1.71  --   --     Estimated Creatinine Clearance: 23.7 mL/min (A) (by C-G formula based on SCr of 2.95 mg/dL (H)).    Allergies  Allergen Reactions  . Cefepime Hives  . Hydrochlorothiazide Other (See Comments)    Hypokalemia, hyponatremia    Antimicrobials this admission: 11/1 cefepime >> x1 ED = allergic rxn 11/1 vancomycin >>  11/2 levaquin >>  Dose adjustments this admission: 11/3 random vanc =   Microbiology results: 11/2 BCx:  11/3 MRSA PCR:   Thank you for allowing pharmacy to be a part of this patient's care.  Hershal Coria 10/15/2017 10:00 AM

## 2017-10-15 NOTE — Progress Notes (Addendum)
PROGRESS NOTE    Candace Long  EVO:[id number] DOB: Aug 17, 1946 DOA: 10/13/2017 PCP: Ria Bush, MD    Brief Narrative:  Candace Long  is a 71 y.o. female, w  Dm2, hypertension, hyperlipidemia, ckd stage 3, cerebral aneurysm repair,  C/o feeling poorly. + wheezing, slight confusion.  Slight cough , sob.    Denies fever, chills, cp, palp , n/v, diarrhea, brbpr.  Pt does note some slight cp under her neck with cough.    In ED,   Assessment & Plan:   Principal Problem:   Healthcare-associated pneumonia Active Problems:   Hypertension   Controlled type 2 diabetes mellitus with diabetic nephropathy (H[facility])   HLD (hyperlipidemia)   Severe obesity (BMI 35.0-39.9) with comorbidity ([facility])   Chronic kidney disease, stage III (moderate) ([facility])   Vitamin D deficiency   Vitamin B12 deficiency   Elevated troponin   Tachycardia   Acute kidney injury superimposed on CKD ([place]) Stage 3   HCAP (healthcare-associated pneumonia)  #1 healthcare associated pneumonia Patient presented with complaints of feeling poorly, some wheezing, cough, shortness of breath. Chest x-ray done concerning for probable pneumonia. D-dimer was obtained which was elevated, VQ scan which was done showed intermediate probability. Urine Legionella antigen pending. Urine pneumococcus antigen is negative. MRSA PCR is negative. Continue nebulizer treatments, IV Levaquin, IV cefepime. Will discontinue IV vancomycin.   #2 acute renal failure on chronic kidney disease stage III Questionable etiology. Likely secondary to a prerenal azotemia. Likely secondary to acute systolic CHF exacerbation. Renal ultrasound negative for hydronephrosis however does have some echogenicity worrisome for chronic medical renal disease. Renal function worsening in creatinine currently at 2.95 from 1.80 on admission. Urine sodium obtained was less than 10. Patient noted to be volume overloaded on examination with lower extremity edema, positive JVD.  Repeat chest x-ray. Will saline lock IV fluids and place on Lasix 40 mg IV every 12 hours. Strict I's and O's. Daily weights. Follow. If no improvement in renal function and if it continues worsening will likely need nephrology input.   #3 probable acute on chronic systolic heart failure Patient had presented with worsening shortness of breath. Patient noted on physical exam today to have some lower extremity edema, positive JVD. Current weight is 109.1 kg. Cardiac enzymes on admission were slightly minimally elevated however likely secondary to demand. 2-D echo from 08/20/2017 with a EF of 40%, diffuse hypokinesis. Patient's metformin was discontinued on admission. Place on Lasix 40 mg IV every 12 hours. Strict I's and O's. Daily weights.  #4 tachycardia Questionable etiology. Improvement. D-dimer was elevated and as such VQ scan was obtained which showed intermediate probability ventilation/perfusion study for PE. Highly doubt if patient does have a PE more likely symptoms secondary to pneumonia and probable CHF exacerbation. Continue Coreg.  #5 hypertension Stable. Continue Coreg, hydralazine, Norvasc, imdur.  #6 hyperlipidemia Fasting lipid panel from 08/20/2017 with a LDL of 67, cholesterol of 118. Continue statin.  #7 cardiomyopathy Currently stable. Continue cardiac regimen of Coreg, hydralazine, Imdur, Norvasc, statin.  #8 diabetes mellitus type 2 Hemoglobin A1c 6.3. CBGs have been ranging from 198-308. Increase Lantus to 20 units daily. Continue sliding scale insulin.     DVT prophylaxis: Lovenox Code Status: Full Family Communication: Updated patient. No family at bedside. Disposition Plan: Likely home once medically stable and improved.   Consultants:  None  Procedures:   VQ scan 10/14/2017  Renal ultrasound 10/14/2017  Chest x-ray 10/13/2017, 10/15/2017    Antimicrobials:   IV cefepime 10/13/2017  IV  Levaquin 10/14/2017  IV vancomycin 10/13/2017>>>>>  10/15/2017   Subjective: Patient complaining of wheezing. Patient with some shortness of breath. Patient denies indigestion. No nausea or emesis. Patient states not that much significant improvement since admission.   Objective: Vitals:   10/15/17 0833 10/15/17 0924 10/15/17 0946 10/15/17 1025  BP:    109/69  Pulse:    77  Resp:    (!) 24  Temp:    97.7 F (36.5 C)  TempSrc:    Oral  SpO2: 99% 92%  98%  Weight:   109.1 kg (240 lb 8 oz)   Height:        Intake/Output Summary (Last 24 hours) at 10/15/17 1255 Last data filed at 10/15/17 1200  Gross per 24 hour  Intake              990 ml  Output              100 ml  Net              890 ml   Filed Weights   10/14/17 0122 10/15/17 0946  Weight: 103.7 kg (228 lb 9.6 oz) 109.1 kg (240 lb 8 oz)    Examination:  General exam: Appears calm and comfortable  Respiratory system: Some coarse breath sounds in the bases. Inspiratory and expiratory wheezing.  Cardiovascular system: S1 & S2 heard, RRR. No murmurs, rubs, gallops or clicks. Positive JVD. 2+ bilateral lower extremity edema.  Gastrointestinal system: Abdomen is soft, nontender, nondistended, no organomegaly, positive bowel sounds.  Central nervous system: Alert and oriented. No focal neurological deficits. Extremities: Symmetric 5 x 5 power. Skin: Some venous stasis changes noted on bilateral shins. Psychiatry: Judgement and insight appear normal. Mood & affect appropriate.     Data Reviewed: I have personally reviewed following labs and imaging studies  CBC:  Recent Labs Lab 10/13/17 2110 10/14/17 0905 10/15/17 0656  WBC 1.7* 1.5* 2.2*  NEUTROABS 0.6* 0.8* 1.0*  HGB 9.9* 9.4* 8.9*  HCT 30.8* 29.9* 27.9*  MCV 93.1 94.6 93.9  PLT 466* 439* 852*   Basic Metabolic Panel:  Recent Labs Lab 10/10/17 1514 10/13/17 2110 10/14/17 0905 10/15/17 0656  NA 141 139 133* 132*  K 4.2 3.6 4.6 4.5  CL 97 102 103 98*  CO2 26 24 18* 22  GLUCOSE 99 97 295* 208*  BUN  22 31* 38* 63*  CREATININE 1.47* 1.80* 2.00* 2.95*  CALCIUM 9.7 9.2 8.4* 8.7*  MG  --   --  1.5* 2.7*   GFR: Estimated Creatinine Clearance: 23.7 mL/min (A) (by C-G formula based on SCr of 2.95 mg/dL (H)). Liver Function Tests:  Recent Labs Lab 10/13/17 2110  AST 24  ALT 18  ALKPHOS 114  BILITOT 0.9  PROT 7.3  ALBUMIN 3.4*   No results for input(s): LIPASE, AMYLASE in the last 168 hours. No results for input(s): AMMONIA in the last 168 hours. Coagulation Profile:  Recent Labs Lab 10/13/17 2110  INR 1.07   Cardiac Enzymes:  Recent Labs Lab 10/14/17 0158 10/14/17 0629 10/14/17 1314  TROPONINI 0.13* 0.09* 0.08*   BNP (last 3 results)  Recent Labs  08/17/17 1831  PROBNP 541.0*   HbA1C:  Recent Labs  10/14/17 1757  HGBA1C 6.3*   CBG:  Recent Labs Lab 10/14/17 1133 10/14/17 1653 10/14/17 2218 10/15/17 0740 10/15/17 1115  GLUCAP 267* 324* 308* 198* 295*   Lipid Profile: No results for input(s): CHOL, HDL, LDLCALC, TRIG, CHOLHDL, LDLDIRECT in the last  72 hours. Thyroid Function Tests: No results for input(s): TSH, T4TOTAL, FREET4, T3FREE, THYROIDAB in the last 72 hours. Anemia Panel: No results for input(s): VITAMINB12, FOLATE, FERRITIN, TIBC, IRON, RETICCTPCT in the last 72 hours. Sepsis Labs:  Recent Labs Lab 10/13/17 2119 10/13/17 2353  LATICACIDVEN 1.27 1.71    Recent Results (from the past 240 hour(s))  MRSA PCR Screening     Status: None   Collection Time: 10/15/17 10:10 AM  Result Value Ref Range Status   MRSA by PCR NEGATIVE NEGATIVE Final    Comment:        The GeneXpert MRSA Assay (FDA approved for NASAL specimens only), is one component of a comprehensive MRSA colonization surveillance program. It is not intended to diagnose MRSA infection nor to guide or monitor treatment for MRSA infections.          Radiology Studies: Dg Chest 2 View  Result Date: 10/13/2017 CLINICAL DATA:  Shortness of breath and confusion,  possible sepsis EXAM: CHEST  2 VIEW COMPARISON:  Chest radiograph 08/19/2017 FINDINGS: There is a catheter coursing along the right chest, possibly a ventriculostomy catheter. The heart is enlarged. There are patchy hazy opacities in the right lung. The left lung is clear. No pneumothorax or pleural effusion. IMPRESSION: 1. Patchy, hazy airspace opacities in the right lung are suggestive of pneumonia. 2. Cardiomegaly. Electronically Signed   By: Ulyses Jarred M.D.   On: 10/13/2017 22:39   US Renal  Result Date: 10/14/2017 CLINICAL DATA:  Acute renal failure. EXAM: RENAL / URINARY TRACT ULTRASOUND COMPLETE COMPARISON:  None. FINDINGS: Right Kidney: Length: 9.9 cm. Cortical echogenicity is increased. Simple cyst measuring 1.1 cm in the midpole noted. No hydronephrosis visualized. Left Kidney: Length: 9.4 cm. Cortical echogenicity is increased. No mass or hydronephrosis visualized. Bladder: Appears normal for degree of bladder distention. Fibroid uterus noted. IMPRESSION: Negative for hydronephrosis. Increased cortical echogenicity bilaterally is consistent with medical renal disease. Electronically Signed   By: Inge Rise M.D.   On: 10/14/2017 10:59   Nm Pulmonary Perf And Vent  Result Date: 10/14/2017 CLINICAL DATA:  Shortness of breath. EXAM: NUCLEAR MEDICINE VENTILATION - PERFUSION LUNG SCAN TECHNIQUE: Ventilation images were obtained in multiple projections using inhaled aerosol Tc-12m DTPA. Perfusion images were obtained in multiple projections after intravenous injection of Tc-39m MAA. RADIOPHARMACEUTICALS:  30.0 mCi Technetium-25m DTPA aerosol inhalation and 4.8 mCi Technetium-32m MAA IV COMPARISON:  Chest radiograph, 10/13/2017 FINDINGS: Ventilation: There are ventilatory defects that are more evident on the left, involving significant portion of the left lower lobe. There is a segmental appearing ventilatory defect involving the right upper lobe. Perfusion: There are perfusion defects which are  matched to the ventilatory abnormalities. On the left, there is relative decreased profusion in the lower lobe, that is not clearly segmental. On the right, there is a segmental appearing defect along the posterior upper lobe, which corresponds to an area of consolidation on the previous day's chest radiograph. IMPRESSION: 1. Intermediate probability ventilation-perfusion study for pulmonary thromboembolism. Based on modified PIOPED criteria, with a moderate to large matching segmental defect noted in the right upper lobe with a matching chest radiographic abnormality (triple match). Ventilatory and perfusion defects on the left are not clearly segmental, and ventilatory defects are larger. Electronically Signed   By: Lajean Manes M.D.   On: 10/14/2017 16:04        Scheduled Meds: . amLODipine  10 mg Oral Daily  . aspirin EC  81 mg Oral Daily  . atorvastatin  40 mg Oral q1800  . budesonide (PULMICORT) nebulizer solution  0.5 mg Nebulization BID  . carvedilol  3.125 mg Oral BID  . enoxaparin (LOVENOX) injection  30 mg Subcutaneous Q24H  . furosemide  40 mg Intravenous Q12H  . hydrALAZINE  25 mg Oral Q8H  . insulin aspart  0-20 Units Subcutaneous TID WC  . insulin aspart  0-5 Units Subcutaneous QHS  . insulin glargine  10 Units Subcutaneous QHS  . ipratropium-albuterol  3 mL Nebulization Q6H  . isosorbide mononitrate  30 mg Oral Daily  . magnesium oxide  400 mg Oral BID  . methylPREDNISolone (SOLU-MEDROL) injection  60 mg Intravenous Q8H  . vitamin B-12  1,000 mcg Oral Daily   Continuous Infusions: . levofloxacin (LEVAQUIN) IV 750 mg (10/14/17 0504)     LOS: 2 days    Time spent: 10 minutes    Raela Bohl, MD [facility] Pager [phone]  If 7PM-7AM, please contact night-coverage [email or web address] Password [email or web address] 10/15/2017, 12:55 PM

## 2017-10-16 LAB — CBC WITH DIFFERENTIAL/PLATELET
BAND NEUTROPHILS: 0 %
BASOS ABS: 0 10*3/uL (ref 0.0–0.1)
Basophils Relative: 0 %
Blasts: 0 %
EOS ABS: 0 10*3/uL (ref 0.0–0.7)
Eosinophils Relative: 0 %
HEMATOCRIT: 27.2 % — AB (ref 36.0–46.0)
Hemoglobin: 8.7 g/dL — ABNORMAL LOW (ref 12.0–15.0)
Lymphocytes Relative: 40 %
Lymphs Abs: 0.6 10*3/uL — ABNORMAL LOW (ref 0.7–4.0)
MCH: 29.6 pg (ref 26.0–34.0)
MCHC: 32 g/dL (ref 30.0–36.0)
MCV: 92.5 fL (ref 78.0–100.0)
METAMYELOCYTES PCT: 0 %
MYELOCYTES: 0 %
Monocytes Absolute: 0.1 10*3/uL (ref 0.1–1.0)
Monocytes Relative: 7 %
Neutro Abs: 0.8 10*3/uL — ABNORMAL LOW (ref 1.7–7.7)
Neutrophils Relative %: 52 %
Other: 1 %
PROMYELOCYTES ABS: 0 %
Platelets: 437 10*3/uL — ABNORMAL HIGH (ref 150–400)
RBC: 2.94 MIL/uL — AB (ref 3.87–5.11)
RDW: 15.1 % (ref 11.5–15.5)
WBC: 1.6 10*3/uL — AB (ref 4.0–10.5)
nRBC: 0 /100 WBC

## 2017-10-16 LAB — URINALYSIS, ROUTINE W REFLEX MICROSCOPIC
Bilirubin Urine: NEGATIVE
Glucose, UA: NEGATIVE mg/dL
HGB URINE DIPSTICK: NEGATIVE
Ketones, ur: NEGATIVE mg/dL
Leukocytes, UA: NEGATIVE
NITRITE: NEGATIVE
PROTEIN: 100 mg/dL — AB
SPECIFIC GRAVITY, URINE: 1.012 (ref 1.005–1.030)
pH: 5 (ref 5.0–8.0)

## 2017-10-16 LAB — BASIC METABOLIC PANEL
ANION GAP: 11 (ref 5–15)
BUN: 79 mg/dL — ABNORMAL HIGH (ref 6–20)
CALCIUM: 8.6 mg/dL — AB (ref 8.9–10.3)
CO2: 21 mmol/L — AB (ref 22–32)
Chloride: 96 mmol/L — ABNORMAL LOW (ref 101–111)
Creatinine, Ser: 3.1 mg/dL — ABNORMAL HIGH (ref 0.44–1.00)
GFR, EST AFRICAN AMERICAN: 17 mL/min — AB (ref >60)
GFR, EST NON AFRICAN AMERICAN: 14 mL/min — AB (ref >60)
GLUCOSE: 164 mg/dL — AB (ref 65–99)
POTASSIUM: 4.6 mmol/L (ref 3.5–5.1)
Sodium: 128 mmol/L — ABNORMAL LOW (ref 135–145)

## 2017-10-16 LAB — PROTEIN / CREATININE RATIO, URINE
CREATININE, URINE: 73.12 mg/dL
Protein Creatinine Ratio: 1 mg/mg{Cre} — ABNORMAL HIGH (ref 0.00–0.15)
TOTAL PROTEIN, URINE: 73 mg/dL

## 2017-10-16 LAB — GLUCOSE, CAPILLARY
GLUCOSE-CAPILLARY: 228 mg/dL — AB (ref 65–99)
GLUCOSE-CAPILLARY: 254 mg/dL — AB (ref 65–99)
Glucose-Capillary: 210 mg/dL — ABNORMAL HIGH (ref 65–99)
Glucose-Capillary: 199 mg/dL — ABNORMAL HIGH (ref 65–99)

## 2017-10-16 LAB — SODIUM, URINE, RANDOM

## 2017-10-16 MED ORDER — INSULIN GLARGINE 100 UNIT/ML ~~LOC~~ SOLN
30.0000 [IU] | Freq: Every day | SUBCUTANEOUS | Status: DC
  Administered 2017-10-16: 30 [IU] via SUBCUTANEOUS
  Filled 2017-10-16 (×2): qty 0.3

## 2017-10-16 MED ORDER — HYDRALAZINE HCL 10 MG PO TABS
10.0000 mg | ORAL_TABLET | Freq: Three times a day (TID) | ORAL | Status: DC
Start: 2017-10-16 — End: 2017-10-21
  Administered 2017-10-16 – 2017-10-21 (×15): 10 mg via ORAL
  Filled 2017-10-16 (×15): qty 1

## 2017-10-16 MED ORDER — FUROSEMIDE 10 MG/ML IJ SOLN
80.0000 mg | Freq: Once | INTRAMUSCULAR | Status: AC
  Administered 2017-10-16: 80 mg via INTRAVENOUS
  Filled 2017-10-16: qty 8

## 2017-10-16 MED ORDER — INSULIN ASPART 100 UNIT/ML ~~LOC~~ SOLN
5.0000 [IU] | Freq: Three times a day (TID) | SUBCUTANEOUS | Status: DC
  Administered 2017-10-16 – 2017-10-17 (×5): 5 [IU] via SUBCUTANEOUS

## 2017-10-16 MED ORDER — FUROSEMIDE 10 MG/ML IJ SOLN
80.0000 mg | Freq: Two times a day (BID) | INTRAMUSCULAR | Status: DC
  Administered 2017-10-16 – 2017-10-18 (×4): 80 mg via INTRAVENOUS
  Filled 2017-10-16 (×4): qty 8

## 2017-10-16 NOTE — Consult Note (Signed)
Renal Service Consult Note [facility]  Nakenya Theall 10/16/2017 [place] D Requesting Physician:  D. Grandville Silos, MD  Reason for Consult:  Acute on CKD3 HPI: The patient is a 71 y.o. year-old w/ hx of IDDM, HTN, syst/ diast CHF, CKD 3, hx cerebral aneursym repair, HL who presented to ED on 11/1 3 days ago with AMS and SOB.  Had been confused for 3 d per family.  CXR showed R sided infiltrates, temp was 100.6 and pt was admitted and started on IV abx for PNA.  Creat on admission was 1.47 and has steadily ^'d to 2.9 yest and 3.1 today.  Asked to see for acute/ chron renal failure.   Pt got 3 doses of IV vanc (2.75gm today), 1 dose maxipime on first 24hrs in hospital, then switched to IV levaquin/ vanc as she had an allergic reaction with hives on the face to maxipime. She got IVF's for 48 hrs then was switched to IV lasix 40 bid yest and fluids were stopped. No acei/ ARB, contrast, nsaids.  BP's have been in the low-normal range, not gross hypotension though.  Fever resolved quickly, afeb x 48 hrs.  F/U CXR at 1 pm yest showed resolution of R sided infiltrates (CXR was done before she got 1st lasix dose).  UOP yest was 1300, before that none recorded.  Bladder scan done on 11/3 showed 219 cc. V/Q scan done which was indeterminate.   Today pt says her breathing is a little worse than when she was admitted.  Wt on admit was recorded as 103k, up to 109 yest and 114 today.  Reweighed now at [place] in the bed. Denies any CP, abd pain, n/v/d. +wheezing and cough and SOB.   Pt grew up in [place], she is a divorcee.  Was living with her son and wife but thinks she is now living in a group home or a nursing facility, she it not sure.    Old chart: Sept 18 - presented w/ chest pain and ^DOE, LE edeam. ECHO LVED 40%, had L/ R heart cath, Myoview was + so pt had L/ R heart cath which showed no sig CAD but LVEDP was high at 33 so she was diuresed with IV lasix and her symptoms improved.  Prob  HTNsive heart disease. Had CKD 3 w/ creat 1.5 range. dc'd on po lasix 40 bid.      ROS  denies CP  no joint pain   no HA  no blurry vision  no rash  no diarrhea  no nausea/ vomiting  no dysuria  no difficulty voiding  no change in urine color    Past Medical History  Past Medical History:  Diagnosis Date  . Chronic kidney disease, stage III (moderate) ([place]) 05/24/2014  . HLD (hyperlipidemia)   . Hypertension   . Obesity   . S/P cerebral aneurysm repair 1993   some residual memory impairment  . Uncontrolled type 2 diabetes mellitus with nephropathy [facility])    DSME [facility] 06/2014, did not keep f/u classes so discharged   Past Surgical History  Past Surgical History:  Procedure Laterality Date  . CARDIOVASCULAR STRESS TEST  12/2012   WNL EF 55% ([place])  . COLONOSCOPY  07/2014   1 tubular adenoma, severe diverticulosis, rpt 5 yrs [facility])  . CRANIOTOMY  1993   cerebral aneurysm repair  . VENTRICULOPERITONEAL SHUNT  1993   Family History  Family History  Problem Relation Age of Onset  . Hypertension Mother   .  Diabetes Mother   . Diabetes Sister   . Heart failure Sister   . CAD Neg Hx   . Stroke Neg Hx   . Cancer Neg Hx   . Colon cancer Neg Hx   . Esophageal cancer Neg Hx   . Rectal cancer Neg Hx   . Stomach cancer Neg Hx    Social History  reports that she quit smoking about 8 years ago. Her smoking use included cigarettes. She smoked 1.00 pack per day. She quit smokeless tobacco use about 6 years ago. Her smokeless tobacco use included snuff. She reports that she does not drink alcohol or use drugs. Allergies  Allergies  Allergen Reactions  . Cefepime Hives  . Hydrochlorothiazide Other (See Comments)    Hypokalemia, hyponatremia   Home medications Prior to Admission medications   Medication Sig Start Date End Date Taking? Authorizing Provider  albuterol (VENTOLIN HFA) 108 (90 Base) MCG/ACT inhaler Inhale 2 puffs into the lungs every 6 (six) hours as needed for  wheezing or shortness of breath. 12/27/15  Yes Ria Bush, MD  amLODipine (NORVASC) 10 MG tablet TAKE 1 TABLET (10 MG TOTAL) BY MOUTH DAILY. 06/01/17  Yes Ria Bush, MD  aspirin EC 81 MG tablet Take 81 mg by mouth daily.    Yes [provider]  atorvastatin (LIPITOR) 40 MG tablet TAKE 1 TABLET (40 MG TOTAL) BY MOUTH DAILY. 06/01/17  Yes Ria Bush, MD  carvedilol (COREG) 3.125 MG tablet Take 1 tablet (3.125 mg total) by mouth 2 (two) times daily. 10/10/17 01/08/18 Yes Kilroy, Doreene Burke, PA-C  Cholecalciferol (VITAMIN D) 1000 UNITS capsule Take 1 capsule (1,000 Units total) by mouth daily. 05/27/14  Yes Ria Bush, MD  furosemide (LASIX) 40 MG tablet 2 tablets AM and 1 tablet PM mon, wed and Friday one tab AM and one tab PM other days 10/10/17  Yes Kilroy, Lurena Joiner K, PA-C  hydrALAZINE (APRESOLINE) 25 MG tablet Take 1 tablet (25 mg total) by mouth every 8 (eight) hours. 08/24/17  Yes Dunn, Dayna N, PA-C  isosorbide mononitrate (IMDUR) 30 MG 24 hr tablet Take 1 tablet (30 mg total) by mouth daily. 08/24/17  Yes Dunn, Dayna N, PA-C  LANTUS SOLOSTAR 100 UNIT/ML Solostar Pen INJECT 30 UNITS INTO THE SKIN DAILY. Patient taking differently: Inject 30 units into the skin every morning 02/18/17  Yes Ria Bush, MD  magnesium oxide (MAG-OX) 400 (241.3 Mg) MG tablet Take 1 tablet (400 mg total) by mouth 2 (two) times daily. 08/24/17  Yes Dunn, Dayna N, PA-C  metFORMIN (GLUCOPHAGE) 1000 MG tablet TAKE 1 TABLET BY MOUTH 2 TIMES DAILY WITH A MEAL. 08/29/17  Yes Ria Bush, MD  potassium chloride SA (KLOR-CON M20) 20 MEQ tablet Take 2 tablets (40 mEq total) by mouth 2 (two) times daily. Patient taking differently: Take 20 mEq by mouth 4 (four) times daily.  08/24/17  Yes Dunn, Dayna N, PA-C  vitamin B-12 (CYANOCOBALAMIN) 1000 MCG tablet Take 1 tablet (1,000 mcg total) by mouth daily. 05/28/15  Yes Ria Bush, MD  B-D ULTRAFINE III SHORT PEN 31G X 8 MM MISC USE TO INJECT LANTUS  ONCE DAILY. 07/05/17   Ria Bush, MD   Liver Function Tests Recent Labs  Lab 10/13/17 2110  AST 24  ALT 18  ALKPHOS 114  BILITOT 0.9  PROT 7.3  ALBUMIN 3.4*   No results for input(s): LIPASE, AMYLASE in the last 168 hours. CBC Recent Labs  Lab 10/14/17 0905 10/15/17 0656 10/16/17 0643  WBC  1.5* 2.2* 1.6*  NEUTROABS 0.8* 1.0* 0.8*  HGB 9.4* 8.9* 8.7*  HCT 29.9* 27.9* 27.2*  MCV 94.6 93.9 92.5  PLT 439* 469* 572*   Basic Metabolic Panel Recent Labs  Lab 10/10/17 1514 10/13/17 2110 10/14/17 0905 10/15/17 0656 10/16/17 0643  NA 141 139 133* 132* 128*  K 4.2 3.6 4.6 4.5 4.6  CL 97 102 103 98* 96*  CO2 26 24 18* 22 21*  GLUCOSE 99 97 295* 208* 164*  BUN 22 31* 38* 63* 79*  CREATININE 1.47* 1.80* 2.00* 2.95* 3.10*  CALCIUM 9.7 9.2 8.4* 8.7* 8.6*   Iron/TIBC/Ferritin/ %Sat    Component Value Date/Time   IRON 68 04/28/2017 1150   TIBC 354 04/28/2017 1150   FERRITIN 88 04/28/2017 1150   IRONPCTSAT 19 04/28/2017 1150    Vitals:   10/15/17 2251 10/16/17 0123 10/16/17 0613 10/16/17 0836  BP:   111/78   Pulse:   (!) 53   Resp:   20   Temp:   97.7 F (36.5 C)   TempSrc:   Oral   SpO2: 96% 95% 92% 99%  Weight:      Height:       Exam Gen elderly AAF, wheezing audibly No rash, cyanosis or gangrene Sclera anicteric, throat clear  +JVD no bruit Chest diffuse bilat coarse exp wheezing, bibasilar scattered rales RRR no MRG Abd soft ntnd no mass or ascites +bs GU defer MS no joint effusions or deformity Ext 1-2+ bilat pretib LE edema / no wounds or ulcers Neuro is alert, Ox 3 , nf    Na 128  K 4.6  CO2 212  BUN 79  Cr 3.10  Ca 8.6  Alb 2.7   Admit creat 1.47 eGFR 40 BNP 310   WBC 1.6K  Hb 8.7  plt 437 UA 11/1 > prot > 300, 5.0, 1.013, 0-5 rbc/ wbc UNa < 10  UCr 148 Renal US > Negative for hydronephrosis.  Increased cortical echogenicity bilaterally is consistent with medical renal disease.  Impression: 1. Acute on CRF - baseline creat 1.5  range.  Low UNa on admission and CXR pattern could have represented decomp CHF.  Seems to be stabilizing with initiation of diuretics yesterday.  Wt's are still up and pt still appears vol overloaded today.  Would continue w/ diuresis, will ^ lasix slightly and place foley for now. Vanc dc'd appropriately. Repeat UA and UNa.  2. CKD 3 - has proteinuria, may have diab nephropathy, will quantitate.  3. Possible PNA - on levaquin 4. HTN - BP's have been on the low normal side, will decrease BP meds a little.  5. DM on insulin 6. Hx cerebral aneurysm repair    Plan - as above  Kelly Splinter MD [place] pager [phone]   10/16/2017, 1:16 PM

## 2017-10-16 NOTE — Progress Notes (Signed)
PT demonstrated hands on understanding of Flutter device. 

## 2017-10-16 NOTE — Progress Notes (Addendum)
PROGRESS NOTE    Candace Long  CHY:[id number] DOB: 1946-03-03 DOA: 10/13/2017 PCP: Ria Bush, MD    Brief Narrative:  Candace Long  is a 71 y.o. female, w  Dm2, hypertension, hyperlipidemia, ckd stage 3, cerebral aneurysm repair,  C/o feeling poorly. + wheezing, slight confusion.  Slight cough , sob.    Denies fever, chills, cp, palp , n/v, diarrhea, brbpr.  Pt does note some slight cp under her neck with cough.    In ED,   Assessment & Plan:   Principal Problem:   Healthcare-associated pneumonia Active Problems:   Hypertension   Controlled type 2 diabetes mellitus with diabetic nephropathy (HCC)   HLD (hyperlipidemia)   Severe obesity (BMI 35.0-39.9) with comorbidity (HCC)   Chronic kidney disease, stage III (moderate) (HCC)   Vitamin D deficiency   Vitamin B12 deficiency   Elevated troponin   Acute systolic CHF (congestive heart failure) (HCC)   Tachycardia   Acute kidney injury superimposed on CKD ([place]) Stage 3   HCAP (healthcare-associated pneumonia)  #1 healthcare associated pneumonia Patient presented with complaints of feeling poorly, some wheezing, cough, shortness of breath. Chest x-ray done concerning for probable pneumonia. D-dimer was obtained which was elevated, VQ scan which was done showed intermediate probability. Urine Legionella antigen pending. Urine pneumococcus antigen is negative. MRSA PCR is negative. Continue nebulizer treatments, IV levaquin. IV vancomycin and cefepime have been discontinued.   #2 acute renal failure on chronic kidney disease stage III Questionable etiology. Likely secondary to a prerenal azotemia. Likely secondary to acute systolic CHF exacerbation. Renal ultrasound negative for hydronephrosis however does have some echogenicity worrisome for chronic medical renal disease. Renal function worsening in creatinine currently at 2.95 from 1.80 on admission. Patient given some IV Lasix yesterday due to volume overload. Urine sodium  obtained was less than 10. Patient noted to be volume overloaded on examination with lower extremity edema, positive JVD. Repeat chest x-ray with some improvement. Hold this morning's Lasix until patient has been evaluated by nephrology. Strict I's and O's. Daily weights. Due to bumping creatinine will hold Lasix this morning and consult with nephrology for further evaluation and management.  #3 probable acute on chronic systolic heart failure Patient had presented with worsening shortness of breath. Patient noted on physical exam to have lower extremity edema, positive JVD. Current weight is 114.7 kg from 103.7 kg on admission. Cardiac enzymes on admission were slightly minimally elevated and seemed to be plateauing, however likely secondary to demand. 2-D echo from 08/20/2017 with a EF of 40%, diffuse hypokinesis. Patient's metformin has been discontinued on admission. Continue Lasix 40 mg IV every 12 hours. Strict I's and O's. Daily weights.  #4 tachycardia Questionable etiology. Improving. D-dimer was elevated and as such VQ scan was obtained which showed intermediate probability ventilation/perfusion study for PE. Highly doubt if patient does have a PE, more likely symptoms secondary to pneumonia and probable CHF exacerbation. Continue Coreg.  #5 hypertension Stable. Continue current regimen of Coreg, hydralazine, Norvasc, imdur.  #6 hyperlipidemia Fasting lipid panel from 08/20/2017 with a LDL of 67, cholesterol of 118. Continue statin.  #7 cardiomyopathy Currently stable. Continue cardiac regimen of Coreg, hydralazine, Imdur, Norvasc, statin. On diuretics.  #8 diabetes mellitus type 2 Hemoglobin A1c 6.3. CBGs have been ranging from 210-232. Increase Lantus to 30 units daily. Will place on meal coverage insulin Continue sliding scale insulin.     DVT prophylaxis: Lovenox Code Status: Full Family Communication: Updated patient. No family at bedside. Disposition Plan: Likely  home once  medically stable and improved.   Consultants:  Nephrology pending  Procedures:   VQ scan 10/14/2017  Renal ultrasound 10/14/2017  Chest x-ray 10/13/2017, 10/15/2017    Antimicrobials:   IV cefepime 10/13/2017 x 1  IV Levaquin 10/14/2017  IV vancomycin 10/13/2017>>>>> 10/15/2017   Subjective: Patient states improvement with her shortness of breath and wheezing after being started on diuretics. Patient tolerating current diet. No nausea or emesis. No chest pain.   Objective: Vitals:   10/15/17 2251 10/16/17 0123 10/16/17 0613 10/16/17 0836  BP:   111/78   Pulse:   (!) 53   Resp:   20   Temp:   97.7 F (36.5 C)   TempSrc:   Oral   SpO2: 96% 95% 92% 99%  Weight:      Height:        Intake/Output Summary (Last 24 hours) at 10/16/2017 1211 Last data filed at 10/16/2017 0830 Gross per 24 hour  Intake 480 ml  Output 1200 ml  Net -720 ml   Filed Weights   10/14/17 0122 10/15/17 0946  Weight: 103.7 kg (228 lb 9.6 oz) 109.1 kg (240 lb 8 oz)    Examination:  General exam: Appears calm and comfortable  Respiratory system: Decreased coarse breath sounds in the bases. Less Inspiratory and expiratory wheezing.  Cardiovascular system: RRR. No murmurs, rubs, gallops or clicks. Positive JVD. 2+ bilateral hard lower extremity edema.  Gastrointestinal system: Abdomen is soft, nontender, nondistended, no organomegaly, positive bowel sounds.  Central nervous system: Alert and oriented. No focal neurological deficits. Extremities: Symmetric 5 x 5 power. Skin: Some venous stasis changes noted on bilateral shins. Psychiatry: Judgement and insight appear normal. Mood & affect appropriate.     Data Reviewed: I have personally reviewed following labs and imaging studies  CBC: Recent Labs  Lab 10/13/17 2110 10/14/17 0905 10/15/17 0656 10/16/17 0643  WBC 1.7* 1.5* 2.2* 1.6*  NEUTROABS 0.6* 0.8* 1.0* 0.8*  HGB 9.9* 9.4* 8.9* 8.7*  HCT 30.8* 29.9* 27.9* 27.2*  MCV 93.1  94.6 93.9 92.5  PLT 466* 439* 469* 578*   Basic Metabolic Panel: Recent Labs  Lab 10/10/17 1514 10/13/17 2110 10/14/17 0905 10/15/17 0656 10/16/17 0643  NA 141 139 133* 132* 128*  K 4.2 3.6 4.6 4.5 4.6  CL 97 102 103 98* 96*  CO2 26 24 18* 22 21*  GLUCOSE 99 97 295* 208* 164*  BUN 22 31* 38* 63* 79*  CREATININE 1.47* 1.80* 2.00* 2.95* 3.10*  CALCIUM 9.7 9.2 8.4* 8.7* 8.6*  MG  --   --  1.5* 2.7*  --    GFR: Estimated Creatinine Clearance: 22.6 mL/min (A) (by C-G formula based on SCr of 3.1 mg/dL (H)). Liver Function Tests: Recent Labs  Lab 10/13/17 2110  AST 24  ALT 18  ALKPHOS 114  BILITOT 0.9  PROT 7.3  ALBUMIN 3.4*   No results for input(s): LIPASE, AMYLASE in the last 168 hours. No results for input(s): AMMONIA in the last 168 hours. Coagulation Profile: Recent Labs  Lab 10/13/17 2110  INR 1.07   Cardiac Enzymes: Recent Labs  Lab 10/14/17 0158 10/14/17 0629 10/14/17 1314  TROPONINI 0.13* 0.09* 0.08*   BNP (last 3 results) Recent Labs    08/17/17 1831  PROBNP 541.0*   HbA1C: Recent Labs    10/14/17 1757  HGBA1C 6.3*   CBG: Recent Labs  Lab 10/15/17 1115 10/15/17 1709 10/15/17 2202 10/16/17 0756 10/16/17 1108  GLUCAP 295* 266* 232* 210* 228*  Lipid Profile: No results for input(s): CHOL, HDL, LDLCALC, TRIG, CHOLHDL, LDLDIRECT in the last 72 hours. Thyroid Function Tests: No results for input(s): TSH, T4TOTAL, FREET4, T3FREE, THYROIDAB in the last 72 hours. Anemia Panel: No results for input(s): VITAMINB12, FOLATE, FERRITIN, TIBC, IRON, RETICCTPCT in the last 72 hours. Sepsis Labs: Recent Labs  Lab 10/13/17 2119 10/13/17 2353  LATICACIDVEN 1.27 1.71    Recent Results (from the past 240 hour(s))  Culture, blood (Routine x 2)     Status: None (Preliminary result)   Collection Time: 10/13/17  7:10 PM  Result Value Ref Range Status   Specimen Description BLOOD RIGHT ANTECUBITAL  Final   Special Requests   Final    BOTTLES DRAWN  AEROBIC AND ANAEROBIC Blood Culture adequate volume   Culture   Final    NO GROWTH 1 DAY Performed at [facility], [place] [address] [place], [place] [address]    Report Status PENDING  Incomplete  Culture, blood (Routine x 2)     Status: None (Preliminary result)   Collection Time: 10/13/17  9:20 PM  Result Value Ref Range Status   Specimen Description BLOOD LEFT HAND  Final   Special Requests   Final    BOTTLES DRAWN AEROBIC ONLY Blood Culture adequate volume   Culture   Final    NO GROWTH 1 DAY Performed at [facility], [place] [address], [place], [place] [address]    Report Status PENDING  Incomplete  Culture, blood (routine x 2) Call MD if unable to obtain prior to antibiotics being given     Status: None (Preliminary result)   Collection Time: 10/14/17  1:58 AM  Result Value Ref Range Status   Specimen Description BLOOD LEFT HAND  Final   Special Requests IN PEDIATRIC BOTTLE Blood Culture adequate volume  Final   Culture   Final    NO GROWTH 1 DAY Performed at [facility], [place] [address], [place], [place] [address]    Report Status PENDING  Incomplete  Culture, blood (routine x 2) Call MD if unable to obtain prior to antibiotics being given     Status: None (Preliminary result)   Collection Time: 10/14/17  1:58 AM  Result Value Ref Range Status   Specimen Description BLOOD RIGHT HAND  Final   Special Requests   Final    BOTTLES DRAWN AEROBIC AND ANAEROBIC Blood Culture adequate volume   Culture   Final    NO GROWTH 1 DAY Performed at [facility], [place] [address], [place], [place] [address]    Report Status PENDING  Incomplete  MRSA PCR Screening     Status: None   Collection Time: 10/15/17 10:10 AM  Result Value Ref Range Status   MRSA by PCR NEGATIVE NEGATIVE Final    Comment:        The GeneXpert MRSA Assay (FDA approved for NASAL specimens only), is one component of a comprehensive MRSA colonization surveillance program. It is  not intended to diagnose MRSA infection nor to guide or monitor treatment for MRSA infections.          Radiology Studies: Dg Chest 2 View  Result Date: 10/15/2017 CLINICAL DATA:  71 year old female with a history of shortness of breath and altered mental status EXAM: CHEST  2 VIEW COMPARISON:  10/13/2017, 08/19/2017 FINDINGS: Cardiomediastinal silhouette unchanged in size and contour. No evidence of central vascular congestion. Improving aeration of the right chest, with residual mixed interstitial and  airspace opacity. No large pleural effusion. Shunt tubing projects over the right chest wall IMPRESSION: Improving right-sided airspace disease. Electronically Signed   By: Corrie Mckusick D.O.   On: 10/15/2017 15:13   Nm Pulmonary Perf And Vent  Result Date: 10/14/2017 CLINICAL DATA:  Shortness of breath. EXAM: NUCLEAR MEDICINE VENTILATION - PERFUSION LUNG SCAN TECHNIQUE: Ventilation images were obtained in multiple projections using inhaled aerosol Tc-51m DTPA. Perfusion images were obtained in multiple projections after intravenous injection of Tc-54m MAA. RADIOPHARMACEUTICALS:  30.0 mCi Technetium-33m DTPA aerosol inhalation and 4.8 mCi Technetium-59m MAA IV COMPARISON:  Chest radiograph, 10/13/2017 FINDINGS: Ventilation: There are ventilatory defects that are more evident on the left, involving significant portion of the left lower lobe. There is a segmental appearing ventilatory defect involving the right upper lobe. Perfusion: There are perfusion defects which are matched to the ventilatory abnormalities. On the left, there is relative decreased profusion in the lower lobe, that is not clearly segmental. On the right, there is a segmental appearing defect along the posterior upper lobe, which corresponds to an area of consolidation on the previous day's chest radiograph. IMPRESSION: 1. Intermediate probability ventilation-perfusion study for pulmonary thromboembolism. Based on modified PIOPED  criteria, with a moderate to large matching segmental defect noted in the right upper lobe with a matching chest radiographic abnormality (triple match). Ventilatory and perfusion defects on the left are not clearly segmental, and ventilatory defects are larger. Electronically Signed   By: Lajean Manes M.D.   On: 10/14/2017 16:04        Scheduled Meds: . amLODipine  10 mg Oral Daily  . aspirin EC  81 mg Oral Daily  . atorvastatin  40 mg Oral q1800  . budesonide (PULMICORT) nebulizer solution  0.5 mg Nebulization BID  . carvedilol  3.125 mg Oral BID  . enoxaparin (LOVENOX) injection  30 mg Subcutaneous Q24H  . furosemide  40 mg Intravenous Q12H  . hydrALAZINE  25 mg Oral Q8H  . insulin aspart  0-20 Units Subcutaneous TID WC  . insulin aspart  0-5 Units Subcutaneous QHS  . insulin aspart  5 Units Subcutaneous TID WC  . insulin glargine  30 Units Subcutaneous QHS  . ipratropium-albuterol  3 mL Nebulization Q6H  . isosorbide mononitrate  30 mg Oral Daily  . magnesium oxide  400 mg Oral BID  . methylPREDNISolone (SOLU-MEDROL) injection  60 mg Intravenous Q12H  . vitamin B-12  1,000 mcg Oral Daily   Continuous Infusions: . levofloxacin (LEVAQUIN) IV Stopped (10/16/17 0636)     LOS: 3 days    Time spent: 48 minutes    THOMPSON,DANIEL, MD [facility] Pager [phone]  If 7PM-7AM, please contact night-coverage [email or web address] Password [email or web address] 10/16/2017, 12:11 PM

## 2017-10-17 ENCOUNTER — Other Ambulatory Visit: Payer: Self-pay

## 2017-10-17 LAB — CBC WITH DIFFERENTIAL/PLATELET
BASOS PCT: 1 %
Basophils Absolute: 0 10*3/uL (ref 0.0–0.1)
EOS ABS: 0 10*3/uL (ref 0.0–0.7)
Eosinophils Relative: 0 %
HCT: 27.5 % — ABNORMAL LOW (ref 36.0–46.0)
Hemoglobin: 8.8 g/dL — ABNORMAL LOW (ref 12.0–15.0)
Lymphocytes Relative: 33 %
Lymphs Abs: 0.5 10*3/uL — ABNORMAL LOW (ref 0.7–4.0)
MCH: 28.9 pg (ref 26.0–34.0)
MCHC: 32 g/dL (ref 30.0–36.0)
MCV: 90.5 fL (ref 78.0–100.0)
MONO ABS: 0.1 10*3/uL (ref 0.1–1.0)
Monocytes Relative: 4 %
NEUTROS ABS: 1 10*3/uL — AB (ref 1.7–7.7)
Neutrophils Relative %: 62 %
PLATELETS: 441 10*3/uL — AB (ref 150–400)
RBC: 3.04 MIL/uL — ABNORMAL LOW (ref 3.87–5.11)
RDW: 14.7 % (ref 11.5–15.5)
WBC: 1.6 10*3/uL — ABNORMAL LOW (ref 4.0–10.5)

## 2017-10-17 LAB — GLUCOSE, CAPILLARY
GLUCOSE-CAPILLARY: 297 mg/dL — AB (ref 65–99)
Glucose-Capillary: 185 mg/dL — ABNORMAL HIGH (ref 65–99)
Glucose-Capillary: 252 mg/dL — ABNORMAL HIGH (ref 65–99)
Glucose-Capillary: 294 mg/dL — ABNORMAL HIGH (ref 65–99)

## 2017-10-17 LAB — RENAL FUNCTION PANEL
Albumin: 3.2 g/dL — ABNORMAL LOW (ref 3.5–5.0)
Anion gap: 11 (ref 5–15)
BUN: 98 mg/dL — ABNORMAL HIGH (ref 6–20)
CALCIUM: 8.7 mg/dL — AB (ref 8.9–10.3)
CO2: 20 mmol/L — AB (ref 22–32)
CREATININE: 3.02 mg/dL — AB (ref 0.44–1.00)
Chloride: 99 mmol/L — ABNORMAL LOW (ref 101–111)
GFR calc Af Amer: 17 mL/min — ABNORMAL LOW (ref >60)
GFR calc non Af Amer: 15 mL/min — ABNORMAL LOW (ref >60)
GLUCOSE: 181 mg/dL — AB (ref 65–99)
Phosphorus: 4.8 mg/dL — ABNORMAL HIGH (ref 2.5–4.6)
Potassium: 4.5 mmol/L (ref 3.5–5.1)
SODIUM: 130 mmol/L — AB (ref 135–145)

## 2017-10-17 LAB — LEGIONELLA PNEUMOPHILA SEROGP 1 UR AG: L. pneumophila Serogp 1 Ur Ag: NEGATIVE

## 2017-10-17 MED ORDER — LIVING WELL WITH DIABETES BOOK
Freq: Once | Status: AC
  Administered 2017-10-17: 18:00:00
  Filled 2017-10-17: qty 1

## 2017-10-17 MED ORDER — INSULIN ASPART 100 UNIT/ML ~~LOC~~ SOLN
10.0000 [IU] | Freq: Three times a day (TID) | SUBCUTANEOUS | Status: DC
  Administered 2017-10-17 – 2017-10-21 (×9): 10 [IU] via SUBCUTANEOUS

## 2017-10-17 MED ORDER — INSULIN GLARGINE 100 UNIT/ML ~~LOC~~ SOLN
34.0000 [IU] | Freq: Every day | SUBCUTANEOUS | Status: DC
  Administered 2017-10-17 – 2017-10-19 (×3): 34 [IU] via SUBCUTANEOUS
  Filled 2017-10-17 (×4): qty 0.34

## 2017-10-17 MED ORDER — LEVOFLOXACIN 750 MG PO TABS
750.0000 mg | ORAL_TABLET | ORAL | Status: DC
  Administered 2017-10-18 – 2017-10-20 (×2): 750 mg via ORAL
  Filled 2017-10-17 (×3): qty 1

## 2017-10-17 MED ORDER — IPRATROPIUM-ALBUTEROL 0.5-2.5 (3) MG/3ML IN SOLN
3.0000 mL | Freq: Three times a day (TID) | RESPIRATORY_TRACT | Status: DC
  Administered 2017-10-17 – 2017-10-19 (×7): 3 mL via RESPIRATORY_TRACT
  Filled 2017-10-17 (×9): qty 3

## 2017-10-17 NOTE — Progress Notes (Signed)
Patient ID: Candace Long, female   DOB: [birth year], 71 y.o.   MRN: [id number] [facility] Progress Note   Assessment/ Plan:   1. Acute on CRF - baseline creatinine appears to be around 1.5 (EGFR 42 mL/minute).   History, timeline of events and available database including recently done labs are indicative of prerenal acute kidney injury with low urine sodium (clinical exam points to CHF exacerbation being the etiology). Decent urine output noted overnight in response to furosemide 240 mg yesterday-now getting furosemide 80 mg twice a day intravenously-would continue this dose and plan to switch to oral diuretics as she continues to show clinical improvement. 2. 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 Ultrasound consistent with chronic kidney disease-increased echogenicity bilaterally.   3. Possible community-acquired pneumonia- on levaquin and remains afebrile. Suspect that both CHF exacerbation and lower respiratory tract infection may have been contributing to presentation symptoms. 4. Hyponatremia: Secondary to free water excretion defect of acute kidney injury as well as the impact of CHF exacerbation/ADH activation. Monitor with ongoing diuretics. 5. HTN - BP's have been on the low normal side, will decrease BP meds a little.  6. DM on insulin 7. Hx cerebral aneurysm repair   Subjective:   Reports to be feeling fair and has a sensation of the need to void her bladder (has Foley catheter in situ). She denies any chest pain.    Objective:   BP 122/78 (BP Location: Right Arm)   Pulse 68   Temp (!) 97.5 F (36.4 C) (Oral)   Resp 20   Ht 5' 9"  (1.753 m)   Wt 115.8 kg (255 lb 6.4 oz)   SpO2 97%   BMI 37.72 kg/m   Intake/Output Summary (Last 24 hours) at 10/17/2017 1158 Last data filed at 10/17/2017 4782 Gross per 24 hour   Intake 720 ml  Output 1200 ml  Net -480 ml   Weight change: 5.579 kg (12 lb 4.8 oz)  Physical Exam: Gen: Comfortably resting in recliner, eating lunch CVS: Pulse regular rhythm, normal rate, S1 and S2 normal Resp: Intermittent bilateral expiratory wheeze audible with fine rales right base Abd: Soft, obese, nontender Ext: 1+ bilateral lower extremity edema  Imaging: Dg Chest 2 View  Result Date: 10/15/2017 CLINICAL DATA:  71 year old female with a history of shortness of breath and altered mental status EXAM: CHEST  2 VIEW COMPARISON:  10/13/2017, 08/19/2017 FINDINGS: Cardiomediastinal silhouette unchanged in size and contour. No evidence of central vascular congestion. Improving aeration of the right chest, with residual mixed interstitial and airspace opacity. No large pleural effusion. Shunt tubing projects over the right chest wall IMPRESSION: Improving right-sided airspace disease. Electronically Signed   By: Corrie Mckusick D.O.   On: 10/15/2017 15:13    Labs: BMET Recent Labs  Lab 10/10/17 1514 10/13/17 2110 10/14/17 0905 10/15/17 0656 10/16/17 0643 10/17/17 0555  NA 141 139 133* 132* 128* 130*  K 4.2 3.6 4.6 4.5 4.6 4.5  CL 97 102 103 98* 96* 99*  CO2 26 24 18* 22 21* 20*  GLUCOSE 99 97 295* 208* 164* 181*  BUN 22 31* 38* 63* 79* 98*  CREATININE 1.47* 1.80* 2.00* 2.95* 3.10* 3.02*  CALCIUM 9.7 9.2 8.4* 8.7* 8.6* 8.7*  PHOS  --   --   --   --   --  4.8*   CBC Recent Labs  Lab 10/14/17  3568 10/15/17 0656 10/16/17 0643 10/17/17 0555  WBC 1.5* 2.2* 1.6* 1.6*  NEUTROABS 0.8* 1.0* 0.8* 1.0*  HGB 9.4* 8.9* 8.7* 8.8*  HCT 29.9* 27.9* 27.2* 27.5*  MCV 94.6 93.9 92.5 90.5  PLT 439* 469* 437* 441*    Medications:    . aspirin EC  81 mg Oral Daily  . atorvastatin  40 mg Oral q1800  . budesonide (PULMICORT) nebulizer solution  0.5 mg Nebulization BID  . carvedilol  3.125 mg Oral BID  . enoxaparin (LOVENOX) injection  30 mg Subcutaneous Q24H  . furosemide  80 mg  Intravenous BID  . hydrALAZINE  10 mg Oral Q8H  . insulin aspart  0-20 Units Subcutaneous TID WC  . insulin aspart  0-5 Units Subcutaneous QHS  . insulin aspart  5 Units Subcutaneous TID WC  . insulin glargine  30 Units Subcutaneous QHS  . ipratropium-albuterol  3 mL Nebulization TID  . isosorbide mononitrate  30 mg Oral Daily  . magnesium oxide  400 mg Oral BID  . methylPREDNISolone (SOLU-MEDROL) injection  60 mg Intravenous Q12H  . vitamin B-12  1,000 mcg Oral Daily   Elmarie Shiley, MD 10/17/2017, 11:58 AM

## 2017-10-17 NOTE — Progress Notes (Signed)
Nutrition Brief Note  RD attempted to educate patient this afternoon.  Patient remains confused, unable to participate.  Will attempt to see patient once mental status improves.   Current diet order is heart healthy/carb modified, patient is consuming approximately 100% of meals at this time. Labs and medications reviewed.   If nutrition issues arise, please consult RD.   Mariana Single RD, LDN Clinical Nutrition Pager # [phone]

## 2017-10-17 NOTE — Progress Notes (Signed)
Inpatient Diabetes Program Recommendations  AACE/ADA: New Consensus Statement on Inpatient Glycemic Control (2015)  Target Ranges:  Prepandial:   less than 140 mg/dL      Peak postprandial:   less than 180 mg/dL (1-2 hours)      Critically ill patients:  140 - 180 mg/dL   Lab Results  Component Value Date   GLUCAP 185 (H) 10/17/2017   HGBA1C 6.3 (H) 10/14/2017    Review of Glycemic Control  Diabetes history: DM2 Outpatient Diabetes medications: Lantus 30 units QHS, metformin 1000 mg bid Current orders for Inpatient glycemic control: Lantus 30 units QHS, Novolog 0-20 units tidwc and hs + 5 units tidwc Pt is eating well in hospital. States she lives with son and daughter-in-law and they cook for her. Son gives her Lantus injection in abdomen every night. States she rarely has hypoglycemia, maybe 1x/month. Treats with peppermint candy and juice.  Inpatient Diabetes Program Recommendations:    Will order Living Well With Diabetes book. Encourage pt to view diabetes videos on pt ed channel. Agree with Novolog 5 units tidwc for meal coverage insulin. May need to decrease Novolog to 0-15 units tidwc with CKD.  Will follow closely.  Thank you. Lorenda Peck, RD, LDN, CDE Inpatient Diabetes Coordinator [phone]

## 2017-10-17 NOTE — Progress Notes (Signed)
PROGRESS NOTE    Candace Long  FGH:[id number] DOB: September 20, 1946 DOA: 10/13/2017 PCP: Ria Bush, MD    Brief Narrative:  Candace Long  is a 71 y.o. female, w  Dm2, hypertension, hyperlipidemia, ckd stage 3, cerebral aneurysm repair,  C/o feeling poorly. + wheezing, slight confusion.  Slight cough , sob.    Denies fever, chills, cp, palp , n/v, diarrhea, brbpr.  Pt does note some slight cp under her neck with cough.    In ED,   Assessment & Plan:   Principal Problem:   Healthcare-associated pneumonia Active Problems:   Hypertension   Controlled type 2 diabetes mellitus with diabetic nephropathy (HCC)   HLD (hyperlipidemia)   Severe obesity (BMI 35.0-39.9) with comorbidity (HCC)   Chronic kidney disease, stage III (moderate) (HCC)   Vitamin D deficiency   Vitamin B12 deficiency   Elevated troponin   Acute systolic CHF (congestive heart failure) (HCC)   Tachycardia   Acute kidney injury superimposed on CKD ([place]) Stage 3   HCAP (healthcare-associated pneumonia)  #1 healthcare associated pneumonia Patient presented with complaints of feeling poorly, some wheezing, cough, shortness of breath. Chest x-ray done concerning for probable pneumonia. D-dimer was obtained which was elevated, VQ scan which was done showed intermediate probability. Urine Legionella antigen negative. Urine pneumococcus antigen is negative. MRSA PCR is negative. Continue nebulizer treatments, IV vancomycin and cefepime have been discontinued. Will change IV Levaquin to oral Levaquin.  #2 acute renal failure on chronic kidney disease stage III Questionable etiology. Likely secondary to a prerenal azotemia. Likely secondary to acute systolic CHF exacerbation. Renal ultrasound negative for hydronephrosis however does have some echogenicity worrisome for chronic medical renal disease. Renal function worsening in creatinine currently at 3.02 from 3.10 from 2.95 from 1.80 on admission. Patient given some IV Lasix  due to volume overload. Urine sodium obtained was less than 10. Patient noted to be volume overloaded on examination with lower extremity edema, positive JVD. Repeat chest x-ray with some improvement. Patient seen in consultation by nephrology was in agreement with diuresis. Lasix dose has been increased to 80 mg IV every 12 hours. Renal function seems to be plateauing however has not worsened. Strict I's and O's. Daily weights. Nephrology has consulted on the patient and recommended continuing IV Lasix for now. Follow.   #3 probable acute on chronic systolic heart failure Patient had presented with worsening shortness of breath. Patient noted on physical exam to have lower extremity edema, positive JVD. Current weight is 115.8 kg from 114.7 kg from 103.7 kg on admission. Cardiac enzymes on admission were slightly minimally elevated and seemed to be plateauing, however likely secondary to demand. 2-D echo from 08/20/2017 with a EF of 40%, diffuse hypokinesis. Patient's metformin has been discontinued on admission. Patient with a urine output of 1.2 L over the past 24 hours. Lasix dose has been increased per nephrology to 80 mg IV every 12 hours. Strict I's and O's. Daily weights.  #4 tachycardia Questionable etiology. Resolved. D-dimer was elevated and as such VQ scan was obtained which showed intermediate probability ventilation/perfusion study for PE. Highly doubt if patient does have a PE, more likely symptoms secondary to pneumonia and probable CHF exacerbation. Continue Coreg.  #5 hypertension Stable. Doses of antihypertensive medications has been adjusted per nephrology for better renal perfusion. Continue current regimen of Coreg, hydralazine, Norvasc, imdur.  #6 hyperlipidemia Fasting lipid panel from 08/20/2017 with a LDL of 67, cholesterol of 118. Continue statin.  #7 cardiomyopathy Currently stable. Continue cardiac  regimen of Coreg, hydralazine, Imdur, Norvasc, statin. On diuretics.  #8  diabetes mellitus type 2 Hemoglobin A1c 6.3. CBGs have been ranging from 185-297. Increase Lantus to 34 units daily. Increase meal coverage insulin to 10 units 3 times a day. Continue sliding scale insulin.     DVT prophylaxis: Lovenox Code Status: Full Family Communication: Updated patient. No family at bedside. Disposition Plan: Likely home once medically stable and improved.   Consultants:  Nephrology: Dr Melvia Heaps  Procedures:   VQ scan 10/14/2017  Renal ultrasound 10/14/2017  Chest x-ray 10/13/2017, 10/15/2017    Antimicrobials:   IV cefepime 10/13/2017 x 1  IV Levaquin 10/14/2017>>>>> oral Levaquin 10/17/2017  IV vancomycin 10/13/2017>>>>> 10/15/2017   Subjective: Patient sitting up in chair. States shortness of breath has improved significantly since admission. No chest pain. No shortness of breath. No nausea or emesis  Objective: Vitals:   10/15/17 2251 10/16/17 0123 10/16/17 0613 10/16/17 0836  BP:   111/78   Pulse:   (!) 53   Resp:   20   Temp:   97.7 F (36.5 C)   TempSrc:   Oral   SpO2: 96% 95% 92% 99%  Weight:      Height:        Intake/Output Summary (Last 24 hours) at 10/16/2017 1211 Last data filed at 10/16/2017 0830 Gross per 24 hour  Intake 480 ml  Output 1200 ml  Net -720 ml   Filed Weights   10/14/17 0122 10/15/17 0946  Weight: 103.7 kg (228 lb 9.6 oz) 109.1 kg (240 lb 8 oz)    Examination:  General exam: NAD Respiratory system: Decreased coarse breath sounds in the bases. Less Inspiratory and expiratory wheezing improving.  Cardiovascular system: RRR. No murmurs, rubs, gallops or clicks. 1+ bilateral hard lower extremity edema.  Gastrointestinal system: Abdomen is soft, nontender, nondistended, no organomegaly, positive bowel sounds.  Central nervous system: Alert and oriented. No focal neurological deficits. Extremities: Symmetric 5 x 5 power. Skin: Some venous stasis changes noted on bilateral shins. Psychiatry: Judgement and  insight appear fair. Mood & affect appropriate.     Data Reviewed: I have personally reviewed following labs and imaging studies  CBC: Recent Labs  Lab 10/13/17 2110 10/14/17 0905 10/15/17 0656 10/16/17 0643  WBC 1.7* 1.5* 2.2* 1.6*  NEUTROABS 0.6* 0.8* 1.0* 0.8*  HGB 9.9* 9.4* 8.9* 8.7*  HCT 30.8* 29.9* 27.9* 27.2*  MCV 93.1 94.6 93.9 92.5  PLT 466* 439* 469* 789*   Basic Metabolic Panel: Recent Labs  Lab 10/10/17 1514 10/13/17 2110 10/14/17 0905 10/15/17 0656 10/16/17 0643  NA 141 139 133* 132* 128*  K 4.2 3.6 4.6 4.5 4.6  CL 97 102 103 98* 96*  CO2 26 24 18* 22 21*  GLUCOSE 99 97 295* 208* 164*  BUN 22 31* 38* 63* 79*  CREATININE 1.47* 1.80* 2.00* 2.95* 3.10*  CALCIUM 9.7 9.2 8.4* 8.7* 8.6*  MG  --   --  1.5* 2.7*  --    GFR: Estimated Creatinine Clearance: 22.6 mL/min (A) (by C-G formula based on SCr of 3.1 mg/dL (H)). Liver Function Tests: Recent Labs  Lab 10/13/17 2110  AST 24  ALT 18  ALKPHOS 114  BILITOT 0.9  PROT 7.3  ALBUMIN 3.4*   No results for input(s): LIPASE, AMYLASE in the last 168 hours. No results for input(s): AMMONIA in the last 168 hours. Coagulation Profile: Recent Labs  Lab 10/13/17 2110  INR 1.07   Cardiac Enzymes: Recent Labs  Lab 10/14/17  0158 10/14/17 0629 10/14/17 1314  TROPONINI 0.13* 0.09* 0.08*   BNP (last 3 results) Recent Labs    08/17/17 1831  PROBNP 541.0*   HbA1C: Recent Labs    10/14/17 1757  HGBA1C 6.3*   CBG: Recent Labs  Lab 10/15/17 1115 10/15/17 1709 10/15/17 2202 10/16/17 0756 10/16/17 1108  GLUCAP 295* 266* 232* 210* 228*   Lipid Profile: No results for input(s): CHOL, HDL, LDLCALC, TRIG, CHOLHDL, LDLDIRECT in the last 72 hours. Thyroid Function Tests: No results for input(s): TSH, T4TOTAL, FREET4, T3FREE, THYROIDAB in the last 72 hours. Anemia Panel: No results for input(s): VITAMINB12, FOLATE, FERRITIN, TIBC, IRON, RETICCTPCT in the last 72 hours. Sepsis Labs: Recent Labs    Lab 10/13/17 2119 10/13/17 2353  LATICACIDVEN 1.27 1.71    Recent Results (from the past 240 hour(s))  Culture, blood (Routine x 2)     Status: None (Preliminary result)   Collection Time: 10/13/17  7:10 PM  Result Value Ref Range Status   Specimen Description BLOOD RIGHT ANTECUBITAL  Final   Special Requests   Final    BOTTLES DRAWN AEROBIC AND ANAEROBIC Blood Culture adequate volume   Culture   Final    NO GROWTH 1 DAY Performed at [facility], [place] [address], [place], [place] [address]    Report Status PENDING  Incomplete  Culture, blood (Routine x 2)     Status: None (Preliminary result)   Collection Time: 10/13/17  9:20 PM  Result Value Ref Range Status   Specimen Description BLOOD LEFT HAND  Final   Special Requests   Final    BOTTLES DRAWN AEROBIC ONLY Blood Culture adequate volume   Culture   Final    NO GROWTH 1 DAY Performed at [facility], [place] [address], [place], [place] [address]    Report Status PENDING  Incomplete  Culture, blood (routine x 2) Call MD if unable to obtain prior to antibiotics being given     Status: None (Preliminary result)   Collection Time: 10/14/17  1:58 AM  Result Value Ref Range Status   Specimen Description BLOOD LEFT HAND  Final   Special Requests IN PEDIATRIC BOTTLE Blood Culture adequate volume  Final   Culture   Final    NO GROWTH 1 DAY Performed at [facility], [place] [address], [place], [place] [address]    Report Status PENDING  Incomplete  Culture, blood (routine x 2) Call MD if unable to obtain prior to antibiotics being given     Status: None (Preliminary result)   Collection Time: 10/14/17  1:58 AM  Result Value Ref Range Status   Specimen Description BLOOD RIGHT HAND  Final   Special Requests   Final    BOTTLES DRAWN AEROBIC AND ANAEROBIC Blood Culture adequate volume   Culture   Final    NO GROWTH 1 DAY Performed at [facility], [place] [address] [place], [place] [address]    Report Status  PENDING  Incomplete  MRSA PCR Screening     Status: None   Collection Time: 10/15/17 10:10 AM  Result Value Ref Range Status   MRSA by PCR NEGATIVE NEGATIVE Final    Comment:        The GeneXpert MRSA Assay (FDA approved for NASAL specimens only), is one component of a comprehensive MRSA colonization surveillance program. It is not intended to diagnose MRSA infection nor to guide or monitor treatment for MRSA infections.  Radiology Studies: Dg Chest 2 View  Result Date: 10/15/2017 CLINICAL DATA:  71 year old female with a history of shortness of breath and altered mental status EXAM: CHEST  2 VIEW COMPARISON:  10/13/2017, 08/19/2017 FINDINGS: Cardiomediastinal silhouette unchanged in size and contour. No evidence of central vascular congestion. Improving aeration of the right chest, with residual mixed interstitial and airspace opacity. No large pleural effusion. Shunt tubing projects over the right chest wall IMPRESSION: Improving right-sided airspace disease. Electronically Signed   By: Corrie Mckusick D.O.   On: 10/15/2017 15:13   Nm Pulmonary Perf And Vent  Result Date: 10/14/2017 CLINICAL DATA:  Shortness of breath. EXAM: NUCLEAR MEDICINE VENTILATION - PERFUSION LUNG SCAN TECHNIQUE: Ventilation images were obtained in multiple projections using inhaled aerosol Tc-59m DTPA. Perfusion images were obtained in multiple projections after intravenous injection of Tc-56m MAA. RADIOPHARMACEUTICALS:  30.0 mCi Technetium-59m DTPA aerosol inhalation and 4.8 mCi Technetium-35m MAA IV COMPARISON:  Chest radiograph, 10/13/2017 FINDINGS: Ventilation: There are ventilatory defects that are more evident on the left, involving significant portion of the left lower lobe. There is a segmental appearing ventilatory defect involving the right upper lobe. Perfusion: There are perfusion defects which are matched to the ventilatory abnormalities. On the left, there is relative decreased profusion in  the lower lobe, that is not clearly segmental. On the right, there is a segmental appearing defect along the posterior upper lobe, which corresponds to an area of consolidation on the previous day's chest radiograph. IMPRESSION: 1. Intermediate probability ventilation-perfusion study for pulmonary thromboembolism. Based on modified PIOPED criteria, with a moderate to large matching segmental defect noted in the right upper lobe with a matching chest radiographic abnormality (triple match). Ventilatory and perfusion defects on the left are not clearly segmental, and ventilatory defects are larger. Electronically Signed   By: Lajean Manes M.D.   On: 10/14/2017 16:04        Scheduled Meds: . amLODipine  10 mg Oral Daily  . aspirin EC  81 mg Oral Daily  . atorvastatin  40 mg Oral q1800  . budesonide (PULMICORT) nebulizer solution  0.5 mg Nebulization BID  . carvedilol  3.125 mg Oral BID  . enoxaparin (LOVENOX) injection  30 mg Subcutaneous Q24H  . furosemide  40 mg Intravenous Q12H  . hydrALAZINE  25 mg Oral Q8H  . insulin aspart  0-20 Units Subcutaneous TID WC  . insulin aspart  0-5 Units Subcutaneous QHS  . insulin aspart  5 Units Subcutaneous TID WC  . insulin glargine  30 Units Subcutaneous QHS  . ipratropium-albuterol  3 mL Nebulization Q6H  . isosorbide mononitrate  30 mg Oral Daily  . magnesium oxide  400 mg Oral BID  . methylPREDNISolone (SOLU-MEDROL) injection  60 mg Intravenous Q12H  . vitamin B-12  1,000 mcg Oral Daily   Continuous Infusions: . levofloxacin (LEVAQUIN) IV Stopped (10/16/17 0636)     LOS: 3 days    Time spent: 4 minutes    THOMPSON,DANIEL, MD [facility] Pager [phone]  If 7PM-7AM, please contact night-coverage [email or web address] Password [email or web address] 10/16/2017, 12:11 PM

## 2017-10-17 NOTE — Care Management Note (Signed)
Case Management Note  Patient Details  Name: Alizon Schmeling MRN: [id number] Date of Birth: [birth year]  Subjective/Objective:                  71 y.o. year-old w/ hx of IDDM, HTN, syst/ diast CHF, CKD 3, hx cerebral aneursym repair, HL who presented to ED on 11/1 3 days ago with AMS and SOB.  Had been confused for 3 d per family.  CXR showed R sided infiltrates, temp was 100.6 and pt was admitted and started on IV abx for PNA.  Creat on admission was 1.47 and has steadily ^'d to 2.9 yest and 3.1 today.  Asked to see for acute/ chron renal failure.     Action/Plan: Date:  October 17, 2017 Chart reviewed for concurrent status and case management needs.  Will continue to follow patient progress.  Discharge Planning: following for needs  Expected discharge date: October 20, 2017  Velva Harman, BSN, [place], [place]   Expected Discharge Date:                  Expected Discharge Plan:  Home/Self Care  In-House Referral:     Discharge planning Services  CM Consult  Post Acute Care Choice:    Choice offered to:     DME Arranged:    DME Agency:     HH Arranged:    HH Agency:     Status of Service:  In process, will continue to follow  If discussed at Long Length of Stay Meetings, dates discussed:    Additional Comments:  Leeroy Cha, RN 10/17/2017, 10:39 AM

## 2017-10-17 NOTE — Progress Notes (Signed)
PT demonstrated hands on understanding of Flutter device. 

## 2017-10-18 ENCOUNTER — Other Ambulatory Visit: Payer: Self-pay | Admitting: Physician Assistant

## 2017-10-18 LAB — GLUCOSE, CAPILLARY
GLUCOSE-CAPILLARY: 182 mg/dL — AB (ref 65–99)
GLUCOSE-CAPILLARY: 218 mg/dL — AB (ref 65–99)
Glucose-Capillary: 193 mg/dL — ABNORMAL HIGH (ref 65–99)
Glucose-Capillary: 207 mg/dL — ABNORMAL HIGH (ref 65–99)

## 2017-10-18 LAB — BASIC METABOLIC PANEL
ANION GAP: 12 (ref 5–15)
BUN: 111 mg/dL — ABNORMAL HIGH (ref 6–20)
CHLORIDE: 99 mmol/L — AB (ref 101–111)
CO2: 23 mmol/L (ref 22–32)
Calcium: 8.9 mg/dL (ref 8.9–10.3)
Creatinine, Ser: 2.84 mg/dL — ABNORMAL HIGH (ref 0.44–1.00)
GFR calc non Af Amer: 16 mL/min — ABNORMAL LOW (ref >60)
GFR, EST AFRICAN AMERICAN: 18 mL/min — AB (ref >60)
GLUCOSE: 174 mg/dL — AB (ref 65–99)
Potassium: 4.3 mmol/L (ref 3.5–5.1)
Sodium: 134 mmol/L — ABNORMAL LOW (ref 135–145)

## 2017-10-18 LAB — IRON AND TIBC
IRON: 71 ug/dL (ref 28–170)
SATURATION RATIOS: 24 % (ref 10.4–31.8)
TIBC: 295 ug/dL (ref 250–450)
UIBC: 224 ug/dL

## 2017-10-18 LAB — CBC WITH DIFFERENTIAL/PLATELET
BASOS ABS: 0 10*3/uL (ref 0.0–0.1)
Basophils Relative: 0 %
EOS PCT: 0 %
Eosinophils Absolute: 0 10*3/uL (ref 0.0–0.7)
HEMATOCRIT: 27.8 % — AB (ref 36.0–46.0)
HEMOGLOBIN: 9.2 g/dL — AB (ref 12.0–15.0)
LYMPHS PCT: 33 %
Lymphs Abs: 0.5 10*3/uL — ABNORMAL LOW (ref 0.7–4.0)
MCH: 29.9 pg (ref 26.0–34.0)
MCHC: 33.1 g/dL (ref 30.0–36.0)
MCV: 90.3 fL (ref 78.0–100.0)
MONOS PCT: 2 %
Monocytes Absolute: 0 10*3/uL — ABNORMAL LOW (ref 0.1–1.0)
NEUTROS ABS: 1.1 10*3/uL — AB (ref 1.7–7.7)
Neutrophils Relative %: 65 %
Platelets: 495 10*3/uL — ABNORMAL HIGH (ref 150–400)
RBC: 3.08 MIL/uL — ABNORMAL LOW (ref 3.87–5.11)
RDW: 14.8 % (ref 11.5–15.5)
WBC: 1.6 10*3/uL — ABNORMAL LOW (ref 4.0–10.5)

## 2017-10-18 LAB — FERRITIN: FERRITIN: 137 ng/mL (ref 11–307)

## 2017-10-18 MED ORDER — FUROSEMIDE 10 MG/ML IJ SOLN
40.0000 mg | Freq: Two times a day (BID) | INTRAMUSCULAR | Status: AC
  Administered 2017-10-18 – 2017-10-20 (×5): 40 mg via INTRAVENOUS
  Filled 2017-10-18 (×6): qty 4

## 2017-10-18 MED ORDER — PREDNISONE 50 MG PO TABS
60.0000 mg | ORAL_TABLET | Freq: Every day | ORAL | Status: DC
  Administered 2017-10-19: 60 mg via ORAL
  Filled 2017-10-18: qty 1

## 2017-10-18 NOTE — Progress Notes (Signed)
PROGRESS NOTE    Candace Long  WRU:[id number] DOB: [birth year] DOA: 10/13/2017 PCP: Ria Bush, MD    Brief Narrative:  Candace Long  is a 71 y.o. female, w  Dm2, hypertension, hyperlipidemia, ckd stage 3, cerebral aneurysm repair,  C/o feeling poorly. + wheezing, slight confusion.  Slight cough , sob.    Denies fever, chills, cp, palp , n/v, diarrhea, brbpr.  Pt does note some slight cp under her neck with cough.    In ED,   Assessment & Plan:   Principal Problem:   Healthcare-associated pneumonia Active Problems:   Hypertension   Controlled type 2 diabetes mellitus with diabetic nephropathy (HCC)   HLD (hyperlipidemia)   Severe obesity (BMI 35.0-39.9) with comorbidity (HCC)   Chronic kidney disease, stage III (moderate) (HCC)   Vitamin D deficiency   Vitamin B12 deficiency   Elevated troponin   Acute systolic CHF (congestive heart failure) (HCC)   Tachycardia   Acute kidney injury superimposed on CKD ([place]) Stage 3   HCAP (healthcare-associated pneumonia)  #1 healthcare associated pneumonia Patient presented with complaints of feeling poorly, some wheezing, cough, shortness of breath. Chest x-ray done concerning for probable pneumonia. D-dimer was obtained which was elevated, VQ scan which was done showed intermediate probability. Urine Legionella antigen negative. Urine pneumococcus antigen is negative. MRSA PCR is negative. Continue nebulizer treatments, IV vancomycin and cefepime have been discontinued. Patient has been transitioned to oral Levaquin to complete course of treatment. Taper steroids.  #2 acute renal failure on chronic kidney disease stage III--- baseline creatinine 1.5 Likely secondary to a prerenal azotemia, secondary to acute systolic CHF exacerbation. Renal ultrasound negative for hydronephrosis however does have some echogenicity worrisome for chronic medical renal disease. Renal function initially worsening, however creatinine started to trend back  down and currently at 2.84 from 3.02 from 3.10 from 2.95 from 1.80 on admission. Patient given some IV Lasix due to volume overload. Urine sodium obtained was less than 10. Patient noted to be volume overloaded on examination with lower extremity edema, positive JVD. Repeat chest x-ray with some improvement. Patient seen in consultation by nephrology was in agreement with diuresis. Lasix dose was increased to 80 mg IV every 12 hours. Patient would good urine output of 2.8 L over the past 24 hours. IV Lasix has been decreased to 40 mg IV every 12 hours per nephrology. Strict I's and O's. Daily weights. Will likely need to follow-up with nephrology in the outpatient setting.   #3 probable acute on chronic systolic heart failure Patient had presented with worsening shortness of breath. Patient noted on physical exam to have lower extremity edema, positive JVD. Current weight is 115.8 kg from 114.7 kg from 103.7 kg on admission. Cardiac enzymes on admission were slightly minimally elevated and seemed to be plateauing, however likely secondary to demand. 2-D echo from 08/20/2017 with a EF of 40%, diffuse hypokinesis. Patient's metformin has been discontinued on admission. Patient with a urine output of 2.8 L over the past 24 hours. Lasix dose was initially increased per nephrology to 80 mg IV every 12 hours. Patient improving clinically. IV Lasix has been decreased per nephrology to 40 mg IV every 12 hours. Strict I's and O's. Daily weights.  #4 tachycardia Questionable etiology. Resolved. D-dimer was elevated and as such VQ scan was obtained which showed intermediate probability ventilation/perfusion study for PE. Highly doubt if patient does have a PE, more likely symptoms secondary to pneumonia and probable CHF exacerbation. Tachycardia improved. Continue Coreg.  #5  hypertension Stable. Doses of antihypertensive medications has been adjusted per nephrology for better renal perfusion. Continue current regimen  of Coreg, hydralazine, Norvasc, imdur.  #6 hyperlipidemia Fasting lipid panel from 08/20/2017 with a LDL of 67, cholesterol of 118. Continue statin.  #7 cardiomyopathy Currently stable. Continue cardiac regimen of Coreg, hydralazine, Imdur, Norvasc, statin. On diuretics.  #8 diabetes mellitus type 2 Hemoglobin A1c 6.3. CBGs have been ranging from 182-218. Increased Lantus to 34 units daily. Increased meal coverage insulin to 10 units 3 times a day. Continue sliding scale insulin.     DVT prophylaxis: Lovenox Code Status: Full Family Communication: Updated patient. No family at bedside. Disposition Plan: Likely home once medically stable and improved.   Consultants:  Nephrology: Dr Melvia Heaps  Procedures:   VQ scan 10/14/2017  Renal ultrasound 10/14/2017  Chest x-ray 10/13/2017, 10/15/2017    Antimicrobials:   IV cefepime 10/13/2017 x 1  IV Levaquin 10/14/2017>>>>> oral Levaquin 10/17/2017  IV vancomycin 10/13/2017>>>>> 10/15/2017   Subjective: Patient sitting up in chair. Denies chest pain. States shortness of breath has improved significant. No nausea or emesis.   Objective: Vitals:   10/15/17 2251 10/16/17 0123 10/16/17 0613 10/16/17 0836  BP:   111/78   Pulse:   (!) 53   Resp:   20   Temp:   97.7 F (36.5 C)   TempSrc:   Oral   SpO2: 96% 95% 92% 99%  Weight:      Height:        Intake/Output Summary (Last 24 hours) at 10/16/2017 1211 Last data filed at 10/16/2017 0830 Gross per 24 hour  Intake 480 ml  Output 1200 ml  Net -720 ml   Filed Weights   10/14/17 0122 10/15/17 0946  Weight: 103.7 kg (228 lb 9.6 oz) 109.1 kg (240 lb 8 oz)    Examination:  General exam: NAD Respiratory system: Fair air movement. Minimal wheezing. Minimal bibasilar crackles.  Cardiovascular system: RRR. No murmurs, rubs, gallops or clicks. 1+ bilateral hard lower extremity edema.  Gastrointestinal system: Abdomen is soft, nontender, nondistended, no organomegaly, positive  bowel sounds.  Central nervous system: Alert and oriented. No focal neurological deficits. Extremities: Symmetric 5 x 5 power. Skin: Some venous stasis changes noted on bilateral shins. Psychiatry: Judgement and insight appear fair. Mood & affect appropriate.     Data Reviewed: I have personally reviewed following labs and imaging studies  CBC: Recent Labs  Lab 10/13/17 2110 10/14/17 0905 10/15/17 0656 10/16/17 0643  WBC 1.7* 1.5* 2.2* 1.6*  NEUTROABS 0.6* 0.8* 1.0* 0.8*  HGB 9.9* 9.4* 8.9* 8.7*  HCT 30.8* 29.9* 27.9* 27.2*  MCV 93.1 94.6 93.9 92.5  PLT 466* 439* 469* 562*   Basic Metabolic Panel: Recent Labs  Lab 10/10/17 1514 10/13/17 2110 10/14/17 0905 10/15/17 0656 10/16/17 0643  NA 141 139 133* 132* 128*  K 4.2 3.6 4.6 4.5 4.6  CL 97 102 103 98* 96*  CO2 26 24 18* 22 21*  GLUCOSE 99 97 295* 208* 164*  BUN 22 31* 38* 63* 79*  CREATININE 1.47* 1.80* 2.00* 2.95* 3.10*  CALCIUM 9.7 9.2 8.4* 8.7* 8.6*  MG  --   --  1.5* 2.7*  --    GFR: Estimated Creatinine Clearance: 22.6 mL/min (A) (by C-G formula based on SCr of 3.1 mg/dL (H)). Liver Function Tests: Recent Labs  Lab 10/13/17 2110  AST 24  ALT 18  ALKPHOS 114  BILITOT 0.9  PROT 7.3  ALBUMIN 3.4*   No results for  input(s): LIPASE, AMYLASE in the last 168 hours. No results for input(s): AMMONIA in the last 168 hours. Coagulation Profile: Recent Labs  Lab 10/13/17 2110  INR 1.07   Cardiac Enzymes: Recent Labs  Lab 10/14/17 0158 10/14/17 0629 10/14/17 1314  TROPONINI 0.13* 0.09* 0.08*   BNP (last 3 results) Recent Labs    08/17/17 1831  PROBNP 541.0*   HbA1C: Recent Labs    10/14/17 1757  HGBA1C 6.3*   CBG: Recent Labs  Lab 10/15/17 1115 10/15/17 1709 10/15/17 2202 10/16/17 0756 10/16/17 1108  GLUCAP 295* 266* 232* 210* 228*   Lipid Profile: No results for input(s): CHOL, HDL, LDLCALC, TRIG, CHOLHDL, LDLDIRECT in the last 72 hours. Thyroid Function Tests: No results for  input(s): TSH, T4TOTAL, FREET4, T3FREE, THYROIDAB in the last 72 hours. Anemia Panel: No results for input(s): VITAMINB12, FOLATE, FERRITIN, TIBC, IRON, RETICCTPCT in the last 72 hours. Sepsis Labs: Recent Labs  Lab 10/13/17 2119 10/13/17 2353  LATICACIDVEN 1.27 1.71    Recent Results (from the past 240 hour(s))  Culture, blood (Routine x 2)     Status: None (Preliminary result)   Collection Time: 10/13/17  7:10 PM  Result Value Ref Range Status   Specimen Description BLOOD RIGHT ANTECUBITAL  Final   Special Requests   Final    BOTTLES DRAWN AEROBIC AND ANAEROBIC Blood Culture adequate volume   Culture   Final    NO GROWTH 1 DAY Performed at [facility], [place] [address], [place], [place] [address]    Report Status PENDING  Incomplete  Culture, blood (Routine x 2)     Status: None (Preliminary result)   Collection Time: 10/13/17  9:20 PM  Result Value Ref Range Status   Specimen Description BLOOD LEFT HAND  Final   Special Requests   Final    BOTTLES DRAWN AEROBIC ONLY Blood Culture adequate volume   Culture   Final    NO GROWTH 1 DAY Performed at [facility], [place] [address], [place], [place] [address]    Report Status PENDING  Incomplete  Culture, blood (routine x 2) Call MD if unable to obtain prior to antibiotics being given     Status: None (Preliminary result)   Collection Time: 10/14/17  1:58 AM  Result Value Ref Range Status   Specimen Description BLOOD LEFT HAND  Final   Special Requests IN PEDIATRIC BOTTLE Blood Culture adequate volume  Final   Culture   Final    NO GROWTH 1 DAY Performed at [facility], [place] [address] [place], [place] [address]    Report Status PENDING  Incomplete  Culture, blood (routine x 2) Call MD if unable to obtain prior to antibiotics being given     Status: None (Preliminary result)   Collection Time: 10/14/17  1:58 AM  Result Value Ref Range Status   Specimen Description BLOOD RIGHT HAND  Final   Special Requests    Final    BOTTLES DRAWN AEROBIC AND ANAEROBIC Blood Culture adequate volume   Culture   Final    NO GROWTH 1 DAY Performed at [facility], [place] [address], [place], [place] [address]    Report Status PENDING  Incomplete  MRSA PCR Screening     Status: None   Collection Time: 10/15/17 10:10 AM  Result Value Ref Range Status   MRSA by PCR NEGATIVE NEGATIVE Final    Comment:        The GeneXpert MRSA  Assay (FDA approved for NASAL specimens only), is one component of a comprehensive MRSA colonization surveillance program. It is not intended to diagnose MRSA infection nor to guide or monitor treatment for MRSA infections.          Radiology Studies: Dg Chest 2 View  Result Date: 10/15/2017 CLINICAL DATA:  71 year old female with a history of shortness of breath and altered mental status EXAM: CHEST  2 VIEW COMPARISON:  10/13/2017, 08/19/2017 FINDINGS: Cardiomediastinal silhouette unchanged in size and contour. No evidence of central vascular congestion. Improving aeration of the right chest, with residual mixed interstitial and airspace opacity. No large pleural effusion. Shunt tubing projects over the right chest wall IMPRESSION: Improving right-sided airspace disease. Electronically Signed   By: Corrie Mckusick D.O.   On: 10/15/2017 15:13   Nm Pulmonary Perf And Vent  Result Date: 10/14/2017 CLINICAL DATA:  Shortness of breath. EXAM: NUCLEAR MEDICINE VENTILATION - PERFUSION LUNG SCAN TECHNIQUE: Ventilation images were obtained in multiple projections using inhaled aerosol Tc-42m DTPA. Perfusion images were obtained in multiple projections after intravenous injection of Tc-54m MAA. RADIOPHARMACEUTICALS:  30.0 mCi Technetium-82m DTPA aerosol inhalation and 4.8 mCi Technetium-50m MAA IV COMPARISON:  Chest radiograph, 10/13/2017 FINDINGS: Ventilation: There are ventilatory defects that are more evident on the left, involving significant portion of the left lower lobe. There is a  segmental appearing ventilatory defect involving the right upper lobe. Perfusion: There are perfusion defects which are matched to the ventilatory abnormalities. On the left, there is relative decreased profusion in the lower lobe, that is not clearly segmental. On the right, there is a segmental appearing defect along the posterior upper lobe, which corresponds to an area of consolidation on the previous day's chest radiograph. IMPRESSION: 1. Intermediate probability ventilation-perfusion study for pulmonary thromboembolism. Based on modified PIOPED criteria, with a moderate to large matching segmental defect noted in the right upper lobe with a matching chest radiographic abnormality (triple match). Ventilatory and perfusion defects on the left are not clearly segmental, and ventilatory defects are larger. Electronically Signed   By: Lajean Manes M.D.   On: 10/14/2017 16:04        Scheduled Meds: . amLODipine  10 mg Oral Daily  . aspirin EC  81 mg Oral Daily  . atorvastatin  40 mg Oral q1800  . budesonide (PULMICORT) nebulizer solution  0.5 mg Nebulization BID  . carvedilol  3.125 mg Oral BID  . enoxaparin (LOVENOX) injection  30 mg Subcutaneous Q24H  . furosemide  40 mg Intravenous Q12H  . hydrALAZINE  25 mg Oral Q8H  . insulin aspart  0-20 Units Subcutaneous TID WC  . insulin aspart  0-5 Units Subcutaneous QHS  . insulin aspart  5 Units Subcutaneous TID WC  . insulin glargine  30 Units Subcutaneous QHS  . ipratropium-albuterol  3 mL Nebulization Q6H  . isosorbide mononitrate  30 mg Oral Daily  . magnesium oxide  400 mg Oral BID  . methylPREDNISolone (SOLU-MEDROL) injection  60 mg Intravenous Q12H  . vitamin B-12  1,000 mcg Oral Daily   Continuous Infusions: . levofloxacin (LEVAQUIN) IV Stopped (10/16/17 0636)     LOS: 3 days    Time spent: 60 minutes    Vanice Rappa, MD [facility] Pager [phone]  If 7PM-7AM, please contact  night-coverage [email or web address] Password [email or web address] 10/16/2017, 12:11 PM

## 2017-10-18 NOTE — Care Management Important Message (Signed)
Important Message  Patient Details  Name: Candace Long MRN: [id number] Date of Birth: January 04, 1946   Medicare Important Message Given:  Yes    Kerin Salen 10/18/2017, 10:46 AMImportant Message  Patient Details  Name: Candace Long MRN: [id number] Date of Birth: 1946-06-08   Medicare Important Message Given:  Yes    Kerin Salen 10/18/2017, 10:46 AM

## 2017-10-18 NOTE — Progress Notes (Addendum)
Patient ID: Candace Long, female   DOB: 1946-07-01, 71 y.o.   MRN: [id number] [facility] Progress Note   Assessment/ Plan:   1. Acute on CRF (baseline chronic kidney disease stage IIIB-like a diabetic/hypertensive nephropathy)- baseline creatinine appears to be around 1.5 (EGFR 42 mL/minute). Data pointing to hemodynamically mediated acute kidney injury that is improving somewhat with ongoing diuretic therapy. I suspect that her elevated BUN is not only from the aggressive diuretic therapy but from the effect of corticosteroids. I will decrease furosemide to 40 mg IV twice a day starting tonight. 2. Possible community-acquired pneumonia- on levaquin and remains afebrile. Suspect that both CHF exacerbation and lower respiratory tract infection may have been contributing to presentation symptoms. Without significant bronchospasm, would recommend rapidly decreasing/weaning off of corticosteroids. 3. Hyponatremia: Secondary to free water excretion defect of acute kidney injury as well as the impact of CHF exacerbation/ADH activation. Sodium level improving with ongoing diuretic therapy/volume unloading. Also assisted by improving renal function. 4. HTN - BP's have been on the low normal side, will decrease BP meds a little.  5. DM on insulin 6. Anemia: Possibly anemia of chronic kidney disease versus acute illness/dilutional with volume overload. Check iron studies. 7. Hx cerebral aneurysm repair   Subjective:   Reports to be feeling better today-denies any chest pain overnight and states that shortness of breath is better.    Objective:   BP (!) 142/98 (BP Location: Left Arm)   Pulse 65   Temp 98.1 F (36.7 C) (Oral)   Resp 18   Ht 5' 9"  (1.753 m)   Wt 115.8 kg (255 lb 6.4 oz)   SpO2 99%   BMI 37.72 kg/m   Intake/Output Summary (Last 24 hours) at 10/18/2017 1340 Last data filed at 10/18/2017 0930 Gross per 24 hour  Intake 580 ml  Output 2800 ml  Net -2220 ml   Weight  change:   Physical Exam: Gen: Comfortably sitting up in recliner-lunch tray empty CVS: Pulse regular rhythm, normal rate, S1 and S2 normal Resp: Distant breath sounds bilaterally-no rhonchi. Fine rales right base Abd: Soft, obese, nontender Ext: 1+ pitting bilateral lower extremity edema superimposed on what appears to be lymphedema  Imaging: No results found.  Labs: BMET Recent Labs  Lab 10/13/17 2110 10/14/17 0905 10/15/17 0656 10/16/17 0643 10/17/17 0555 10/18/17 0636  NA 139 133* 132* 128* 130* 134*  K 3.6 4.6 4.5 4.6 4.5 4.3  CL 102 103 98* 96* 99* 99*  CO2 24 18* 22 21* 20* 23  GLUCOSE 97 295* 208* 164* 181* 174*  BUN 31* 38* 63* 79* 98* 111*  CREATININE 1.80* 2.00* 2.95* 3.10* 3.02* 2.84*  CALCIUM 9.2 8.4* 8.7* 8.6* 8.7* 8.9  PHOS  --   --   --   --  4.8*  --    CBC Recent Labs  Lab 10/15/17 0656 10/16/17 0643 10/17/17 0555 10/18/17 0636  WBC 2.2* 1.6* 1.6* 1.6*  NEUTROABS 1.0* 0.8* 1.0* 1.1*  HGB 8.9* 8.7* 8.8* 9.2*  HCT 27.9* 27.2* 27.5* 27.8*  MCV 93.9 92.5 90.5 90.3  PLT 469* 437* 441* 495*    Medications:    . aspirin EC  81 mg Oral Daily  . atorvastatin  40 mg Oral q1800  . budesonide (PULMICORT) nebulizer solution  0.5 mg Nebulization BID  . carvedilol  3.125 mg Oral BID  . enoxaparin (LOVENOX) injection  30 mg Subcutaneous Q24H  . furosemide  40 mg Intravenous BID  . hydrALAZINE  10 mg  Oral Q8H  . insulin aspart  0-20 Units Subcutaneous TID WC  . insulin aspart  0-5 Units Subcutaneous QHS  . insulin aspart  10 Units Subcutaneous TID WC  . insulin glargine  34 Units Subcutaneous QHS  . ipratropium-albuterol  3 mL Nebulization TID  . isosorbide mononitrate  30 mg Oral Daily  . levofloxacin  750 mg Oral Q48H  . magnesium oxide  400 mg Oral BID  . methylPREDNISolone (SOLU-MEDROL) injection  60 mg Intravenous Q12H  . vitamin B-12  1,000 mcg Oral Daily   Elmarie Shiley, MD 10/18/2017, 1:40 PM

## 2017-10-18 NOTE — Evaluation (Signed)
Physical Therapy Evaluation Patient Details Name: Candace Long MRN: [id number] DOB: 11/27/1946 Today's Date: 10/18/2017   History of Present Illness  71 yo female admitted with Pna, tachycardia, ARF. Hx of DM, CKD, HTN, cerebral aneurysm repair, obesity  Clinical Impression  On eval, pt was supervision-min guard assist for mobility. She walked ~125 feet with a RW. Pt presents with general weakness. She reports that she has memory issues at baseline. Pt stated that she lived with her son. Will follow and progress activity as tolerated. If pt progress well, she may not need HHPT at discharge.     Follow Up Recommendations Supervision - Intermittent;Home health PT(depending on progress. may not need hhpt at discharge if she progresses well. )    Equipment Recommendations  None recommended by PT    Recommendations for Other Services       Precautions / Restrictions Precautions Precautions: Fall Restrictions Weight Bearing Restrictions: No      Mobility  Bed Mobility               General bed mobility comments: oob in recliner  Transfers Overall transfer level: Needs assistance Equipment used: Rolling walker (2 wheeled) Transfers: Sit to/from Stand Sit to Stand: Supervision         General transfer comment: for safety.  Ambulation/Gait Ambulation/Gait assistance: Supervision Ambulation Distance (Feet): 125 Feet Assistive device: Rolling walker (2 wheeled) Gait Pattern/deviations: Step-through pattern;Decreased stride length     General Gait Details: used RW for safety. close supervision for safety. no lob.   Stairs            Wheelchair Mobility    Modified Rankin (Stroke Patients Only)       Balance                                             Pertinent Vitals/Pain Pain Assessment: No/denies pain    Home Living Family/patient expects to be discharged to:: Private residence Living Arrangements: Children Available Help at  Discharge: Family;Available PRN/intermittently Type of Home: House Home Access: Stairs to enter Entrance Stairs-Rails: None   Home Layout: One level        Prior Function Level of Independence: Needs assistance   Gait / Transfers Assistance Needed: Son sometimes helps with stairs. Otherwise independent.            Hand Dominance        Extremity/Trunk Assessment   Upper Extremity Assessment Upper Extremity Assessment: Generalized weakness    Lower Extremity Assessment Lower Extremity Assessment: Generalized weakness    Cervical / Trunk Assessment Cervical / Trunk Assessment: Normal  Communication   Communication: No difficulties  Cognition Arousal/Alertness: Awake/alert Behavior During Therapy: WFL for tasks assessed/performed Overall Cognitive Status: History of cognitive impairments - at baseline                                 General Comments: per pt, she has memory issues      General Comments      Exercises     Assessment/Plan    PT Assessment Patient needs continued PT services  PT Problem List Decreased strength;Decreased mobility;Decreased knowledge of use of DME       PT Treatment Interventions DME instruction;Gait training;Functional mobility training;Therapeutic activities;Therapeutic exercise;Balance training    PT Goals (Current goals can be  found in the Care Plan section)  Acute Rehab PT Goals Patient Stated Goal: none stated PT Goal Formulation: With patient Time For Goal Achievement: 11/01/17 Potential to Achieve Goals: Good    Frequency Min 3X/week   Barriers to discharge        Co-evaluation               AM-PAC PT "6 Clicks" Daily Activity  Outcome Measure Difficulty turning over in bed (including adjusting bedclothes, sheets and blankets)?: A Little Difficulty moving from lying on back to sitting on the side of the bed? : A Little Difficulty sitting down on and standing up from a chair with arms  (e.g., wheelchair, bedside commode, etc,.)?: A Little Help needed moving to and from a bed to chair (including a wheelchair)?: A Little Help needed walking in hospital room?: A Little Help needed climbing 3-5 steps with a railing? : A Little 6 Click Score: 18    End of Session Equipment Utilized During Treatment: Gait belt Activity Tolerance: Patient tolerated treatment well Patient left: in chair;with chair alarm set;with call bell/phone within reach   PT Visit Diagnosis: Muscle weakness (generalized) (M62.81);Difficulty in walking, not elsewhere classified (R26.2)    Time: 8588-5027 PT Time Calculation (min) (ACUTE ONLY): 22 min   Charges:   PT Evaluation $PT Eval Moderate Complexity: 1 Mod     PT G Codes:          Weston Anna, MPT Pager: [phone]

## 2017-10-19 LAB — RENAL FUNCTION PANEL
ALBUMIN: 3.1 g/dL — AB (ref 3.5–5.0)
Anion gap: 12 (ref 5–15)
BUN: 109 mg/dL — AB (ref 6–20)
CO2: 25 mmol/L (ref 22–32)
CREATININE: 2.58 mg/dL — AB (ref 0.44–1.00)
Calcium: 9.1 mg/dL (ref 8.9–10.3)
Chloride: 102 mmol/L (ref 101–111)
GFR calc Af Amer: 21 mL/min — ABNORMAL LOW (ref >60)
GFR, EST NON AFRICAN AMERICAN: 18 mL/min — AB (ref >60)
Glucose, Bld: 145 mg/dL — ABNORMAL HIGH (ref 65–99)
PHOSPHORUS: 4.8 mg/dL — AB (ref 2.5–4.6)
Potassium: 4.1 mmol/L (ref 3.5–5.1)
SODIUM: 139 mmol/L (ref 135–145)

## 2017-10-19 LAB — CULTURE, BLOOD (ROUTINE X 2)
CULTURE: NO GROWTH
CULTURE: NO GROWTH
SPECIAL REQUESTS: ADEQUATE
Special Requests: ADEQUATE

## 2017-10-19 LAB — MAGNESIUM: MAGNESIUM: 2.8 mg/dL — AB (ref 1.7–2.4)

## 2017-10-19 LAB — GLUCOSE, CAPILLARY
GLUCOSE-CAPILLARY: 156 mg/dL — AB (ref 65–99)
Glucose-Capillary: 126 mg/dL — ABNORMAL HIGH (ref 65–99)
Glucose-Capillary: 141 mg/dL — ABNORMAL HIGH (ref 65–99)
Glucose-Capillary: 173 mg/dL — ABNORMAL HIGH (ref 65–99)

## 2017-10-19 MED ORDER — IPRATROPIUM-ALBUTEROL 0.5-2.5 (3) MG/3ML IN SOLN
3.0000 mL | Freq: Two times a day (BID) | RESPIRATORY_TRACT | Status: DC
  Administered 2017-10-20 – 2017-10-21 (×3): 3 mL via RESPIRATORY_TRACT
  Filled 2017-10-19 (×3): qty 3

## 2017-10-19 MED ORDER — PREDNISONE 20 MG PO TABS
40.0000 mg | ORAL_TABLET | Freq: Every day | ORAL | Status: AC
  Administered 2017-10-20 – 2017-10-21 (×2): 40 mg via ORAL
  Filled 2017-10-19 (×2): qty 2

## 2017-10-19 NOTE — Progress Notes (Signed)
Physical Therapy Treatment Patient Details Name: Candace Long MRN: [id number] DOB: 07/30/1946 Today's Date: 10/19/2017    History of Present Illness 71 yo female admitted with Pna, tachycardia, ARF. Hx of DM, CKD, HTN, cerebral aneurysm repair, obesity    PT Comments    Pt continues to participate well with therapy.    Follow Up Recommendations  Supervision - Intermittent     Equipment Recommendations  None recommended by PT    Recommendations for Other Services       Precautions / Restrictions Precautions Precautions: Fall Restrictions Weight Bearing Restrictions: No    Mobility  Bed Mobility Overal bed mobility: Needs Assistance Bed Mobility: Sit to Supine       Sit to supine: Min assist   General bed mobility comments: small amount of assist for LEs onto bed.   Transfers Overall transfer level: Needs assistance Equipment used: Rolling walker (2 wheeled) Transfers: Sit to/from Stand Sit to Stand: Supervision         General transfer comment: for safety.  Ambulation/Gait Ambulation/Gait assistance: Supervision Ambulation Distance (Feet): 150 Feet Assistive device: Rolling walker (2 wheeled) Gait Pattern/deviations: Step-through pattern;Decreased stride length     General Gait Details: used RW for safety. close supervision for safety. no lob.    Stairs            Wheelchair Mobility    Modified Rankin (Stroke Patients Only)       Balance Overall balance assessment: Needs assistance Sitting-balance support: No upper extremity supported;Feet supported Sitting balance-Leahy Scale: Good     Standing balance support: Bilateral upper extremity supported;During functional activity Standing balance-Leahy Scale: Fair                              Cognition Arousal/Alertness: Awake/alert Behavior During Therapy: WFL for tasks assessed/performed Overall Cognitive Status: History of cognitive impairments - at baseline                                  General Comments: per pt, she has memory issues      Exercises      General Comments        Pertinent Vitals/Pain Pain Assessment: No/denies pain    Home Living Family/patient expects to be discharged to:: Private residence Living Arrangements: Children Available Help at Discharge: Family;Available PRN/intermittently Type of Home: House Home Access: Stairs to enter Entrance Stairs-Rails: None Home Layout: One level Home Equipment: None      Prior Function Level of Independence: Needs assistance  Gait / Transfers Assistance Needed: Son sometimes helps with stairs. Otherwise independent.  ADL's / Homemaking Assistance Needed: reports that she was independent with selfcare at home     PT Goals (current goals can now be found in the care plan section) Acute Rehab PT Goals Patient Stated Goal: go home Progress towards PT goals: Progressing toward goals    Frequency    Min 3X/week      PT Plan Current plan remains appropriate    Co-evaluation              AM-PAC PT "6 Clicks" Daily Activity  Outcome Measure  Difficulty turning over in bed (including adjusting bedclothes, sheets and blankets)?: A Little Difficulty moving from lying on back to sitting on the side of the bed? : A Little Difficulty sitting down on and standing up from a chair with arms (e.g.,  wheelchair, bedside commode, etc,.)?: A Little Help needed moving to and from a bed to chair (including a wheelchair)?: A Little Help needed walking in hospital room?: A Little Help needed climbing 3-5 steps with a railing? : A Little 6 Click Score: 18    End of Session Equipment Utilized During Treatment: Gait belt Activity Tolerance: Patient tolerated treatment well Patient left: in bed;with call bell/phone within reach;with bed alarm set   PT Visit Diagnosis: Muscle weakness (generalized) (M62.81);Difficulty in walking, not elsewhere classified (R26.2)      Time: 2426-8341 PT Time Calculation (min) (ACUTE ONLY): 13 min  Charges:  $Gait Training: 8-22 mins                    G Codes:          Weston Anna, MPT Pager: [phone]

## 2017-10-19 NOTE — Progress Notes (Signed)
Patient ID: Candace Long, female   DOB: 28-Dec-1945, 71 y.o.   MRN: [id number] [facility] Progress Note   Assessment/ Plan:   1. Acute on CRF (baseline chronic kidney disease stage IIIB-like a diabetic/hypertensive nephropathy)- baseline creatinine ~1.5 (EGFR 42 mL/minute). Suspected to have hemodynamically mediated acute kidney injury in the face of pneumonia/CHF exacerbation with renal hypoperfusion. Renal function improving with ongoing diuretic therapy-excellent response to furosemide noted overnight. Dosage was decreased on account of fear of her rising BUN which is in part from corticosteroids. 2. Possible community-acquired pneumonia- on levaquin and remains afebrile. Suspect that both CHF exacerbation and lower respiratory tract infection may have been contributing to presentation symptoms. Clinically doing better-wean steroids off as quickly as practicable. 3. Hyponatremia: Secondary to free water excretion defect of acute kidney injury as well as the impact of CHF exacerbation/ADH activation. Significant improvement of sodium level noted with ongoing diuretic therapy/partial renal recovery. 4. HTN - blood pressures appear to be acceptable, monitor with ongoing diuretic therapy  5. DM on insulin 6. Anemia: Possibly anemia of chronic kidney disease versus acute illness/dilutional with volume overload. Borderline iron saturations, continue to monitor. 7. Hx cerebral aneurysm repair   Subjective:   Reports to be feeling better today-denies any chest pain overnight and states that shortness of breath is better.    Objective:   BP (!) 145/75   Pulse 78   Temp (!) 97.4 F (36.3 C) (Axillary)   Resp 18   Ht 5' 9"  (1.753 m)   Wt 113.8 kg (250 lb 14.1 oz)   SpO2 97%   BMI 37.05 kg/m   Intake/Output Summary (Last 24 hours) at 10/19/2017 1321 Last data filed at 10/19/2017 0800 Gross per 24 hour  Intake 240 ml  Output 5450 ml  Net -5210 ml   Weight change:   Physical  Exam: Gen: Comfortably sitting up in recliner eating her lunch CVS: Pulse regular rhythm, normal rate, S1 and S2 normal Resp: Diminished breath sounds over bases, no distinct rales or rhonchi audible Abd: Soft, obese, nontender Ext: Trace-1+ pitting bilateral lower extremity edema with chronic venous stasis dermopathy  Imaging: No results found.  Labs: BMET Recent Labs  Lab 10/13/17 2110 10/14/17 0905 10/15/17 0656 10/16/17 0643 10/17/17 0555 10/18/17 0636 10/19/17 0601  NA 139 133* 132* 128* 130* 134* 139  K 3.6 4.6 4.5 4.6 4.5 4.3 4.1  CL 102 103 98* 96* 99* 99* 102  CO2 24 18* 22 21* 20* 23 25  GLUCOSE 97 295* 208* 164* 181* 174* 145*  BUN 31* 38* 63* 79* 98* 111* 109*  CREATININE 1.80* 2.00* 2.95* 3.10* 3.02* 2.84* 2.58*  CALCIUM 9.2 8.4* 8.7* 8.6* 8.7* 8.9 9.1  PHOS  --   --   --   --  4.8*  --  4.8*   CBC Recent Labs  Lab 10/15/17 0656 10/16/17 0643 10/17/17 0555 10/18/17 0636  WBC 2.2* 1.6* 1.6* 1.6*  NEUTROABS 1.0* 0.8* 1.0* 1.1*  HGB 8.9* 8.7* 8.8* 9.2*  HCT 27.9* 27.2* 27.5* 27.8*  MCV 93.9 92.5 90.5 90.3  PLT 469* 437* 441* 495*    Medications:    . aspirin EC  81 mg Oral Daily  . atorvastatin  40 mg Oral q1800  . budesonide (PULMICORT) nebulizer solution  0.5 mg Nebulization BID  . carvedilol  3.125 mg Oral BID  . enoxaparin (LOVENOX) injection  30 mg Subcutaneous Q24H  . furosemide  40 mg Intravenous BID  . hydrALAZINE  10 mg Oral  Q8H  . insulin aspart  0-20 Units Subcutaneous TID WC  . insulin aspart  0-5 Units Subcutaneous QHS  . insulin aspart  10 Units Subcutaneous TID WC  . insulin glargine  34 Units Subcutaneous QHS  . ipratropium-albuterol  3 mL Nebulization TID  . isosorbide mononitrate  30 mg Oral Daily  . levofloxacin  750 mg Oral Q48H  . magnesium oxide  400 mg Oral BID  . predniSONE  60 mg Oral QAC breakfast  . vitamin B-12  1,000 mcg Oral Daily   Elmarie Shiley, MD 10/19/2017, 1:21 PM

## 2017-10-19 NOTE — Progress Notes (Signed)
PROGRESS NOTE    Candace Long  YPP:[id number] DOB: 12/07/1946 DOA: 10/13/2017 PCP: Ria Bush, MD (Confirm with patient/family/NH records and if not entered, this HAS to be entered at [facility] point of entry. "No PCP" if truly none.)   Brief Narrative: (Start on day 1 of progress note - keep it brief and live) Candace Long  is a 71 y.o. female, w  Dm2, hypertension, hyperlipidemia, ckd stage 3, cerebral aneurysm repair,  C/o feeling poorly. + wheezing, slight confusion.  Slight cough , sob.    Denies fever, chills, cp, palp , n/v, diarrhea, brbpr.  Pt does note some slight cp under her neck with cough.    She presented with SOB and concern for HCAP.     Assessment & Plan:   Principal Problem:   Healthcare-associated pneumonia Active Problems:   Hypertension   Controlled type 2 diabetes mellitus with diabetic nephropathy (HCC)   HLD (hyperlipidemia)   Severe obesity (BMI 35.0-39.9) with comorbidity (HCC)   Chronic kidney disease, stage III (moderate) (HCC)   Vitamin D deficiency   Vitamin B12 deficiency   Elevated troponin   Acute systolic CHF (congestive heart failure) (HCC)   Tachycardia   Acute kidney injury superimposed on CKD ([place]) Stage 3   HCAP (healthcare-associated pneumonia)   1 healthcare associated pneumonia Patient presented with complaints of feeling poorly, some wheezing, cough, shortness of breath. Chest x-ray done concerning for probable pneumonia.  D-dimer was obtained which was elevated, VQ scan which was done showed intermediate probability, but in setting of improved resp status and findings c/f pneumonia think this is less likely.   Urine Legionella antigen negative.  Urine pneumococcus antigen is negative.  MRSA PCR is negative.  Continue nebulizer treatments, IV vancomycin and cefepime have been discontinued.  Patient has been transitioned to oral Levaquin to complete course of treatment. Taper steroids.  #2 acute renal failure on chronic kidney  disease stage III--- baseline creatinine 1.5.  Now 2.58, improving from 3.10.  Renal following.  Suspected due to prerenal azotemia, secondary to acute systolic CHF exacerbation. Renal ultrasound negative for hydronephrosis however does have some echogenicity worrisome for chronic medical renal disease.  Repeat chest x-ray with some improvement.  Nephrology following.   Currently receiving IV lasix 40 mg IV BID (decreased from 80 BID). Patient seen in consultation by nephrology was in agreement with diuresis. Lasix dose was increased to 80 mg IV every 12 hours.  I/O Daily weights Will need f/u with nephrology as outpatient.  #3 probable acute on chronic systolic heart failure Patient had presented with worsening shortness of breath. Patient noted on physical exam to have lower extremity edema, positive JVD.  Current weight is 115.8 kg from 114.7 kg from 103.7 kg on admission.  Elevated troponin on admission, suspected due to demand.  2-D echo from 08/20/2017 with a EF of 40%, diffuse hypokinesis. Patient's metformin has been discontinued on admission.  Diuresis per nephrology I/O, daily weights  #4 tachycardia Questionable etiology. Resolved. D-dimer was elevated and as such VQ scan was obtained which showed intermediate probability ventilation/perfusion study for PE. Low suspicion for PE, more likely symptoms secondary to pneumonia and probable CHF exacerbation. Tachycardia improved. Continue Coreg.  #5 hypertension Stable. Doses of antihypertensive medications has been adjusted per nephrology for better renal perfusion. Continue current regimen of Coreg, hydralazine, Norvasc, imdur.  #6 hyperlipidemia Fasting lipid panel from 08/20/2017 with a LDL of 67, cholesterol of 118. Continue statin.  #7 cardiomyopathy Currently stable. Continue cardiac regimen of  Coreg, hydralazine, Imdur, Norvasc, statin. On diuretics.  #8 diabetes mellitus type 2 Hemoglobin A1c 6.3. CBGs have been  ranging from 182-218. Increased Lantus to 34 units daily. Increased meal coverage insulin to 10 units 3 times a day. Continue sliding scale insulin.  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  A&Ox1 which is baseline per son.  CTM.   DVT prophylaxis: lovenox Code Status: full  Family Communication: discussed with son, Juanda Crumble Disposition Plan: pending improvement  Consultants:   nephrology  Procedures: (Don't include imaging studies which can be auto populated. Include things that cannot be auto populated i.e. Echo, Carotid and venous dopplers, Foley, Bipap, HD, tubes/drains, wound vac, central lines etc)  V/Q scan  Antimicrobials: (specify start and planned stop date. Auto populated tables are space occupying and do not give end dates) Anti-infectives (From admission, onward)   Start     Dose/Rate Route Frequency Ordered Stop   10/18/17 1000  levofloxacin (LEVAQUIN) tablet 750 mg     750 mg Oral Every 48 hours 10/17/17 1725     10/14/17 2200  vancomycin (VANCOCIN) IVPB 750 mg/150 ml premix  Status:  Discontinued     750 mg 150 mL/hr over 60 Minutes Intravenous Every 24 hours 10/14/17 0133 10/15/17 1000   10/14/17 0600  levofloxacin (LEVAQUIN) IVPB 750 mg  Status:  Discontinued     750 mg 100 mL/hr over 90 Minutes Intravenous Every 48 hours 10/14/17 0045 10/17/17 1725   10/14/17 0115  vancomycin (VANCOCIN) IVPB 1000 mg/200 mL premix     1,000 mg 200 mL/hr over 60 Minutes Intravenous NOW 10/14/17 0109 10/14/17 0222   10/13/17 2115  ceFEPIme (MAXIPIME) 2 g in dextrose 5 % 50 mL IVPB     2 g 100 mL/hr over 30 Minutes Intravenous  Once 10/13/17 2106 10/13/17 2157   10/13/17 2115  vancomycin (VANCOCIN) IVPB 1000 mg/200 mL premix     1,000 mg 200 mL/hr over 60 Minutes Intravenous  Once 10/13/17 2106 10/13/17 2330     Subjective: A&Ox1 which is baseline per my discussion with son.  No SOB.  Feeling better.   Objective: Vitals:   10/19/17 0514  10/19/17 0918 10/19/17 1420 10/19/17 1438  BP: (!) 133/99 (!) 145/75  (!) 155/92  Pulse: 63 78  66  Resp: 18   19  Temp: (!) 97.4 F (36.3 C)   (!) 97.4 F (36.3 C)  TempSrc: Axillary     SpO2: 97%  90% 100%  Weight: 113.8 kg (250 lb 14.1 oz)     Height:        Intake/Output Summary (Last 24 hours) at 10/19/2017 1451 Last data filed at 10/19/2017 1231 Gross per 24 hour  Intake 240 ml  Output 6100 ml  Net -5860 ml   Filed Weights   10/16/17 1334 10/17/17 0520 10/19/17 0514  Weight: 114.7 kg (252 lb 12.8 oz) 115.8 kg (255 lb 6.4 oz) 113.8 kg (250 lb 14.1 oz)    Examination:  General exam: Appears calm and comfortable  Respiratory system: Clear to auscultation. Respiratory effort normal. Cardiovascular system: S1 & S2 heard, RRR. No JVD, murmurs, rubs, gallops or clicks. No pedal edema. Gastrointestinal system: Abdomen is nondistended, soft and nontender. No organomegaly or masses felt. Normal bowel sounds heard. Central nervous system: Alert and oriented x1 (thought [facility] and [facility] hospital). No focal neurological deficits. Extremities: Symmetric 5 x 5 power.  Trace LEE.  Skin: No rashes, lesions or ulcers Psychiatry: Judgement  and insight appear normal. Mood & affect appropriate.     Data Reviewed: I have personally reviewed following labs and imaging studies  CBC: Recent Labs  Lab 10/14/17 0905 10/15/17 0656 10/16/17 0643 10/17/17 0555 10/18/17 0636  WBC 1.5* 2.2* 1.6* 1.6* 1.6*  NEUTROABS 0.8* 1.0* 0.8* 1.0* 1.1*  HGB 9.4* 8.9* 8.7* 8.8* 9.2*  HCT 29.9* 27.9* 27.2* 27.5* 27.8*  MCV 94.6 93.9 92.5 90.5 90.3  PLT 439* 469* 437* 441* 347*   Basic Metabolic Panel: Recent Labs  Lab 10/14/17 0905 10/15/17 0656 10/16/17 0643 10/17/17 0555 10/18/17 0636 10/19/17 0601  NA 133* 132* 128* 130* 134* 139  K 4.6 4.5 4.6 4.5 4.3 4.1  CL 103 98* 96* 99* 99* 102  CO2 18* 22 21* 20* 23 25  GLUCOSE 295* 208* 164* 181* 174* 145*  BUN 38* 63* 79* 98* 111* 109*    CREATININE 2.00* 2.95* 3.10* 3.02* 2.84* 2.58*  CALCIUM 8.4* 8.7* 8.6* 8.7* 8.9 9.1  MG 1.5* 2.7*  --   --   --  2.8*  PHOS  --   --   --  4.8*  --  4.8*   GFR: Estimated Creatinine Clearance: 27.7 mL/min (A) (by C-G formula based on SCr of 2.58 mg/dL (H)). Liver Function Tests: Recent Labs  Lab 10/13/17 2110 10/17/17 0555 10/19/17 0601  AST 24  --   --   ALT 18  --   --   ALKPHOS 114  --   --   BILITOT 0.9  --   --   PROT 7.3  --   --   ALBUMIN 3.4* 3.2* 3.1*   No results for input(s): LIPASE, AMYLASE in the last 168 hours. No results for input(s): AMMONIA in the last 168 hours. Coagulation Profile: Recent Labs  Lab 10/13/17 2110  INR 1.07   Cardiac Enzymes: Recent Labs  Lab 10/14/17 0158 10/14/17 0629 10/14/17 1314  TROPONINI 0.13* 0.09* 0.08*   BNP (last 3 results) Recent Labs    08/17/17 1831  PROBNP 541.0*   HbA1C: No results for input(s): HGBA1C in the last 72 hours. CBG: Recent Labs  Lab 10/18/17 1145 10/18/17 1652 10/18/17 2302 10/19/17 0729 10/19/17 1140  GLUCAP 182* 218* 207* 126* 141*   Lipid Profile: No results for input(s): CHOL, HDL, LDLCALC, TRIG, CHOLHDL, LDLDIRECT in the last 72 hours. Thyroid Function Tests: No results for input(s): TSH, T4TOTAL, FREET4, T3FREE, THYROIDAB in the last 72 hours. Anemia Panel: Recent Labs    10/18/17 1402  FERRITIN 137  TIBC 295  IRON 71   Sepsis Labs: Recent Labs  Lab 10/13/17 2119 10/13/17 2353  LATICACIDVEN 1.27 1.71    Recent Results (from the past 240 hour(s))  Culture, blood (Routine x 2)     Status: None (Preliminary result)   Collection Time: 10/13/17  7:10 PM  Result Value Ref Range Status   Specimen Description BLOOD RIGHT ANTECUBITAL  Final   Special Requests   Final    BOTTLES DRAWN AEROBIC AND ANAEROBIC Blood Culture adequate volume   Culture   Final    NO GROWTH 4 DAYS Performed at [facility], [place] [address], [place], [place] [address]    Report Status PENDING   Incomplete  Culture, blood (Routine x 2)     Status: None (Preliminary result)   Collection Time: 10/13/17  9:20 PM  Result Value Ref Range Status   Specimen Description BLOOD LEFT HAND  Final   Special Requests   Final  BOTTLES DRAWN AEROBIC ONLY Blood Culture adequate volume   Culture   Final    NO GROWTH 4 DAYS Performed at [facility], [place] [address], [place], [place] [address]    Report Status PENDING  Incomplete  Culture, blood (routine x 2) Call MD if unable to obtain prior to antibiotics being given     Status: None   Collection Time: 10/14/17  1:58 AM  Result Value Ref Range Status   Specimen Description BLOOD LEFT HAND  Final   Special Requests IN PEDIATRIC BOTTLE Blood Culture adequate volume  Final   Culture   Final    NO GROWTH 5 DAYS Performed at [facility], [place] [address], [place], [place] [address]    Report Status 10/19/2017 FINAL  Final  Culture, blood (routine x 2) Call MD if unable to obtain prior to antibiotics being given     Status: None   Collection Time: 10/14/17  1:58 AM  Result Value Ref Range Status   Specimen Description BLOOD RIGHT HAND  Final   Special Requests   Final    BOTTLES DRAWN AEROBIC AND ANAEROBIC Blood Culture adequate volume   Culture   Final    NO GROWTH 5 DAYS Performed at [facility], [place] [address] [place], [place] [address]    Report Status 10/19/2017 FINAL  Final  MRSA PCR Screening     Status: None   Collection Time: 10/15/17 10:10 AM  Result Value Ref Range Status   MRSA by PCR NEGATIVE NEGATIVE Final    Comment:        The GeneXpert MRSA Assay (FDA approved for NASAL specimens only), is one component of a comprehensive MRSA colonization surveillance program. It is not intended to diagnose MRSA infection nor to guide or monitor treatment for MRSA infections.          Radiology Studies: No results found.      Scheduled Meds: . aspirin EC  81 mg Oral Daily  . atorvastatin  40 mg  Oral q1800  . budesonide (PULMICORT) nebulizer solution  0.5 mg Nebulization BID  . carvedilol  3.125 mg Oral BID  . enoxaparin (LOVENOX) injection  30 mg Subcutaneous Q24H  . furosemide  40 mg Intravenous BID  . hydrALAZINE  10 mg Oral Q8H  . insulin aspart  0-20 Units Subcutaneous TID WC  . insulin aspart  0-5 Units Subcutaneous QHS  . insulin aspart  10 Units Subcutaneous TID WC  . insulin glargine  34 Units Subcutaneous QHS  . ipratropium-albuterol  3 mL Nebulization TID  . isosorbide mononitrate  30 mg Oral Daily  . levofloxacin  750 mg Oral Q48H  . magnesium oxide  400 mg Oral BID  . predniSONE  60 mg Oral QAC breakfast  . vitamin B-12  1,000 mcg Oral Daily   Continuous Infusions:   LOS: 6 days    Time spent: over 30 minutes    Fayrene Helper, MD [facility] Pager [phone]  If 7PM-7AM, please contact night-coverage [email or web address] Password [email or web address] 10/19/2017, 2:51 PM

## 2017-10-19 NOTE — Evaluation (Signed)
Occupational Therapy Evaluation Patient Details Name: Candace Long MRN: [id number] DOB: Jun 05, 1946 Today's Date: 10/19/2017    History of Present Illness 71 yo female admitted with Pna, tachycardia, ARF. Hx of DM, CKD, HTN, cerebral aneurysm repair, obesity   Clinical Impression   Pt ist set up-sup level with safety and sup - min guard A level with ADL mobility. Pt will have assist/sup at home prn. All education completed and no further acute OT is indicated at this time    Follow Up Recommendations  Supervision - Intermittent(sup for safety)    Equipment Recommendations  None recommended by OT    Recommendations for Other Services       Precautions / Restrictions Precautions Precautions: Fall Restrictions Weight Bearing Restrictions: No      Mobility Bed Mobility               General bed mobility comments: sitting EOB upon OT arrival  Transfers Overall transfer level: Needs assistance Equipment used: Rolling walker (2 wheeled) Transfers: Sit to/from Stand Sit to Stand: Supervision         General transfer comment: for safety.    Balance Overall balance assessment: Needs assistance Sitting-balance support: No upper extremity supported;Feet supported Sitting balance-Leahy Scale: Good     Standing balance support: Bilateral upper extremity supported;During functional activity Standing balance-Leahy Scale: Fair                             ADL either performed or assessed with clinical judgement   ADL Overall ADL's : Needs assistance/impaired     Grooming: Wash/dry hands;Wash/dry face;Standing;Supervision/safety   Upper Body Bathing: Set up;Sitting;Supervision/ safety   Lower Body Bathing: Set up;Supervison/ safety;Sit to/from stand   Upper Body Dressing : Set up;Sitting   Lower Body Dressing: Set up;Supervision/safety;Sit to/from stand   Toilet Transfer: Supervision/safety;Min guard;RW;Grab bars;Ambulation;Comfort height toilet    Toileting- Clothing Manipulation and Hygiene: Supervision/safety   Tub/ Shower Transfer: Supervision/safety;Min guard;Ambulation;Grab bars   Functional mobility during ADLs: Supervision/safety;Min guard       Vision Patient Visual Report: No change from baseline       Perception     Praxis      Pertinent Vitals/Pain Pain Assessment: No/denies pain     Hand Dominance Right   Extremity/Trunk Assessment Upper Extremity Assessment Upper Extremity Assessment: Overall WFL for tasks assessed   Lower Extremity Assessment Lower Extremity Assessment: Defer to PT evaluation   Cervical / Trunk Assessment Cervical / Trunk Assessment: Normal   Communication Communication Communication: No difficulties   Cognition Arousal/Alertness: Awake/alert Behavior During Therapy: WFL for tasks assessed/performed Overall Cognitive Status: History of cognitive impairments - at baseline                                 General Comments: per pt, she has memory issues   General Comments   pt very pleasant and cooperative, talkative    Exercises     Shoulder Instructions      Home Living Family/patient expects to be discharged to:: Private residence Living Arrangements: Children Available Help at Discharge: Family;Available PRN/intermittently Type of Home: House Home Access: Stairs to enter [employer] of Steps: 3 Entrance Stairs-Rails: None Home Layout: One level     Bathroom Shower/Tub: Teacher, early years/pre: Standard     Home Equipment: None          Prior Functioning/Environment  Level of Independence: Needs assistance  Gait / Transfers Assistance Needed: Son sometimes helps with stairs. Otherwise independent.  ADL's / Homemaking Assistance Needed: reports that she was independent with selfcare at home            OT Problem List: Decreased activity tolerance      OT Treatment/Interventions:      OT Goals(Current goals can  be found in the care plan section) Acute Rehab OT Goals Patient Stated Goal: go home OT Goal Formulation: With patient Time For Goal Achievement: 10/26/17 Potential to Achieve Goals: Good  OT Frequency:     Barriers to D/C:    no barriers       Co-evaluation              AM-PAC PT "6 Clicks" Daily Activity     Outcome Measure Help from another person eating meals?: None Help from another person taking care of personal grooming?: A Little Help from another person toileting, which includes using toliet, bedpan, or urinal?: A Little Help from another person bathing (including washing, rinsing, drying)?: A Little Help from another person to put on and taking off regular upper body clothing?: A Little Help from another person to put on and taking off regular lower body clothing?: A Little 6 Click Score: 19   End of Session Equipment Utilized During Treatment: Gait belt;Rolling walker  Activity Tolerance: Patient tolerated treatment well Patient left: in bed(sitting EOB)  OT Visit Diagnosis: Muscle weakness (generalized) (M62.81)                Time: 7867-6720 OT Time Calculation (min): 17 min Charges:  OT General Charges $OT Visit: 1 Visit OT Evaluation $OT Eval Moderate Complexity: 1 Mod G-Codes: OT G-codes **NOT FOR INPATIENT CLASS** Functional Assessment Tool Used: AM-PAC 6 Clicks Daily Activity     Britt Bottom 10/19/2017, 12:31 PM

## 2017-10-20 ENCOUNTER — Ambulatory Visit: Payer: Medicare Other | Admitting: Cardiology

## 2017-10-20 LAB — BASIC METABOLIC PANEL
Anion gap: 12 (ref 5–15)
BUN: 107 mg/dL — AB (ref 6–20)
CHLORIDE: 103 mmol/L (ref 101–111)
CO2: 26 mmol/L (ref 22–32)
CREATININE: 2.25 mg/dL — AB (ref 0.44–1.00)
Calcium: 9.1 mg/dL (ref 8.9–10.3)
GFR calc Af Amer: 24 mL/min — ABNORMAL LOW (ref >60)
GFR calc non Af Amer: 21 mL/min — ABNORMAL LOW (ref >60)
GLUCOSE: 53 mg/dL — AB (ref 65–99)
Potassium: 3.7 mmol/L (ref 3.5–5.1)
SODIUM: 141 mmol/L (ref 135–145)

## 2017-10-20 LAB — GLUCOSE, CAPILLARY
GLUCOSE-CAPILLARY: 38 mg/dL — AB (ref 65–99)
GLUCOSE-CAPILLARY: 185 mg/dL — AB (ref 65–99)
Glucose-Capillary: 77 mg/dL (ref 65–99)
Glucose-Capillary: 77 mg/dL (ref 65–99)
Glucose-Capillary: 128 mg/dL — ABNORMAL HIGH (ref 65–99)

## 2017-10-20 LAB — CULTURE, BLOOD (ROUTINE X 2)
CULTURE: NO GROWTH
CULTURE: NO GROWTH
Special Requests: ADEQUATE
Special Requests: ADEQUATE

## 2017-10-20 LAB — CBC
HCT: 32.7 % — ABNORMAL LOW (ref 36.0–46.0)
HEMOGLOBIN: 10.5 g/dL — AB (ref 12.0–15.0)
MCH: 29.2 pg (ref 26.0–34.0)
MCHC: 32.1 g/dL (ref 30.0–36.0)
MCV: 91.1 fL (ref 78.0–100.0)
Platelets: 529 10*3/uL — ABNORMAL HIGH (ref 150–400)
RBC: 3.59 MIL/uL — ABNORMAL LOW (ref 3.87–5.11)
RDW: 14.8 % (ref 11.5–15.5)
WBC: 2.4 10*3/uL — ABNORMAL LOW (ref 4.0–10.5)

## 2017-10-20 MED ORDER — FUROSEMIDE 40 MG PO TABS
40.0000 mg | ORAL_TABLET | Freq: Two times a day (BID) | ORAL | Status: DC
  Administered 2017-10-21: 40 mg via ORAL
  Filled 2017-10-20: qty 1

## 2017-10-20 MED ORDER — INSULIN GLARGINE 100 UNIT/ML ~~LOC~~ SOLN
28.0000 [IU] | Freq: Every day | SUBCUTANEOUS | Status: DC
  Administered 2017-10-20: 28 [IU] via SUBCUTANEOUS
  Filled 2017-10-20 (×2): qty 0.28

## 2017-10-20 NOTE — Progress Notes (Signed)
PROGRESS NOTE    Candace Long  MVH:[id number] DOB: 10-Jun-1946 DOA: 10/13/2017 PCP: Ria Bush, MD   Brief Narrative:  Candace Long  is Candace Long 71 y.o. female, w  Dm2, hypertension, hyperlipidemia, ckd stage 3, cerebral aneurysm repair,  C/o feeling poorly. + wheezing, slight confusion.  Slight cough , sob.    Denies fever, chills, cp, palp , n/v, diarrhea, brbpr.  Pt does note some slight cp under her neck with cough.    She presented with SOB and concern for HCAP.     Assessment & Plan:   Principal Problem:   Healthcare-associated pneumonia Active Problems:   Hypertension   Controlled type 2 diabetes mellitus with diabetic nephropathy (HCC)   HLD (hyperlipidemia)   Severe obesity (BMI 35.0-39.9) with comorbidity (HCC)   Chronic kidney disease, stage III (moderate) (HCC)   Vitamin D deficiency   Vitamin B12 deficiency   Elevated troponin   Acute systolic CHF (congestive heart failure) (HCC)   Tachycardia   Acute kidney injury superimposed on CKD ([place]) Stage 3   HCAP (healthcare-associated pneumonia)   1 healthcare associated pneumonia Patient presented with complaints of feeling poorly, some wheezing, cough, shortness of breath. Chest x-ray done concerning for probable pneumonia.  D-dimer was obtained which was elevated, VQ scan which was done showed intermediate probability, but in setting of improved resp status and findings c/f pneumonia think this is less likely.   Urine Legionella antigen negative.  Urine pneumococcus antigen is negative.  MRSA PCR is negative.  Continue nebulizer treatments, IV vancomycin and cefepime have been discontinued.  Patient has been transitioned to oral Levaquin to complete course of treatment. Taper steroids.  #2 acute renal failure on chronic kidney disease stage III--- baseline creatinine 1.5.  Now 2.25. improving from 3.10.  Renal following.  Suspected due to prerenal azotemia, secondary to acute systolic CHF exacerbation. Renal  ultrasound negative for hydronephrosis however does have some echogenicity worrisome for chronic medical renal disease.  Repeat chest x-ray with some improvement.  Nephrology following.   Has been converted to PO lasix to start tomorrow I/O Daily weights Will need f/u with nephrology as outpatient.  #3 probable acute on chronic systolic heart failure Patient had presented with worsening shortness of breath. Patient noted on physical exam to have lower extremity edema, positive JVD.  Current weight is 110.9 kg Today. From 114.7 kg from 103.7 kg on admission.  Elevated troponin on admission, suspected due to demand.  2-D echo from 08/20/2017 with Candace Long EF of 40%, diffuse hypokinesis. Patient's metformin has been discontinued on admission.  Diuresis per nephrology I/O, daily weights  #4 tachycardia Questionable etiology. Resolved. D-dimer was elevated and as such VQ scan was obtained which showed intermediate probability ventilation/perfusion study for PE. Low suspicion for PE, more likely symptoms secondary to pneumonia and probable CHF exacerbation. Tachycardia improved. Continue Coreg.  #5 hypertension Stable. Doses of antihypertensive medications has been adjusted per nephrology for better renal perfusion. Continue current regimen of Coreg, hydralazine, Norvasc, imdur.  #6 hyperlipidemia Fasting lipid panel from 08/20/2017 with Candace Long LDL of 67, cholesterol of 118. Continue statin.  #7 cardiomyopathy Currently stable. Continue cardiac regimen of Coreg, hydralazine, Imdur, Norvasc, statin. On diuretics.  #8 diabetes mellitus type 2 Hemoglobin A1c 6.3.  Hypoglycemia this morning.  Decreased lantus to 28 units from 34 units Continue 10 units mealtime insulin TID.  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  Candace Long&Ox1 which is baseline per son.  CTM.   DVT prophylaxis: lovenox Code Status: full  Family Communication: discussed with son,  Juanda Crumble Disposition Plan: pending improvement  Consultants:   nephrology  Procedures: (Don't include imaging studies which can be auto populated. Include things that cannot be auto populated i.e. Echo, Carotid and venous dopplers, Foley, Bipap, HD, tubes/drains, wound vac, central lines etc)  V/Q scan  Antimicrobials: (specify start and planned stop date. Auto populated tables are space occupying and do not give end dates) Anti-infectives (From admission, onward)   Start     Dose/Rate Route Frequency Ordered Stop   10/18/17 1000  levofloxacin (LEVAQUIN) tablet 750 mg  Status:  Discontinued     750 mg Oral Every 48 hours 10/17/17 1725 10/20/17 1349   10/14/17 2200  vancomycin (VANCOCIN) IVPB 750 mg/150 ml premix  Status:  Discontinued     750 mg 150 mL/hr over 60 Minutes Intravenous Every 24 hours 10/14/17 0133 10/15/17 1000   10/14/17 0600  levofloxacin (LEVAQUIN) IVPB 750 mg  Status:  Discontinued     750 mg 100 mL/hr over 90 Minutes Intravenous Every 48 hours 10/14/17 0045 10/17/17 1725   10/14/17 0115  vancomycin (VANCOCIN) IVPB 1000 mg/200 mL premix     1,000 mg 200 mL/hr over 60 Minutes Intravenous NOW 10/14/17 0109 10/14/17 0222   10/13/17 2115  ceFEPIme (MAXIPIME) 2 g in dextrose 5 % 50 mL IVPB     2 g 100 mL/hr over 30 Minutes Intravenous  Once 10/13/17 2106 10/13/17 2157   10/13/17 2115  vancomycin (VANCOCIN) IVPB 1000 mg/200 mL premix     1,000 mg 200 mL/hr over 60 Minutes Intravenous  Once 10/13/17 2106 10/13/17 2330     Subjective: Feeling well. No complaints.  No CP, no SOB.  Objective: Vitals:   10/19/17 2152 10/20/17 0300 10/20/17 0756 10/20/17 1346  BP: (!) 165/95 (!) 173/96  115/72  Pulse: 71 63  60  Resp: 20 18    Temp: 97.7 F (36.5 C) (!) 97.5 F (36.4 C)  97.8 F (36.6 C)  TempSrc: Oral Oral  Oral  SpO2: 99% 100% 93% 100%  Weight:  110.9 kg (244 lb 6.4 oz)    Height:        Intake/Output Summary (Last 24 hours) at 10/20/2017 1351 Last data  filed at 10/20/2017 1120 Gross per 24 hour  Intake 840 ml  Output 4475 ml  Net -3635 ml   Filed Weights   10/17/17 0520 10/19/17 0514 10/20/17 0300  Weight: 115.8 kg (255 lb 6.4 oz) 113.8 kg (250 lb 14.1 oz) 110.9 kg (244 lb 6.4 oz)    Examination:  General: No acute distress. Cardiovascular: Heart sounds show Candace Long regular rate, and rhythm. No gallops or rubs. No murmurs. No JVD. Lungs: Clear to auscultation bilaterally with good air movement. No rales, rhonchi or wheezes. Abdomen: Soft, nontender, nondistended with normal active bowel sounds. No masses. No hepatosplenomegaly. Neurological: Alert. Moves all extremities 4 with equal strength. Cranial nerves II through XII grossly intact. Skin: Warm and dry. No rashes or lesions. Extremities: No clubbing or cyanosis. Trace edema. Pedal pulses 2+. Psychiatric: Mood and affect are normal. Insight and judgment are appropriate.   Data Reviewed: I have personally reviewed following labs and imaging studies  CBC: Recent Labs  Lab 10/14/17 0905 10/15/17 0656 10/16/17 0643 10/17/17 0555 10/18/17 0636 10/20/17 0552  WBC 1.5* 2.2* 1.6* 1.6* 1.6* 2.4*  NEUTROABS 0.8* 1.0* 0.8* 1.0* 1.1*  --   HGB 9.4* 8.9* 8.7* 8.8* 9.2* 10.5*  HCT 29.9* 27.9* 27.2* 27.5* 27.8* 32.7*  MCV 94.6 93.9 92.5 90.5 90.3 91.1  PLT 439* 469* 437* 441* 495* 382*   Basic Metabolic Panel: Recent Labs  Lab 10/14/17 0905 10/15/17 0656 10/16/17 0643 10/17/17 0555 10/18/17 0636 10/19/17 0601 10/20/17 0552  NA 133* 132* 128* 130* 134* 139 141  K 4.6 4.5 4.6 4.5 4.3 4.1 3.7  CL 103 98* 96* 99* 99* 102 103  CO2 18* 22 21* 20* 23 25 26   GLUCOSE 295* 208* 164* 181* 174* 145* 53*  BUN 38* 63* 79* 98* 111* 109* 107*  CREATININE 2.00* 2.95* 3.10* 3.02* 2.84* 2.58* 2.25*  CALCIUM 8.4* 8.7* 8.6* 8.7* 8.9 9.1 9.1  MG 1.5* 2.7*  --   --   --  2.8*  --   PHOS  --   --   --  4.8*  --  4.8*  --    GFR: Estimated Creatinine Clearance: 31.3 mL/min (Kamyiah Colantonio) (by C-G formula  based on SCr of 2.25 mg/dL (H)). Liver Function Tests: Recent Labs  Lab 10/13/17 2110 10/17/17 0555 10/19/17 0601  AST 24  --   --   ALT 18  --   --   ALKPHOS 114  --   --   BILITOT 0.9  --   --   PROT 7.3  --   --   ALBUMIN 3.4* 3.2* 3.1*   No results for input(s): LIPASE, AMYLASE in the last 168 hours. No results for input(s): AMMONIA in the last 168 hours. Coagulation Profile: Recent Labs  Lab 10/13/17 2110  INR 1.07   Cardiac Enzymes: Recent Labs  Lab 10/14/17 0158 10/14/17 0629 10/14/17 1314  TROPONINI 0.13* 0.09* 0.08*   BNP (last 3 results) Recent Labs    08/17/17 1831  PROBNP 541.0*   HbA1C: No results for input(s): HGBA1C in the last 72 hours. CBG: Recent Labs  Lab 10/19/17 1653 10/19/17 2052 10/20/17 0709 10/20/17 0736 10/20/17 1140  GLUCAP 173* 156* 38* 77 77   Lipid Profile: No results for input(s): CHOL, HDL, LDLCALC, TRIG, CHOLHDL, LDLDIRECT in the last 72 hours. Thyroid Function Tests: No results for input(s): TSH, T4TOTAL, FREET4, T3FREE, THYROIDAB in the last 72 hours. Anemia Panel: Recent Labs    10/18/17 1402  FERRITIN 137  TIBC 295  IRON 71   Sepsis Labs: Recent Labs  Lab 10/13/17 2119 10/13/17 2353  LATICACIDVEN 1.27 1.71    Recent Results (from the past 240 hour(s))  Culture, blood (Routine x 2)     Status: None (Preliminary result)   Collection Time: 10/13/17  7:10 PM  Result Value Ref Range Status   Specimen Description BLOOD RIGHT ANTECUBITAL  Final   Special Requests   Final    BOTTLES DRAWN AEROBIC AND ANAEROBIC Blood Culture adequate volume   Culture   Final    NO GROWTH 4 DAYS Performed at [facility], [place] [address], [place], [place] [address]    Report Status PENDING  Incomplete  Culture, blood (Routine x 2)     Status: None (Preliminary result)   Collection Time: 10/13/17  9:20 PM  Result Value Ref Range Status   Specimen Description BLOOD LEFT HAND  Final   Special Requests   Final    BOTTLES  DRAWN AEROBIC ONLY Blood Culture adequate volume   Culture   Final    NO GROWTH 4 DAYS Performed at [facility], [place] [address] [place], [place] [address]    Report Status PENDING  Incomplete  Culture, blood (routine x 2) Call MD if unable to obtain prior to antibiotics being given     Status: None   Collection Time: 10/14/17  1:58 AM  Result Value Ref Range Status   Specimen Description BLOOD LEFT HAND  Final   Special Requests IN PEDIATRIC BOTTLE Blood Culture adequate volume  Final   Culture   Final    NO GROWTH 5 DAYS Performed at [place] [address], [place], [place] [address]    Report Status 10/19/2017 FINAL  Final  Culture, blood (routine x 2) Call MD if unable to obtain prior to antibiotics being given     Status: None   Collection Time: 10/14/17  1:58 AM  Result Value Ref Range Status   Specimen Description BLOOD RIGHT HAND  Final   Special Requests   Final    BOTTLES DRAWN AEROBIC AND ANAEROBIC Blood Culture adequate volume   Culture   Final    NO GROWTH 5 DAYS Performed at [facility], [place] [address], [place], [place] [address]    Report Status 10/19/2017 FINAL  Final  MRSA PCR Screening     Status: None   Collection Time: 10/15/17 10:10 AM  Result Value Ref Range Status   MRSA by PCR NEGATIVE NEGATIVE Final    Comment:        The GeneXpert MRSA Assay (FDA approved for NASAL specimens only), is one component of [facility] comprehensive MRSA colonization surveillance program. It is not intended to diagnose MRSA infection nor to guide or monitor treatment for MRSA infections.          Radiology Studies: No results found.      Scheduled Meds: . aspirin EC  81 mg Oral Daily  . atorvastatin  40 mg Oral q1800  . budesonide (PULMICORT) nebulizer solution  0.5 mg Nebulization BID  . carvedilol  3.125 mg Oral BID  . enoxaparin (LOVENOX) injection  30 mg Subcutaneous Q24H  . furosemide  40 mg Intravenous BID  . [START ON 10/21/2017]  furosemide  40 mg Oral BID  . hydrALAZINE  10 mg Oral Q8H  . insulin aspart  0-20 Units Subcutaneous TID WC  . insulin aspart  0-5 Units Subcutaneous QHS  . insulin aspart  10 Units Subcutaneous TID WC  . insulin glargine  34 Units Subcutaneous QHS  . ipratropium-albuterol  3 mL Nebulization BID  . isosorbide mononitrate  30 mg Oral Daily  . magnesium oxide  400 mg Oral BID  . predniSONE  40 mg Oral QAC breakfast  . vitamin B-12  1,000 mcg Oral Daily   Continuous Infusions:   LOS: 7 days    Time spent: over 90minutes    Fayrene Helper, MD [facility] Pager [phone]  If 7PM-7AM, please contact night-coverage [email or web address] Password [email or web address] 10/20/2017, 1:51 PM

## 2017-10-20 NOTE — Progress Notes (Signed)
Critical lab value  CBG= 38  Asymptomatic Given OJ and gingerale  Last CBG was 156 at 2100 last pm and received her nightly 34 units [place]  Repeat CBG =

## 2017-10-20 NOTE — Progress Notes (Signed)
Date: October 20, 2017 Rhonda Davis, BSN, RN3, CCM  [phone] Chart and notes review for patient progress and needs. Will follow for case management and discharge needs. Next review date: [id number] 

## 2017-10-20 NOTE — Progress Notes (Signed)
Inpatient Diabetes Program Recommendations  AACE/ADA: New Consensus Statement on Inpatient Glycemic Control (2015)  Target Ranges:  Prepandial:   less than 140 mg/dL      Peak postprandial:   less than 180 mg/dL (1-2 hours)      Critically ill patients:  140 - 180 mg/dL   Lab Results  Component Value Date   GLUCAP 77 10/20/2017   HGBA1C 6.3 (H) 10/14/2017    Review of Glycemic Control  Hypoglycemia this am - 53 mg/dL Needs Lantus adjustment.  Inpatient Diabetes Program Recommendations:   Decrease Lantus to 30 units QHS  Continue to follow.  Thank you. Lorenda Peck, RD, LDN, CDE Inpatient Diabetes Coordinator [phone]

## 2017-10-20 NOTE — Progress Notes (Signed)
Patient ID: Candace Long, female   DOB: [birth year], 71 y.o.   MRN: [id number] [facility] Progress Note   Assessment/ Plan:   1. Acute on CRF (baseline chronic kidney disease stage IIIB-like a diabetic/hypertensive nephropathy)- baseline creatinine ~1.5 (EGFR 42 mL/minute). She continues to have an excellent response to diuretic therapy and I plan to convert her to oral diuretics today based on physical exam findings and continued renal recovery. 2. Possible community-acquired pneumonia- on levaquin and remains afebrile. Suspect that both CHF exacerbation and lower respiratory tract infection may have been contributing to presentation symptoms. Clinically doing better-wean steroids off as quickly as practicable. 3. Hyponatremia: Secondary to free water excretion defect of acute kidney injury as well as the impact of CHF exacerbation/ADH activation. Sodium level now within normal limits with aggressive diuresis/improving renal function. 4. HTN - blood pressures appear to be intermittently elevated, monitor with ongoing diuretic therapy  5. DM on insulin 6. Anemia: Possibly anemia of chronic kidney disease versus acute illness/dilutional with volume overload. Borderline iron saturations, continue to monitor. 7. Hx cerebral aneurysm repair  I have updated the discharge section with follow-up details with myself in the renal clinic whenever she is discharged.  Subjective:   She denies any chest pain and states that she continues to feel better with improvement of shortness of breath    Objective:   BP (!) 173/96 (BP Location: Left Arm)   Pulse 63   Temp (!) 97.5 F (36.4 C) (Oral)   Resp 18   Ht 5' 9"  (1.753 m)   Wt 110.9 kg (244 lb 6.4 oz)   SpO2 93%   BMI 36.09 kg/m   Intake/Output Summary (Last 24 hours) at 10/20/2017 1110 Last data filed at 10/20/2017 1000 Gross per 24 hour  Intake 1080 ml  Output 4800 ml  Net -3720 ml   Weight change: -2.941 kg (-7.7 oz)  Physical  Exam: Gen: Comfortably sitting up in recliner watching television CVS: Pulse regular rhythm, normal rate, S1 and S2 normal Resp: Diminished breath sounds over bases, no distinct rales or rhonchi audible Abd: Soft, obese, nontender Ext: Trace-1+ pitting bilateral lower extremity edema with chronic venous stasis dermopathy  Imaging: No results found.  Labs: BMET Recent Labs  Lab 10/14/17 0905 10/15/17 0656 10/16/17 0643 10/17/17 0555 10/18/17 0636 10/19/17 0601 10/20/17 0552  NA 133* 132* 128* 130* 134* 139 141  K 4.6 4.5 4.6 4.5 4.3 4.1 3.7  CL 103 98* 96* 99* 99* 102 103  CO2 18* 22 21* 20* 23 25 26   GLUCOSE 295* 208* 164* 181* 174* 145* 53*  BUN 38* 63* 79* 98* 111* 109* 107*  CREATININE 2.00* 2.95* 3.10* 3.02* 2.84* 2.58* 2.25*  CALCIUM 8.4* 8.7* 8.6* 8.7* 8.9 9.1 9.1  PHOS  --   --   --  4.8*  --  4.8*  --    CBC Recent Labs  Lab 10/15/17 0656 10/16/17 0643 10/17/17 0555 10/18/17 0636 10/20/17 0552  WBC 2.2* 1.6* 1.6* 1.6* 2.4*  NEUTROABS 1.0* 0.8* 1.0* 1.1*  --   HGB 8.9* 8.7* 8.8* 9.2* 10.5*  HCT 27.9* 27.2* 27.5* 27.8* 32.7*  MCV 93.9 92.5 90.5 90.3 91.1  PLT 469* 437* 441* 495* 529*    Medications:    . aspirin EC  81 mg Oral Daily  . atorvastatin  40 mg Oral q1800  . budesonide (PULMICORT) nebulizer solution  0.5 mg Nebulization BID  . carvedilol  3.125 mg Oral BID  . enoxaparin (LOVENOX) injection  30 mg Subcutaneous Q24H  . furosemide  40 mg Intravenous BID  . hydrALAZINE  10 mg Oral Q8H  . insulin aspart  0-20 Units Subcutaneous TID WC  . insulin aspart  0-5 Units Subcutaneous QHS  . insulin aspart  10 Units Subcutaneous TID WC  . insulin glargine  34 Units Subcutaneous QHS  . ipratropium-albuterol  3 mL Nebulization BID  . isosorbide mononitrate  30 mg Oral Daily  . levofloxacin  750 mg Oral Q48H  . magnesium oxide  400 mg Oral BID  . predniSONE  40 mg Oral QAC breakfast  . vitamin B-12  1,000 mcg Oral Daily   Elmarie Shiley, MD 10/20/2017,  11:10 AM

## 2017-10-20 NOTE — Progress Notes (Signed)
[id number] Teodoro Kil, [place] will be done through [facility] due to being open with this company per [place] with [place].

## 2017-10-21 LAB — BASIC METABOLIC PANEL
Anion gap: 8 (ref 5–15)
BUN: 93 mg/dL — ABNORMAL HIGH (ref 6–20)
CALCIUM: 8.8 mg/dL — AB (ref 8.9–10.3)
CO2: 31 mmol/L (ref 22–32)
CREATININE: 2.14 mg/dL — AB (ref 0.44–1.00)
Chloride: 104 mmol/L (ref 101–111)
GFR, EST AFRICAN AMERICAN: 26 mL/min — AB (ref >60)
GFR, EST NON AFRICAN AMERICAN: 22 mL/min — AB (ref >60)
Glucose, Bld: 46 mg/dL — ABNORMAL LOW (ref 65–99)
Potassium: 3.5 mmol/L (ref 3.5–5.1)
SODIUM: 143 mmol/L (ref 135–145)

## 2017-10-21 LAB — CBC
HCT: 29.9 % — ABNORMAL LOW (ref 36.0–46.0)
HEMOGLOBIN: 9.6 g/dL — AB (ref 12.0–15.0)
MCH: 29.4 pg (ref 26.0–34.0)
MCHC: 32.1 g/dL (ref 30.0–36.0)
MCV: 91.4 fL (ref 78.0–100.0)
PLATELETS: 561 10*3/uL — AB (ref 150–400)
RBC: 3.27 MIL/uL — ABNORMAL LOW (ref 3.87–5.11)
RDW: 14.9 % (ref 11.5–15.5)
WBC: 2.4 10*3/uL — ABNORMAL LOW (ref 4.0–10.5)

## 2017-10-21 LAB — GLUCOSE, CAPILLARY
GLUCOSE-CAPILLARY: 30 mg/dL — AB (ref 65–99)
Glucose-Capillary: 65 mg/dL (ref 65–99)
Glucose-Capillary: 126 mg/dL — ABNORMAL HIGH (ref 65–99)
Glucose-Capillary: 273 mg/dL — ABNORMAL HIGH (ref 65–99)

## 2017-10-21 MED ORDER — INSULIN GLARGINE 100 UNIT/ML SOLOSTAR PEN: 20.0000 [IU] | PEN_INJECTOR | Freq: Every day | SUBCUTANEOUS | 3 refills | Status: AC

## 2017-10-21 MED ORDER — HYDRALAZINE HCL 10 MG PO TABS: 10.0000 mg | ORAL_TABLET | Freq: Three times a day (TID) | ORAL | 0 refills | Status: AC

## 2017-10-21 NOTE — Progress Notes (Signed)
Discharge instructions and medications discussed with patient's son.  AVS given to son.  All questions answered.

## 2017-10-21 NOTE — Progress Notes (Signed)
Hypoglycemic Event  CBG: 30  Treatment: Juice  Symptoms:None  Follow-up CBG: Time:851 CBG Result:126  Possible Reasons for Event: Titration of Lantus/Steroids

## 2017-10-21 NOTE — Progress Notes (Signed)
Nutrition Education Note  RD consulted for nutrition education regarding new onset CHF. Pt remains confused at this time.  RD provided "Low Sodium Nutrition Therapy" handout from the [employer] for family to review as pt's son does all of her grocery shopping.  Will try to catch family members for further education if not discharged before then.   No further nutrition interventions warranted at this time. RD contact information provided. If additional nutrition issues arise, please re-consult RD.   Mariana Single RD, LDN Clinical Nutrition Pager # [phone]

## 2017-10-21 NOTE — Progress Notes (Signed)
Physical Therapy Treatment Patient Details Name: Candace Long MRN: [id number] DOB: November 13, 1946 Today's Date: 10/21/2017    History of Present Illness 71 yo female admitted with Pna, tachycardia, ARF. Hx of DM, CKD, HTN, cerebral aneurysm repair, obesity    PT Comments    Progressing with mobility. Walked without an assistive device on today-pt is unsteady. Will recommend continued use of RW for ambulation safety. Will also recommend HHPT f/u, if pt/family are agreeable.    Follow Up Recommendations  Home health PT;Supervision - Intermittent     Equipment Recommendations  Rolling walker with 5" wheels(if pt doesn't already have one)    Recommendations for Other Services       Precautions / Restrictions Precautions Precautions: Fall Restrictions Weight Bearing Restrictions: No    Mobility  Bed Mobility               General bed mobility comments: oob in recliner  Transfers Overall transfer level: Needs assistance Equipment used: None Transfers: Sit to/from Stand Sit to Stand: Supervision         General transfer comment: for safety, lines.   Ambulation/Gait Ambulation/Gait assistance: Min assist Ambulation Distance (Feet): 120 Feet Assistive device: 1 person hand held assist Gait Pattern/deviations: Step-through pattern;Decreased stride length;Drifts right/left;Staggering right;Staggering left     General Gait Details: walked without an assistive device this session. Pt is intermittently unsteady without support of walker. Will recommend RW use for ambulation safety   Stairs            Wheelchair Mobility    Modified Rankin (Stroke Patients Only)       Balance Overall balance assessment: Needs assistance           Standing balance-Leahy Scale: Fair                              Cognition Arousal/Alertness: Awake/alert Behavior During Therapy: WFL for tasks assessed/performed Overall Cognitive Status: History of cognitive  impairments - at baseline                                 General Comments: per pt, she has memory issues      Exercises      General Comments        Pertinent Vitals/Pain Pain Assessment: No/denies pain    Home Living                      Prior Function            PT Goals (current goals can now be found in the care plan section) Progress towards PT goals: Progressing toward goals    Frequency    Min 3X/week      PT Plan Discharge plan needs to be updated    Co-evaluation              AM-PAC PT "6 Clicks" Daily Activity  Outcome Measure  Difficulty turning over in bed (including adjusting bedclothes, sheets and blankets)?: A Little Difficulty moving from lying on back to sitting on the side of the bed? : A Little Difficulty sitting down on and standing up from a chair with arms (e.g., wheelchair, bedside commode, etc,.)?: A Little Help needed moving to and from a bed to chair (including a wheelchair)?: A Little Help needed walking in hospital room?: A Little Help needed climbing 3-5 steps with a  railing? : A Little 6 Click Score: 18    End of Session Equipment Utilized During Treatment: Gait belt Activity Tolerance: Patient tolerated treatment well Patient left: in chair;with call bell/phone within reach;with chair alarm set   PT Visit Diagnosis: Muscle weakness (generalized) (M62.81);Difficulty in walking, not elsewhere classified (R26.2)     Time: 7034-0352 PT Time Calculation (min) (ACUTE ONLY): 11 min  Charges:  $Gait Training: 8-22 mins                    G Codes:          Weston Anna, MPT Pager: [phone]

## 2017-10-21 NOTE — Discharge Summary (Signed)
Physician Discharge Summary  Atheena Long QJJ:[id number] DOB: 09/22/1946 DOA: 10/13/2017  PCP: Ria Bush, MD  Admit date: 10/13/2017 Discharge date: 10/21/2017  Time spent: over 30 minutes  Recommendations for Outpatient Follow-up:  1. Follow up outpatient CBC/CMP 2. Follow up BG's.  Had hypoglycemia on 2 days prior to d/c and lantus was decreased to 20 units at discharge.  Metformin was d/c'd given renal function.  Will need titration of insulin. 3. Ensure is on correct dose of hydralazine.  Pt discharged with 25 mg TID in error, discussed this with son after discharge over phone.  Should be on 10 mg TID Towanna Avery corrected prescription was sent to the [facility] on [address]. 4. Discussed with son that potassium supplementation should be held for now (not given in hospital) while renal function recovering, but will likely need to resume over the next few days.  Recommended f/u with PCP early next week for labs to help determine when to restart potassium.   5. Follow up volume status, follow up with cardiology and nephrology 6. Continue to follow pancytopenia, consider heme f/u   Discharge Diagnoses:  Principal Problem:   Healthcare-associated pneumonia Active Problems:   Hypertension   Controlled type 2 diabetes mellitus with diabetic nephropathy (HCC)   HLD (hyperlipidemia)   Severe obesity (BMI 35.0-39.9) with comorbidity (HCC)   Chronic kidney disease, stage III (moderate) (HCC)   Vitamin D deficiency   Vitamin B12 deficiency   Elevated troponin   Acute systolic CHF (congestive heart failure) (HCC)   Tachycardia   Acute kidney injury superimposed on CKD ([place]) Stage 3   HCAP (healthcare-associated pneumonia)   Discharge Condition: stable  Diet recommendation: heart healthy  Filed Weights   10/19/17 0514 10/20/17 0300 10/21/17 0511  Weight: 113.8 kg (250 lb 14.1 oz) 110.9 kg (244 lb 6.4 oz) 107.7 kg (237 lb 8 oz)    History of present illness:  BettyFaucetteis a71  y.o.female,w Dm2, hypertension, hyperlipidemia, ckd stage 3, cerebral aneurysm repair, C/o feeling poorly. + wheezing, slight confusion. Slight cough , sob. Denies fever, chills, cp, palp , n/v, diarrhea, brbpr. Pt does note some slight cp under her neck with cough.   She presented with SOB and concern for HCAP.  She was treated for HCAP and Candace Long HF exacerbation.  Nephrology was c/s for assistance with diuresis.  She improved with antibiotics and diuresis and was discharged on 11/9.    Hospital Course:  1 healthcare associated pneumonia Patient presented with complaints of feeling poorly, some wheezing, cough, shortness of breath. Chest x-ray done concerning for probable pneumonia.  D-dimer was obtained which was elevated, VQ scan which was done showed intermediate probability, but in setting of improved resp status and findings c/f pneumonia think this is less likely.   Urine Legionella antigen negative.  Urine pneumococcus antigen is negative.  MRSA PCR is negative.  Continue nebulizer treatments, IV vancomycin and cefepime have been discontinued. Patient was transitioned to oral Levaquin to complete course of treatment.  Steroids were tapered and d/c'd on day of discharge.  #2 acute renal failure on chronic kidney disease stage III---baseline creatinine 1.5.  Now 2.14. improving from 3.10.  Renal following.  Suspected due to prerenal azotemia,secondary to acute systolic CHF exacerbation. Renal ultrasound negative for hydronephrosis however does have some echogenicity worrisome for chronic medical renal disease.  Repeat chest x-ray with some improvement.  Nephrology following.   Has been converted to PO lasix I/O Daily weights Will need f/u with nephrology as outpatient.  #3 probable  acute on chronic systolic heart failure Patient had presented with worsening shortness of breath. Patient noted on physical exam to have lower extremity edema, positive JVD.  Current weight is  107.7 kg Today. From 114.7 kg from 103.7 kg on admission.  Elevated troponin on admission, suspected due to demand.  2-D echo from 08/20/2017 with Candace Long EF of 40%, diffuse hypokinesis.  Diuresis per nephrology I/O, daily weights  #4 tachycardia Questionable etiology. Resolved. D-dimer was elevated and as such VQ scan was obtained which showed intermediate probability ventilation/perfusion study for PE. Low suspicion for PE, more likely symptoms secondary to pneumonia and probable CHF exacerbation.Tachycardia improved.Continue Coreg.  #5 hypertension Stable. Doses of antihypertensive medications has been adjusted per nephrology for better renal perfusion. Continue current regimen of Coreg, hydralazine, Norvasc, imdur.  #6 hyperlipidemia Fasting lipid panel from 08/20/2017 with Bolton Canupp LDL of 67, cholesterol of 118. Continue statin.  #7 cardiomyopathy Currently stable. Continue cardiac regimen of Coreg, hydralazine, Imdur, Norvasc, statin. On diuretics.  #8 diabetes mellitus type 2 Hemoglobin A1c 6.3.  Hypoglycemia this morning.  Decreased lantus to 20 units on discharge from 28 units night prior.  Continue 10 units mealtime insulin TID.  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  Candace Long&Ox1 which is baseline per son.  CTM.   Procedures:  none (i.e. Studies not automatically included, echos, thoracentesis, etc; not x-rays)  Consultations:  nephrology  Discharge Exam: Vitals:   10/21/17 0825 10/21/17 1344  BP:  (!) 118/58  Pulse:  72  Resp:  16  Temp:  98.2 F (36.8 C)  SpO2: 98% 99%   Doing well, no complaints.  Ready to go home.  General: No acute distress. Cardiovascular: Heart sounds show Dorcas Melito regular rate, and rhythm. No gallops or rubs. No murmurs. No JVD. Lungs: Clear to auscultation bilaterally with good air movement. No rales, rhonchi or wheezes. Abdomen: Soft, nontender, nondistended with normal active bowel sounds. No masses.  No hepatosplenomegaly. Neurological: Alert and oriented. Moves all extremities 4 with equal strength. Cranial nerves II through XII grossly intact. Skin: Warm and dry. No rashes or lesions. Extremities: No clubbing or cyanosis. Trace edema. Pedal pulses 2+. Psychiatric: Mood and affect are normal. Insight and judgment are appropriate.  Discharge Instructions   Discharge Instructions    Call MD for:  difficulty breathing, headache or visual disturbances   Complete by:  As directed    Call MD for:  persistant dizziness or light-headedness   Complete by:  As directed    Call MD for:  persistant nausea and vomiting   Complete by:  As directed    Call MD for:  redness, tenderness, or signs of infection (pain, swelling, redness, odor or green/yellow discharge around incision site)   Complete by:  As directed    Call MD for:  severe uncontrolled pain   Complete by:  As directed    Call MD for:  temperature >100.4   Complete by:  As directed    Diet - low sodium heart healthy   Complete by:  As directed    Discharge instructions   Complete by:  As directed    You were seen for pneumonia and heart failure.  Your kidney function was also decreased while you were here as well.  You completed antibiotics for pneumonia.  You breathing and kidney function improved with diuresis (with lasix - removing fluid).  Please follow up with Dr. Posey Pronto from nephrology ([facility]) on 12/26/17  as scheduled.  Please follow up with your PCP within 1 week.  I decreased your lantus from 30 to 20 units nightly because you had low blood sugars in the morning.  Please monitor your blood sugars before meals and before bedtime.  If you have low blood sugars (less than 70), please follow the hypoglycemia instructions in the discharge summary (drink juice or eat something and then recheck).  If your blood sugars are consistently high in the morning and throughout the day over 140, please call your PCP for instruction on  titration of the lantus.  Please discontinue your metformin.  Return if new or worsening or recurrent symptoms.   Increase activity slowly   Complete by:  As directed      Discharge Medication List as of 10/21/2017  3:21 PM    CONTINUE these medications which have CHANGED   Details  Insulin Glargine (LANTUS SOLOSTAR) 100 UNIT/ML Solostar Pen Inject 20 Units daily at 10 pm into the skin., Starting Fri 10/21/2017, Normal      CONTINUE these medications which have NOT CHANGED   Details  albuterol (VENTOLIN HFA) 108 (90 Base) MCG/ACT inhaler Inhale 2 puffs into the lungs every 6 (six) hours as needed for wheezing or shortness of breath., Starting Sat 12/27/2015, Normal    amLODipine (NORVASC) 10 MG tablet TAKE 1 TABLET (10 MG TOTAL) BY MOUTH DAILY., Normal    aspirin EC 81 MG tablet Take 81 mg by mouth daily. , Historical Med    atorvastatin (LIPITOR) 40 MG tablet TAKE 1 TABLET (40 MG TOTAL) BY MOUTH DAILY., Normal    carvedilol (COREG) 3.125 MG tablet Take 1 tablet (3.125 mg total) by mouth 2 (two) times daily., Starting Mon 10/10/2017, Until Sun 01/08/2018, Normal    Cholecalciferol (VITAMIN D) 1000 UNITS capsule Take 1 capsule (1,000 Units total) by mouth daily., Starting Mon 05/27/2014, OTC    magnesium oxide (MAG-OX) 400 (241.3 Mg) MG tablet Take 1 tablet (400 mg total) by mouth 2 (two) times daily., Starting Wed 08/24/2017, Normal    vitamin B-12 (CYANOCOBALAMIN) 1000 MCG tablet Take 1 tablet (1,000 mcg total) by mouth daily., Starting Wed 05/28/2015, OTC    B-D ULTRAFINE III SHORT PEN 31G X 8 MM MISC USE TO INJECT LANTUS ONCE DAILY., Normal    furosemide (LASIX) 40 MG tablet TAKE 1 TABLET BY MOUTH TWICE Nicky Kras DAY, Normal    isosorbide mononitrate (IMDUR) 30 MG 24 hr tablet TAKE 1 TABLET BY MOUTH EVERY DAY, Normal    KLOR-CON M20 20 MEQ tablet TAKE 2 TABLETS (40 MEQ TOTAL) BY MOUTH 2 (TWO) TIMES DAILY., Normal    hydrALAZINE (APRESOLINE) 25 MG tablet TAKE 1 TABLET (25 MG TOTAL) BY MOUTH  EVERY 8 (EIGHT) HOURS., Starting Tue 10/18/2017, Normal      STOP taking these medications     metFORMIN (GLUCOPHAGE) 1000 MG tablet        Allergies  Allergen Reactions  . Cefepime Hives  . Hydrochlorothiazide Other (See Comments)    Hypokalemia, hyponatremia   Follow-up Information    Elmarie Shiley, MD Follow up on 12/26/2017.   Specialty:  Nephrology Why:  Appointment at [facility] will receive instructions to get labs done 1 week prior to your appointment. Contact information: [place] [address] [phone]        [facility] Follow up.   Specialty:  Home Health Services Why:  For home health services Contact information: [address] [place] [address] [phone]  The results of significant diagnostics from this hospitalization (including imaging, microbiology, ancillary and laboratory) are listed below for reference.    Significant Diagnostic Studies: Dg Chest 2 View  Result Date: 10/15/2017 CLINICAL DATA:  71 year old female with [place] Dube history of shortness of breath and altered mental status EXAM: CHEST  2 VIEW COMPARISON:  10/13/2017, 08/19/2017 FINDINGS: Cardiomediastinal silhouette unchanged in size and contour. No evidence of central vascular congestion. Improving aeration of the right chest, with residual mixed interstitial and airspace opacity. No large pleural effusion. Shunt tubing projects over the right chest wall IMPRESSION: Improving right-sided airspace disease. Electronically Signed   By: Corrie Mckusick D.O.   On: 10/15/2017 15:13   Dg Chest 2 View  Result Date: 10/13/2017 CLINICAL DATA:  Shortness of breath and confusion, possible sepsis EXAM: CHEST  2 VIEW COMPARISON:  Chest radiograph 08/19/2017 FINDINGS: There is Aleeyah Bensen catheter coursing along the right chest, possibly Donnarae Rae ventriculostomy catheter. The heart is enlarged. There are patchy hazy opacities in the right lung. The left lung is clear. No  pneumothorax or pleural effusion. IMPRESSION: 1. Patchy, hazy airspace opacities in the right lung are suggestive of pneumonia. 2. Cardiomegaly. Electronically Signed   By: Ulyses Jarred M.D.   On: 10/13/2017 22:39   US Renal  Result Date: 10/14/2017 CLINICAL DATA:  Acute renal failure. EXAM: RENAL / URINARY TRACT ULTRASOUND COMPLETE COMPARISON:  None. FINDINGS: Right Kidney: Length: 9.9 cm. Cortical echogenicity is increased. Simple cyst measuring 1.1 cm in the midpole noted. No hydronephrosis visualized. Left Kidney: Length: 9.4 cm. Cortical echogenicity is increased. No mass or hydronephrosis visualized. Bladder: Appears normal for degree of bladder distention. Fibroid uterus noted. IMPRESSION: Negative for hydronephrosis. Increased cortical echogenicity bilaterally is consistent with medical renal disease. Electronically Signed   By: Inge Rise M.D.   On: 10/14/2017 10:59   Nm Pulmonary Perf And Vent  Result Date: 10/14/2017 CLINICAL DATA:  Shortness of breath. EXAM: NUCLEAR MEDICINE VENTILATION - PERFUSION LUNG SCAN TECHNIQUE: Ventilation images were obtained in multiple projections using inhaled aerosol Tc-21m DTPA. Perfusion images were obtained in multiple projections after intravenous injection of Tc-62m MAA. RADIOPHARMACEUTICALS:  30.0 mCi Technetium-57m DTPA aerosol inhalation and 4.8 mCi Technetium-61m MAA IV COMPARISON:  Chest radiograph, 10/13/2017 FINDINGS: Ventilation: There are ventilatory defects that are more evident on the left, involving significant portion of the left lower lobe. There is Joda Braatz segmental appearing ventilatory defect involving the right upper lobe. Perfusion: There are perfusion defects which are matched to the ventilatory abnormalities. On the left, there is relative decreased profusion in the lower lobe, that is not clearly segmental. On the right, there is Marabelle Cushman segmental appearing defect along the posterior upper lobe, which corresponds to an area of consolidation on  the previous day's chest radiograph. IMPRESSION: 1. Intermediate probability ventilation-perfusion study for pulmonary thromboembolism. Based on modified PIOPED criteria, with Jahaira Earnhart moderate to large matching segmental defect noted in the right upper lobe with Jahaira Earnhart matching chest radiographic abnormality (triple match). Ventilatory and perfusion defects on the left are not clearly segmental, and ventilatory defects are larger. Electronically Signed   By: Lajean Manes M.D.   On: 10/14/2017 16:04    Microbiology: Recent Results (from the past 240 hour(s))  Culture, blood (Routine x 2)     Status: None   Collection Time: 10/13/17  7:10 PM  Result Value Ref Range Status   Specimen Description BLOOD RIGHT ANTECUBITAL  Final   Special Requests   Final    BOTTLES DRAWN AEROBIC AND ANAEROBIC  Blood Culture adequate volume   Culture   Final    NO GROWTH 5 DAYS Performed at [facility], [place] [address], [place], [place] [address]    Report Status 10/20/2017 FINAL  Final  Culture, blood (Routine x 2)     Status: None   Collection Time: 10/13/17  9:20 PM  Result Value Ref Range Status   Specimen Description BLOOD LEFT HAND  Final   Special Requests   Final    BOTTLES DRAWN AEROBIC ONLY Blood Culture adequate volume   Culture   Final    NO GROWTH 5 DAYS Performed at [facility], [place] [address], [place], [place] [address]    Report Status 10/20/2017 FINAL  Final  Culture, blood (routine x 2) Call MD if unable to obtain prior to antibiotics being given     Status: None   Collection Time: 10/14/17  1:58 AM  Result Value Ref Range Status   Specimen Description BLOOD LEFT HAND  Final   Special Requests IN PEDIATRIC BOTTLE Blood Culture adequate volume  Final   Culture   Final    NO GROWTH 5 DAYS Performed at [facility], [place] [address] [place], [place] [address]    Report Status 10/19/2017 FINAL  Final  Culture, blood (routine x 2) Call MD if unable to obtain prior to antibiotics  being given     Status: None   Collection Time: 10/14/17  1:58 AM  Result Value Ref Range Status   Specimen Description BLOOD RIGHT HAND  Final   Special Requests   Final    BOTTLES DRAWN AEROBIC AND ANAEROBIC Blood Culture adequate volume   Culture   Final    NO GROWTH 5 DAYS Performed at [facility], [place] [address], [place], [place] [address]    Report Status 10/19/2017 FINAL  Final  MRSA PCR Screening     Status: None   Collection Time: 10/15/17 10:10 AM  Result Value Ref Range Status   MRSA by PCR NEGATIVE NEGATIVE Final    Comment:        The GeneXpert MRSA Assay (FDA approved for NASAL specimens only), is one component of Eesa Justiss comprehensive MRSA colonization surveillance program. It is not intended to diagnose MRSA infection nor to guide or monitor treatment for MRSA infections.      Labs: Basic Metabolic Panel: Recent Labs  Lab 10/15/17 0656  10/17/17 0555 10/18/17 0636 10/19/17 0601 10/20/17 0552 10/21/17 0550  NA 132*   < > 130* 134* 139 141 143  K 4.5   < > 4.5 4.3 4.1 3.7 3.5  CL 98*   < > 99* 99* 102 103 104  CO2 22   < > 20* 23 25 26 31   GLUCOSE 208*   < > 181* 174* 145* 53* 46*  BUN 63*   < > 98* 111* 109* 107* 93*  CREATININE 2.95*   < > 3.02* 2.84* 2.58* 2.25* 2.14*  CALCIUM 8.7*   < > 8.7* 8.9 9.1 9.1 8.8*  MG 2.7*  --   --   --  2.8*  --   --   PHOS  --   --  4.8*  --  4.8*  --   --    < > = values in this interval not displayed.   Liver Function Tests: Recent Labs  Lab 10/17/17 0555 10/19/17 0601  ALBUMIN 3.2* 3.1*   No results for input(s): LIPASE, AMYLASE in the last  168 hours. No results for input(s): AMMONIA in the last 168 hours. CBC: Recent Labs  Lab 10/15/17 0656 10/16/17 0643 10/17/17 0555 10/18/17 0636 10/20/17 0552 10/21/17 0550  WBC 2.2* 1.6* 1.6* 1.6* 2.4* 2.4*  NEUTROABS 1.0* 0.8* 1.0* 1.1*  --   --   HGB 8.9* 8.7* 8.8* 9.2* 10.5* 9.6*  HCT 27.9* 27.2* 27.5* 27.8* 32.7* 29.9*  MCV 93.9 92.5 90.5 90.3 91.1 91.4   PLT 469* 437* 441* 495* 529* 561*   Cardiac Enzymes: No results for input(s): CKTOTAL, CKMB, CKMBINDEX, TROPONINI in the last 168 hours. BNP: BNP (last 3 results) Recent Labs    08/19/17 1305 10/15/17 0654  BNP 566.9* 309.8*    ProBNP (last 3 results) Recent Labs    08/17/17 1831  PROBNP 541.0*    CBG: Recent Labs  Lab 10/20/17 2050 10/21/17 0725 10/21/17 0803 10/21/17 0851 10/21/17 1153  GLUCAP 128* 30* 65 126* 273*       Signed:  Fayrene Helper MD.  [facility] 10/21/2017, 9:24 PM

## 2017-10-21 NOTE — Progress Notes (Signed)
Patient ID: Candace Long, female   DOB: 22-Sep-1946, 71 y.o.   MRN: [id number] [facility] Progress Note   Assessment/ Plan:   1. 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 She continues to show good clinical response to ongoing diuretic therapy with slow renal recovery. Recommend discontinuation of Foley catheter today and allowing her to urinate in the commode with continued quantification of urine in anticipation of discharge in the near future. 2. Possible community-acquired pneumonia- status post levaquin and remains afebrile. Suspected to be bi-factorial shortness of breath from volume overload/pneumonia-weaning off of corticosteroids. 3. Hyponatremia: Secondary to free water excretion defect of acute kidney injury as well as the impact of CHF exacerbation/ADH activation. Now corrected with diuresis and trending towards hypernatremia-should normalize with change to oral furosemide. 4. HTN - blood pressures appear to be intermittently elevated, monitor with ongoing diuretic therapy  5. DM on insulin 6. Anemia: Possibly anemia of chronic kidney disease versus acute illness/dilutional with volume overload. Borderline iron saturations, continue to monitor. 7. Hx cerebral aneurysm repair  I sign off at this time with the recommendations to discharge her home on furosemide 40 mg twice a day and keep her appointment with me as scheduled (1/14, 1 PM).  Subjective:   She denies any chest pain and states that she continues to feel better with improvement of shortness of breath    Objective:   BP (!) 112/57 (BP Location: Right Arm)   Pulse 74   Temp 98.2 F (36.8 C) (Oral)   Resp 18   Ht 5' 9"  (1.753 m)   Wt 107.7 kg (237 lb 8 oz)   SpO2 98%   BMI 35.07 kg/m   Intake/Output Summary (Last 24 hours) at 10/21/2017 1122 Last data filed at 10/21/2017 3818 Gross per 24 hour  Intake  720 ml  Output 2900 ml  Net -2180 ml   Weight change: -3.13 kg (-14.4 oz)  Physical Exam: Gen: Comfortably sitting up in recliner watching television CVS: Pulse regular rhythm, normal rate, S1 and S2 normal Resp: Diminished breath sounds over bases, no distinct rales or rhonchi audible Abd: Soft, obese, nontender Ext: Trace-1+ pitting bilateral lower extremity edema with chronic venous stasis dermopathy  Imaging: No results found.  Labs: BMET Recent Labs  Lab 10/15/17 0656 10/16/17 2993 10/17/17 0555 10/18/17 0636 10/19/17 0601 10/20/17 0552 10/21/17 0550  NA 132* 128* 130* 134* 139 141 143  K 4.5 4.6 4.5 4.3 4.1 3.7 3.5  CL 98* 96* 99* 99* 102 103 104  CO2 22 21* 20* 23 25 26 31   GLUCOSE 208* 164* 181* 174* 145* 53* 46*  BUN 63* 79* 98* 111* 109* 107* 93*  CREATININE 2.95* 3.10* 3.02* 2.84* 2.58* 2.25* 2.14*  CALCIUM 8.7* 8.6* 8.7* 8.9 9.1 9.1 8.8*  PHOS  --   --  4.8*  --  4.8*  --   --    CBC Recent Labs  Lab 10/15/17 0656 10/16/17 0643 10/17/17 0555 10/18/17 0636 10/20/17 0552 10/21/17 0550  WBC 2.2* 1.6* 1.6* 1.6* 2.4* 2.4*  NEUTROABS 1.0* 0.8* 1.0* 1.1*  --   --   HGB 8.9* 8.7* 8.8* 9.2* 10.5* 9.6*  HCT 27.9* 27.2* 27.5* 27.8* 32.7* 29.9*  MCV 93.9 92.5 90.5 90.3 91.1 91.4  PLT 469* 437* 441* 495* 529* 561*    Medications:    . aspirin EC  81 mg Oral Daily  . atorvastatin  40 mg Oral q1800  .  budesonide (PULMICORT) nebulizer solution  0.5 mg Nebulization BID  . carvedilol  3.125 mg Oral BID  . enoxaparin (LOVENOX) injection  30 mg Subcutaneous Q24H  . furosemide  40 mg Oral BID  . hydrALAZINE  10 mg Oral Q8H  . insulin aspart  0-20 Units Subcutaneous TID WC  . insulin aspart  0-5 Units Subcutaneous QHS  . insulin aspart  10 Units Subcutaneous TID WC  . insulin glargine  28 Units Subcutaneous QHS  . ipratropium-albuterol  3 mL Nebulization BID  . isosorbide mononitrate  30 mg Oral Daily  . magnesium oxide  400 mg Oral BID  . vitamin B-12   1,000 mcg Oral Daily   Elmarie Shiley, MD 10/21/2017, 11:22 AM

## 2017-10-24 ENCOUNTER — Telehealth: Payer: Self-pay

## 2017-10-24 DIAGNOSIS — N183 Chronic kidney disease, stage 3 (moderate): Secondary | ICD-10-CM | POA: Diagnosis not present

## 2017-10-24 DIAGNOSIS — I5021 Acute systolic (congestive) heart failure: Secondary | ICD-10-CM | POA: Diagnosis not present

## 2017-10-24 DIAGNOSIS — I69811 Memory deficit following other cerebrovascular disease: Secondary | ICD-10-CM | POA: Diagnosis not present

## 2017-10-24 DIAGNOSIS — E1122 Type 2 diabetes mellitus with diabetic chronic kidney disease: Secondary | ICD-10-CM | POA: Diagnosis not present

## 2017-10-24 DIAGNOSIS — I428 Other cardiomyopathies: Secondary | ICD-10-CM | POA: Diagnosis not present

## 2017-10-24 DIAGNOSIS — I130 Hypertensive heart and chronic kidney disease with heart failure and stage 1 through stage 4 chronic kidney disease, or unspecified chronic kidney disease: Secondary | ICD-10-CM | POA: Diagnosis not present

## 2017-10-24 NOTE — Telephone Encounter (Signed)
Candace Long from [facility] transferred [place] with [facility] requesting nursing for home care 1 x a week for 1 wk; 2 x a week for 1 week and once a week for every other week. Also [facility] aide 3 x a week for 2 weeks. Also verbal for telemonitoring at home. Pt has hospital f/u appt 10/25/17 3 pm with Dr Darnell Level; pt is going to keep that appt. Pt was D/C from Hospital after pneumonia on 10/21/17. On 10/12/17 at home scale pt weighed 228 lbs; on 10/21/17 pt weighed 255 lbs at the hospital scales; on 10/24/17 on home scales pt weighed 247 lbs. Patty and pts son does not know the admission wt for pt on hospital scales; pts son does not know what pt weighed at home from 10/21/17 until 10/24/17.pts son does not think pts abdomen is a lot more swollen over the last 2-3 days. Patty said belly is distended but legs are not as swollen as usual. Dr Florene Glen decreased hydralazine to 10 mg q8h po and lasix was 40 mg bid except on Mon, Wed, Fri; lasix 40 mg taking 2 tabs in AM and one tab in PM. Now pt was decreased to lasix 40 mg bid.  Lantus was decreased to 20 units at night and D/C metformin while in hospital. Today BS was 156 this afternoon. Patty request cb with order for today's [facility] visit and any change in medication or increase in dosage of hydralazine or lasix. Pt will keep hospital f/u on 10/25/17.

## 2017-10-24 NOTE — Telephone Encounter (Signed)
Candace Long with [facility] advised.

## 2017-10-24 NOTE — Telephone Encounter (Signed)
Please give the order for home care as listed.  I wouldn't change her med at this point since she has f/u tomorrow with PCP.  Thanks.

## 2017-10-25 ENCOUNTER — Telehealth: Payer: Self-pay

## 2017-10-25 ENCOUNTER — Ambulatory Visit (INDEPENDENT_AMBULATORY_CARE_PROVIDER_SITE_OTHER): Payer: Medicare Other | Admitting: Podiatry

## 2017-10-25 ENCOUNTER — Encounter: Payer: Self-pay | Admitting: Podiatry

## 2017-10-25 ENCOUNTER — Telehealth: Payer: Self-pay | Admitting: *Deleted

## 2017-10-25 ENCOUNTER — Ambulatory Visit: Payer: Medicare Other | Admitting: Family Medicine

## 2017-10-25 ENCOUNTER — Telehealth: Payer: Self-pay | Admitting: Family Medicine

## 2017-10-25 DIAGNOSIS — E1122 Type 2 diabetes mellitus with diabetic chronic kidney disease: Secondary | ICD-10-CM | POA: Diagnosis not present

## 2017-10-25 DIAGNOSIS — I5021 Acute systolic (congestive) heart failure: Secondary | ICD-10-CM | POA: Diagnosis not present

## 2017-10-25 DIAGNOSIS — B351 Tinea unguium: Secondary | ICD-10-CM | POA: Diagnosis not present

## 2017-10-25 DIAGNOSIS — E119 Type 2 diabetes mellitus without complications: Secondary | ICD-10-CM | POA: Diagnosis not present

## 2017-10-25 DIAGNOSIS — I69811 Memory deficit following other cerebrovascular disease: Secondary | ICD-10-CM | POA: Diagnosis not present

## 2017-10-25 DIAGNOSIS — I428 Other cardiomyopathies: Secondary | ICD-10-CM | POA: Diagnosis not present

## 2017-10-25 DIAGNOSIS — N183 Chronic kidney disease, stage 3 (moderate): Secondary | ICD-10-CM | POA: Diagnosis not present

## 2017-10-25 DIAGNOSIS — I130 Hypertensive heart and chronic kidney disease with heart failure and stage 1 through stage 4 chronic kidney disease, or unspecified chronic kidney disease: Secondary | ICD-10-CM | POA: Diagnosis not present

## 2017-10-25 NOTE — Telephone Encounter (Signed)
Copied from [place] [id number]. Topic: Quick Communication - See Telephone Encounter >> Oct 25, 2017 12:20 PM Bea Graff, NT wrote: CRM for notification. See Telephone encounter for: Amber from [facility] is calling wanting to get new orders for patient to continue physical therapy. Call back#: [phone]  10/25/17.

## 2017-10-25 NOTE — Telephone Encounter (Signed)
Copied from [place] [phone]. Topic: Quick Communication - Appointment Cancellation >> Oct 25, 2017  1:17 PM Vernona Rieger wrote: Patient called to cancel appointment scheduled for today 11/13 . Patient has not rescheduled their appointment.    Route to department's PEC pool. >> Oct 25, 2017  1:26 PM Helene Shoe, LPN wrote: 02/24/93 at 3 pm was a hospital f/u appt; do you want to chg for late appt cancellation and do you want pt to reschedule. Please advise.

## 2017-10-25 NOTE — Telephone Encounter (Signed)
Copied from [place] [id number]. Topic: Inquiry >> Oct 25, 2017  9:56 AM Conception Chancy, NT wrote: Reason for CRM: [facility]ty with [facility] is needing orders for telemonitoring at home, telemonitoring for weight. Bp. And oxygen saturation, also needing verbal orders for homehealth 3x a week for 2 weeks. Chong Sicilian can be reached at [phone]

## 2017-10-25 NOTE — Progress Notes (Signed)
Patient ID: Candace Long, female   DOB: 07-11-1946, 70 y.o.   MRN: [id number] Complaint:  Visit Type: Patient returns to my office for continued preventative foot care services. Complaint: Patient states" my nails have grown long and thick and become painful to walk and wear shoes" Patient has been diagnosed with DM with no foot  complications. He presents for preventative foot care services. No changes to ROS  Podiatric Exam: Vascular: dorsalis pedis and posterior tibial pulses are palpable bilateral. Capillary return is immediate. Temperature gradient is WNL. Skin turgor WNL  Sensorium: Normal Semmes Weinstein monofilament test. Normal tactile sensation bilaterally. Nail Exam: Pt has thick disfigured discolored nails with subungual debris noted bilateral entire nail hallux through fifth toenails Ulcer Exam: There is no evidence of ulcer or pre-ulcerative changes or infection. Orthopedic Exam: Muscle tone and strength are WNL. No limitations in general ROM. No crepitus or effusions noted. Foot type and digits show no abnormalities. Bony prominences are unremarkable. Skin: No Porokeratosis. No infection or ulcers  Diagnosis:  Tinea unguium, Pain in right toe, pain in left toes  Treatment & Plan Procedures and Treatment: Consent by patient was obtained for treatment procedures. The patient understood the discussion of treatment and procedures well. All questions were answered thoroughly reviewed. Debridement of mycotic and hypertrophic toenails, 1 through 5 bilateral and clearing of subungual debris. No ulceration, no infection noted.  Return Visit-Office Procedure: Patient instructed to return to the office for a follow up visit 3 months for continued evaluation and treatment.  Gardiner Barefoot DPM

## 2017-10-26 DIAGNOSIS — E1165 Type 2 diabetes mellitus with hyperglycemia: Secondary | ICD-10-CM | POA: Diagnosis not present

## 2017-10-26 DIAGNOSIS — E1122 Type 2 diabetes mellitus with diabetic chronic kidney disease: Secondary | ICD-10-CM | POA: Diagnosis not present

## 2017-10-26 DIAGNOSIS — M6281 Muscle weakness (generalized): Secondary | ICD-10-CM | POA: Diagnosis not present

## 2017-10-26 DIAGNOSIS — I472 Ventricular tachycardia: Secondary | ICD-10-CM | POA: Diagnosis not present

## 2017-10-26 DIAGNOSIS — Z0000 Encounter for general adult medical examination without abnormal findings: Secondary | ICD-10-CM | POA: Diagnosis not present

## 2017-10-26 DIAGNOSIS — I69811 Memory deficit following other cerebrovascular disease: Secondary | ICD-10-CM | POA: Diagnosis not present

## 2017-10-26 DIAGNOSIS — I5022 Chronic systolic (congestive) heart failure: Secondary | ICD-10-CM | POA: Diagnosis not present

## 2017-10-26 DIAGNOSIS — D72819 Decreased white blood cell count, unspecified: Secondary | ICD-10-CM | POA: Diagnosis not present

## 2017-10-26 DIAGNOSIS — I5021 Acute systolic (congestive) heart failure: Secondary | ICD-10-CM | POA: Diagnosis not present

## 2017-10-26 DIAGNOSIS — I130 Hypertensive heart and chronic kidney disease with heart failure and stage 1 through stage 4 chronic kidney disease, or unspecified chronic kidney disease: Secondary | ICD-10-CM | POA: Diagnosis not present

## 2017-10-26 DIAGNOSIS — I428 Other cardiomyopathies: Secondary | ICD-10-CM | POA: Diagnosis not present

## 2017-10-26 DIAGNOSIS — N183 Chronic kidney disease, stage 3 (moderate): Secondary | ICD-10-CM | POA: Diagnosis not present

## 2017-10-26 NOTE — Telephone Encounter (Signed)
Spoke with pt's son, Juanda Crumble (on dpr), relaying message per Dr. Oneita Jolly he will have to call back about hospital follow-up appt.

## 2017-10-26 NOTE — Telephone Encounter (Signed)
Plz call - are they planning to f/u here?   Recommend they reschedule hosp f/u visit.  We are receiving multiple requests for home health orders and I need to see her to approve requests.

## 2017-10-27 DIAGNOSIS — I130 Hypertensive heart and chronic kidney disease with heart failure and stage 1 through stage 4 chronic kidney disease, or unspecified chronic kidney disease: Secondary | ICD-10-CM | POA: Diagnosis not present

## 2017-10-27 DIAGNOSIS — E876 Hypokalemia: Secondary | ICD-10-CM | POA: Diagnosis not present

## 2017-10-27 DIAGNOSIS — Z6835 Body mass index (BMI) 35.0-35.9, adult: Secondary | ICD-10-CM | POA: Diagnosis not present

## 2017-10-27 DIAGNOSIS — N183 Chronic kidney disease, stage 3 (moderate): Secondary | ICD-10-CM | POA: Diagnosis not present

## 2017-10-27 DIAGNOSIS — E669 Obesity, unspecified: Secondary | ICD-10-CM | POA: Diagnosis not present

## 2017-10-27 DIAGNOSIS — E1122 Type 2 diabetes mellitus with diabetic chronic kidney disease: Secondary | ICD-10-CM | POA: Diagnosis not present

## 2017-10-27 DIAGNOSIS — Z87891 Personal history of nicotine dependence: Secondary | ICD-10-CM | POA: Diagnosis not present

## 2017-10-27 DIAGNOSIS — D631 Anemia in chronic kidney disease: Secondary | ICD-10-CM | POA: Diagnosis not present

## 2017-10-27 DIAGNOSIS — I69811 Memory deficit following other cerebrovascular disease: Secondary | ICD-10-CM | POA: Diagnosis not present

## 2017-10-27 DIAGNOSIS — Z8701 Personal history of pneumonia (recurrent): Secondary | ICD-10-CM | POA: Diagnosis not present

## 2017-10-27 DIAGNOSIS — E1165 Type 2 diabetes mellitus with hyperglycemia: Secondary | ICD-10-CM | POA: Diagnosis not present

## 2017-10-27 DIAGNOSIS — E785 Hyperlipidemia, unspecified: Secondary | ICD-10-CM | POA: Diagnosis not present

## 2017-10-27 DIAGNOSIS — I5021 Acute systolic (congestive) heart failure: Secondary | ICD-10-CM | POA: Diagnosis not present

## 2017-10-27 DIAGNOSIS — I509 Heart failure, unspecified: Secondary | ICD-10-CM | POA: Diagnosis not present

## 2017-10-27 DIAGNOSIS — Z982 Presence of cerebrospinal fluid drainage device: Secondary | ICD-10-CM | POA: Diagnosis not present

## 2017-10-27 DIAGNOSIS — I428 Other cardiomyopathies: Secondary | ICD-10-CM | POA: Diagnosis not present

## 2017-10-27 NOTE — Telephone Encounter (Signed)
Ok to do however I have not evaluated patient after last 2 hospitalizations and they have not scheduled f/u appt yet.

## 2017-10-27 NOTE — Telephone Encounter (Signed)
Spoke with Patty relaying message per Dr. Darnell Level. Says ok.

## 2017-10-27 NOTE — Telephone Encounter (Signed)
Left message on Amber's vm relaying message per Dr. Darnell Level.

## 2017-10-31 DIAGNOSIS — I5021 Acute systolic (congestive) heart failure: Secondary | ICD-10-CM | POA: Diagnosis not present

## 2017-11-01 ENCOUNTER — Other Ambulatory Visit: Payer: Self-pay | Admitting: Physician Assistant

## 2017-11-01 NOTE — Telephone Encounter (Signed)
REFILL 

## 2017-11-09 ENCOUNTER — Encounter: Payer: Self-pay | Admitting: Cardiology

## 2017-11-16 NOTE — Progress Notes (Signed)
Cardiology Office Note   Date:  11/17/2017   ID:  Candace Long, DOB 1946/06/24, MRN [id number]  PCP:  Ria Bush, MD  Cardiologist:   Minus Breeding, MD    Chief Complaint  Patient presents with  . Shortness of Breath      History of Present Illness: Candace Long is a 71 y.o. female who presents for follow up of volume overload with an EF of 40%.  She was in the hospital in October for acute SOB and was treated for pneumonia.  She had acute on chronic systolic and diastolic HF and had diuresis complicated by acute on chronic renal insufficiency.   She was seen in the office afterward because of some increased edema and SOB.      Since going home she is done relatively well.  Her weight actually went down at home from 226-221 but is been creeping up recently.  It still very far below her peak.  She is watching her fluid although she is been drinking more water recently.  Not having any new palpitations, presyncope or syncope.  She is not having any chest pressure, neck or arm discomfort.  She gets around the house.   Past Medical History:  Diagnosis Date  . Chronic kidney disease, stage III (moderate) ([place]) 05/24/2014  . HLD (hyperlipidemia)   . Hypertension   . Obesity   . S/P cerebral aneurysm repair 1993   some residual memory impairment  . Uncontrolled type 2 diabetes mellitus with nephropathy [facility])    DSME [facility] 06/2014, did not keep f/u classes so discharged    Past Surgical History:  Procedure Laterality Date  . CARDIOVASCULAR STRESS TEST  12/2012   WNL EF 55% ([place])  . COLONOSCOPY  07/2014   1 tubular adenoma, severe diverticulosis, rpt 5 yrs [facility])  . CRANIOTOMY  1993   cerebral aneurysm repair  . RIGHT/LEFT HEART CATH AND CORONARY ANGIOGRAPHY N/A 08/22/2017   Procedure: RIGHT/LEFT HEART CATH AND CORONARY ANGIOGRAPHY;  Surgeon: Lorretta Harp, MD;  Location: [facility];  Service: Cardiovascular;  Laterality: N/A;  . VENTRICULOPERITONEAL  SHUNT  1993     Current Outpatient Medications  Medication Sig Dispense Refill  . albuterol (VENTOLIN HFA) 108 (90 Base) MCG/ACT inhaler Inhale 2 puffs into the lungs every 6 (six) hours as needed for wheezing or shortness of breath. 1 Inhaler 3  . amLODipine (NORVASC) 10 MG tablet TAKE 1 TABLET (10 MG TOTAL) BY MOUTH DAILY. 90 tablet 1  . aspirin EC 81 MG tablet Take 81 mg by mouth daily.     Marland Kitchen atorvastatin (LIPITOR) 40 MG tablet TAKE 1 TABLET (40 MG TOTAL) BY MOUTH DAILY. 90 tablet 2  . B-D ULTRAFINE III SHORT PEN 31G X 8 MM MISC USE TO INJECT LANTUS ONCE DAILY. 100 each 3  . carvedilol (COREG) 3.125 MG tablet Take 1 tablet (3.125 mg total) by mouth 2 (two) times daily. 180 tablet 3  . Cholecalciferol (VITAMIN D) 1000 UNITS capsule Take 1 capsule (1,000 Units total) by mouth daily.    . furosemide (LASIX) 40 MG tablet TAKE 1 TABLET BY MOUTH TWICE A DAY 60 tablet 1  . hydrALAZINE (APRESOLINE) 10 MG tablet Take 1 tablet (10 mg total) every 8 (eight) hours by mouth. 90 tablet 0  . Insulin Glargine (LANTUS SOLOSTAR) 100 UNIT/ML Solostar Pen Inject 20 Units daily at 10 pm into the skin. 15 pen 3  . isosorbide mononitrate (IMDUR) 30 MG 24 hr tablet TAKE 1  TABLET BY MOUTH EVERY DAY 30 tablet 1  . KLOR-CON M20 20 MEQ tablet TAKE 2 TABLETS (40 MEQ TOTAL) BY MOUTH 2 (TWO) TIMES DAILY. 120 tablet 1  . magnesium oxide (MAG-OX) 400 (241.3 Mg) MG tablet TAKE 1 TABLET BY MOUTH TWICE A DAY 60 tablet 5  . vitamin B-12 (CYANOCOBALAMIN) 1000 MCG tablet Take 1 tablet (1,000 mcg total) by mouth daily.     No current facility-administered medications for this visit.     Allergies:   Cefepime and Hydrochlorothiazide    ROS:  Please see the history of present illness.   Otherwise, review of systems are positive for none.   All other systems are reviewed and negative.    PHYSICAL EXAM: VS:  BP 137/77   Pulse 67   Ht 5\' 9"  (1.753 m)   Wt 229 lb (103.9 kg)   BMI 33.82 kg/m  , BMI Body mass index is 33.82  kg/m. GENERAL:  Well appearing NECK:  No jugular venous distention, waveform within normal limits, carotid upstroke brisk and symmetric, no bruits, no thyromegaly LUNGS:  Clear to auscultation bilaterally BACK:  No CVA tenderness CHEST:  Unremarkable HEART:  PMI not displaced or sustained,S1 and S2 within normal limits, no S3, no S4, no clicks, no rubs, no murmurs ABD:  Flat, positive bowel sounds normal in frequency in pitch, no bruits, no rebound, no guarding, no midline pulsatile mass, no hepatomegaly, no splenomegaly EXT:  2 plus pulses throughout, no edema, no cyanosis no clubbing   EKG:  EKG is not ordered today.  Recent Labs: 08/17/2017: Pro B Natriuretic peptide (BNP) 541.0; TSH 2.25 10/13/2017: ALT 18 10/15/2017: B Natriuretic Peptide 309.8 10/19/2017: Magnesium 2.8 10/21/2017: BUN 93; Creatinine, Ser 2.14; Hemoglobin 9.6; Platelets 561; Potassium 3.5; Sodium 143    Lipid Panel    Component Value Date/Time   CHOL 118 08/20/2017 0229   TRIG 74 08/20/2017 0229   TRIG 157 06/29/2013   TRIG 157 06/29/2013   HDL 36 (L) 08/20/2017 0229   CHOLHDL 3.3 08/20/2017 0229   VLDL 15 08/20/2017 0229   LDLCALC 67 08/20/2017 0229   LDLCALC 62 06/29/2013   LDLCALC 62 06/29/2013      Wt Readings from Last 3 Encounters:  11/17/17 229 lb (103.9 kg)  10/21/17 237 lb 8 oz (107.7 kg)  10/10/17 231 lb (104.8 kg)      Other studies Reviewed: Additional studies/ records that were reviewed today include: Hospital records. Review of the above records demonstrates:  Please see elsewhere in the note.     ASSESSMENT AND PLAN:   CHRONIC COMBINED SYSTOLIC AND DIASTOLIC HF: Her weights been going up a pound a day for the last 5 days.  I am going to give her 2 days of increased 40 mg of Lasix.  She has been drinking more fluids so we talked about fluid restriction.  Her to get a basic metabolic profile today.  HTN:  The blood pressure is at target. No change in medications is indicated. We will  continue with therapeutic lifestyle changes (TLC).  CKD IIIb:    Last creat was 2.14.  This was down from the peak that was greater than 3.  This was in early Nov.  She will be checked again today.    Current medicines are reviewed at length with the patient today.  The patient does not have concerns regarding medicines.  The following changes have been made:  As above.   Labs/ tests ordered today include:  Orders Placed This Encounter  Procedures  . Basic Metabolic Panel (BMET)     Disposition:   FU with APP in 4 months.      Signed, Minus Breeding, MD  11/17/2017 4:21 PM    [facility]

## 2017-11-17 ENCOUNTER — Ambulatory Visit (INDEPENDENT_AMBULATORY_CARE_PROVIDER_SITE_OTHER): Payer: Medicare Other | Admitting: Cardiology

## 2017-11-17 ENCOUNTER — Encounter: Payer: Self-pay | Admitting: Cardiology

## 2017-11-17 VITALS — BP 137/77 | HR 67 | Ht 69.0 in | Wt 229.0 lb

## 2017-11-17 DIAGNOSIS — N183 Chronic kidney disease, stage 3 unspecified: Secondary | ICD-10-CM

## 2017-11-17 DIAGNOSIS — Z79899 Other long term (current) drug therapy: Secondary | ICD-10-CM

## 2017-11-17 DIAGNOSIS — I5032 Chronic diastolic (congestive) heart failure: Secondary | ICD-10-CM | POA: Diagnosis not present

## 2017-11-17 NOTE — Patient Instructions (Addendum)
Medication Instructions:  Take an extra 40 mg of Furosemide for 2 days  If you need a refill on your cardiac medications before your next appointment, please call your pharmacy.  Labwork: BMP Today HERE IN OUR OFFICE AT [facility]  Take the provided lab slips for you to take with you to the lab for you blood draw.   You will NOT need to fast   You may go to any [facility] lab that is convenient for you however, we do have a lab in our office that is able to assist you. You do NOT need an appointment for our lab. Once in our office lobby there is a podium to the right of the check-in desk where you are to sign-in and ring a doorbell to alert [place] you are here. Lab is open Monday-Friday from 8:00am to 4:00pm; and is closed for lunch from 12:45p-1:45pm   Testing/Procedures: None Ordered  Follow-Up: Your physician wants you to follow-up in: 4 Months with Rosaria Ferries.    Thank you for choosing [facility] at [facility]!!

## 2017-11-18 LAB — BASIC METABOLIC PANEL
BUN/Creatinine Ratio: 17 (ref 12–28)
BUN: 29 mg/dL — ABNORMAL HIGH (ref 8–27)
CALCIUM: 10.5 mg/dL — AB (ref 8.7–10.3)
CHLORIDE: 98 mmol/L (ref 96–106)
CO2: 29 mmol/L (ref 20–29)
Creatinine, Ser: 1.73 mg/dL — ABNORMAL HIGH (ref 0.57–1.00)
GFR calc Af Amer: 34 mL/min/{1.73_m2} — ABNORMAL LOW (ref >59)
GFR calc non Af Amer: 30 mL/min/{1.73_m2} — ABNORMAL LOW (ref >59)
GLUCOSE: 72 mg/dL (ref 65–99)
POTASSIUM: 4.3 mmol/L (ref 3.5–5.2)
SODIUM: 143 mmol/L (ref 134–144)

## 2017-11-19 ENCOUNTER — Other Ambulatory Visit: Payer: Self-pay | Admitting: Family Medicine

## 2017-11-21 ENCOUNTER — Telehealth: Payer: Self-pay | Admitting: *Deleted

## 2017-11-21 NOTE — Telephone Encounter (Signed)
Left message on patient's voicemail letting her know our office will be closed due to inclement weather.  Provided our number to call back to reschedule.  Since she will be a new patient, she can be scheduled with the first available, appropriate provider.

## 2017-11-22 ENCOUNTER — Ambulatory Visit: Payer: Medicare Other | Admitting: Neurology

## 2017-11-22 DIAGNOSIS — I300 Acute nonspecific idiopathic pericarditis: Secondary | ICD-10-CM | POA: Diagnosis not present

## 2017-11-23 DIAGNOSIS — N183 Chronic kidney disease, stage 3 (moderate): Secondary | ICD-10-CM | POA: Diagnosis not present

## 2017-11-23 DIAGNOSIS — D72819 Decreased white blood cell count, unspecified: Secondary | ICD-10-CM | POA: Diagnosis not present

## 2017-11-23 DIAGNOSIS — E1165 Type 2 diabetes mellitus with hyperglycemia: Secondary | ICD-10-CM | POA: Diagnosis not present

## 2017-11-23 DIAGNOSIS — E119 Type 2 diabetes mellitus without complications: Secondary | ICD-10-CM | POA: Diagnosis not present

## 2017-11-23 DIAGNOSIS — F0390 Unspecified dementia without behavioral disturbance: Secondary | ICD-10-CM | POA: Diagnosis not present

## 2017-11-23 DIAGNOSIS — I5022 Chronic systolic (congestive) heart failure: Secondary | ICD-10-CM | POA: Diagnosis not present

## 2017-11-23 DIAGNOSIS — D649 Anemia, unspecified: Secondary | ICD-10-CM | POA: Diagnosis not present

## 2017-11-28 ENCOUNTER — Other Ambulatory Visit: Payer: Self-pay | Admitting: Cardiology

## 2017-11-29 ENCOUNTER — Encounter: Payer: Self-pay | Admitting: Diagnostic Neuroimaging

## 2017-11-29 ENCOUNTER — Ambulatory Visit (INDEPENDENT_AMBULATORY_CARE_PROVIDER_SITE_OTHER): Payer: Medicare Other | Admitting: Diagnostic Neuroimaging

## 2017-11-29 VITALS — BP 117/74 | HR 62 | Ht 69.0 in | Wt 234.0 lb

## 2017-11-29 DIAGNOSIS — F0391 Unspecified dementia with behavioral disturbance: Secondary | ICD-10-CM

## 2017-11-29 DIAGNOSIS — F03B18 Unspecified dementia, moderate, with other behavioral disturbance: Secondary | ICD-10-CM

## 2017-11-29 DIAGNOSIS — I509 Heart failure, unspecified: Secondary | ICD-10-CM | POA: Diagnosis not present

## 2017-11-29 NOTE — Progress Notes (Signed)
[facility]  PATIENT: Candace Long DOB: 1946/06/28  REFERRING CLINICIAN: Biagio Borg HISTORY FROM: patient and daughter-in-law REASON FOR VISIT: new consult    HISTORICAL  CHIEF COMPLAINT:  Chief Complaint  Patient presents with  . Memory Loss    rm 7, New Pt, dgtr- Angie MMSE 20  . MCI    HISTORY OF PRESENT ILLNESS:   71 year old female here for evaluation of memory loss.  Patient has history of ruptured cerebral aneurysm in 1993, status post aneurysm clipping surgery and shunt placement.  She had significant cognitive deficits at that time.  Fortunately she was able to recover and have a good quality of life since that time.  For the past 7 years patient has been living with her son, daughter-in-law and their family with a number of grandchildren as well.  They have noticed some mild memory problems over the last few years.  However in the last 3 months patient has had some significant memory lapses.  She has extremely limited short-term memory ability.  She forgets things quickly within 5-15 minutes.  She has had several episodes of confusing family members, forgetting her daughter-in-law, and other grandchildren, all of whom live at home with her.  Patient also having some short-term and mood outbursts lately.  Family has been helping with many of her day-to-day activities.  They have been accepted into the [facility] program to start next month which will relieve some of the caregiver stress.   REVIEW OF SYSTEMS: Full 14 system review of systems performed and negative with exception of: Memory loss confusion incontinence weight gain.  ALLERGIES: Allergies  Allergen Reactions  . Cefepime Hives  . Hydrochlorothiazide Other (See Comments)    Hypokalemia, hyponatremia    HOME MEDICATIONS: Outpatient Medications Prior to Visit  Medication Sig Dispense Refill  . albuterol (VENTOLIN HFA) 108 (90 Base) MCG/ACT inhaler Inhale 2 puffs into the lungs every 6 (six)  hours as needed for wheezing or shortness of breath. 1 Inhaler 3  . amLODipine (NORVASC) 10 MG tablet TAKE 1 TABLET (10 MG TOTAL) BY MOUTH DAILY. 90 tablet 0  . aspirin EC 81 MG tablet Take 81 mg by mouth daily.     Marland Kitchen atorvastatin (LIPITOR) 40 MG tablet TAKE 1 TABLET (40 MG TOTAL) BY MOUTH DAILY. 90 tablet 2  . B-D ULTRAFINE III SHORT PEN 31G X 8 MM MISC USE TO INJECT LANTUS ONCE DAILY. 100 each 3  . carvedilol (COREG) 3.125 MG tablet Take 1 tablet (3.125 mg total) by mouth 2 (two) times daily. 180 tablet 3  . Cholecalciferol (VITAMIN D) 1000 UNITS capsule Take 1 capsule (1,000 Units total) by mouth daily.    . furosemide (LASIX) 40 MG tablet TAKE 1 TABLET BY MOUTH TWICE A DAY 60 tablet 9  . Insulin Glargine (LANTUS SOLOSTAR) 100 UNIT/ML Solostar Pen Inject 20 Units daily at 10 pm into the skin. 15 pen 3  . isosorbide mononitrate (IMDUR) 30 MG 24 hr tablet TAKE 1 TABLET BY MOUTH EVERY DAY 30 tablet 9  . KLOR-CON M20 20 MEQ tablet TAKE 2 TABLETS (40 MEQ TOTAL) BY MOUTH 2 (TWO) TIMES DAILY. 120 tablet 3  . magnesium oxide (MAG-OX) 400 (241.3 Mg) MG tablet TAKE 1 TABLET BY MOUTH TWICE A DAY 60 tablet 5  . vitamin B-12 (CYANOCOBALAMIN) 1000 MCG tablet Take 1 tablet (1,000 mcg total) by mouth daily.    . hydrALAZINE (APRESOLINE) 10 MG tablet Take 1 tablet (10 mg total) every 8 (eight) hours by mouth.  90 tablet 0   No facility-administered medications prior to visit.     PAST MEDICAL HISTORY: Past Medical History:  Diagnosis Date  . Chronic kidney disease, stage III (moderate) ([place]) 05/24/2014  . Heart failure (HCC)    CHF  . HLD (hyperlipidemia)   . Hypertension   . Obesity   . S/P cerebral aneurysm repair 1993   some residual memory impairment  . Uncontrolled type 2 diabetes mellitus with nephropathy [facility])    DSME [facility] 06/2014, did not keep f/u classes so discharged    PAST SURGICAL HISTORY: Past Surgical History:  Procedure Laterality Date  . CARDIOVASCULAR STRESS TEST  12/2012   WNL  EF 55% ([place])  . COLONOSCOPY  07/2014   1 tubular adenoma, severe diverticulosis, rpt 5 yrs [facility])  . CRANIOTOMY  1993   cerebral aneurysm repair  . RIGHT/LEFT HEART CATH AND CORONARY ANGIOGRAPHY N/A 08/22/2017   Procedure: RIGHT/LEFT HEART CATH AND CORONARY ANGIOGRAPHY;  Surgeon: Lorretta Harp, MD;  Location: [facility];  Service: Cardiovascular;  Laterality: N/A;  . VENTRICULOPERITONEAL SHUNT  1993    FAMILY HISTORY: Family History  Problem Relation Age of Onset  . Hypertension Mother   . Diabetes Mother   . Dementia Mother   . Diabetes Sister   . Heart failure Sister   . CAD Neg Hx   . Stroke Neg Hx   . Cancer Neg Hx   . Colon cancer Neg Hx   . Esophageal cancer Neg Hx   . Rectal cancer Neg Hx   . Stomach cancer Neg Hx     SOCIAL HISTORY:  Social History   Socioeconomic History  . Marital status: Divorced    Spouse name: Not on file  . Number of children: 2  . Years of education: 20  . Highest education level: Not on file  Social Needs  . Financial resource strain: Not on file  . Food insecurity - worry: Not on file  . Food insecurity - inability: Not on file  . Transportation needs - medical: Not on file  . Transportation needs - non-medical: Not on file  Occupational History    Comment: retired  Tobacco Use  . Smoking status: Former Smoker    Packs/day: 1.00    Types: Cigarettes    Last attempt to quit: 12/13/2008    Years since quitting: 8.9  . Smokeless tobacco: Former Systems developer    Types: Snuff    Quit date: 12/13/2010  Substance and Sexual Activity  . Alcohol use: No  . Drug use: No  . Sexual activity: Not on file  Other Topics Concern  . Not on file  Social History Narrative   Lives with son and his wife and 1 granddaughter   Occupation: retired, Engineer, manufacturing systems   Disability since 1993 after aneurysm   Edu: HS   Activity: no regular exercise   Diet: good water, some fruits, vegetables daily   Caffeine- 1 cup daily   Advanced directives:  Son is HCPOA     PHYSICAL EXAM  GENERAL EXAM/CONSTITUTIONAL: Vitals:  Vitals:   11/29/17 1446  BP: 117/74  Pulse: 62  Weight: 234 lb (106.1 kg)  Height: 5\' 9"  (1.753 m)     Body mass index is 34.56 kg/m.  Visual Acuity Screening   Right eye Left eye Both eyes  Without correction: 20/40 20/30   With correction:        Patient is in no distress; well developed, nourished and groomed; neck is  supple  CARDIOVASCULAR:  Examination of carotid arteries is normal; no carotid bruits  Regular rate and rhythm, no murmurs  Examination of peripheral vascular system by observation and palpation is normal  EYES:  Ophthalmoscopic exam of optic discs and posterior segments is normal; no papilledema or hemorrhages  MUSCULOSKELETAL:  Gait, strength, tone, movements noted in Neurologic exam below  NEUROLOGIC: MENTAL STATUS:  MMSE - Mini Mental State Exam 11/29/2017  Orientation to time 5  Orientation to Place 2  Registration 3  Attention/ Calculation 1  Recall 0  Language- name 2 objects 2  Language- repeat 1  Language- follow 3 step command 3  Language- read & follow direction 1  Write a sentence 1  Copy design 1  Total score 20    awake, alert, oriented to person, time; NOT PLACE  Rulo attention and concentration  language fluent, comprehension intact, naming intact,   fund of knowledge appropriate  CRANIAL NERVE:   2nd - no papilledema on fundoscopic exam  2nd, 3rd, 4th, 6th - pupils equal and reactive to light, visual fields full to confrontation, extraocular muscles intact, no nystagmus  5th - facial sensation symmetric  7th - facial strength symmetric  8th - hearing intact  9th - palate elevates symmetrically, uvula midline  11th - shoulder shrug symmetric  12th - tongue protrusion midline  MOTOR:   normal bulk and tone, full strength in the BUE, BLE  SENSORY:   normal and symmetric to light touch, temperature,  vibration  COORDINATION:   finger-nose-finger, fine finger movements normal  REFLEXES:   deep tendon reflexes TRACE and symmetric  GAIT/STATION:   narrow based gait; ANTALGIC, STOOPED POSTURE    DIAGNOSTIC DATA (LABS, IMAGING, TESTING) - I reviewed patient records, labs, notes, testing and imaging myself where available.  Lab Results  Component Value Date   WBC 2.4 (L) 10/21/2017   HGB 9.6 (L) 10/21/2017   HCT 29.9 (L) 10/21/2017   MCV 91.4 10/21/2017   PLT 561 (H) 10/21/2017      Component Value Date/Time   NA 143 11/17/2017 1602   K 4.3 11/17/2017 1602   K 5.0 06/29/2013   K 5.0 06/29/2013   CL 98 11/17/2017 1602   CO2 29 11/17/2017 1602   GLUCOSE 72 11/17/2017 1602   GLUCOSE 46 (L) 10/21/2017 0550   BUN 29 (H) 11/17/2017 1602   CREATININE 1.73 (H) 11/17/2017 1602   CREATININE 1.00 (H) 04/04/2016 1127   CALCIUM 10.5 (H) 11/17/2017 1602   PROT 7.3 10/13/2017 2110   ALBUMIN 3.1 (L) 10/19/2017 0601   AST 24 10/13/2017 2110   AST 12 06/29/2013   AST 12 06/29/2013   ALT 18 10/13/2017 2110   ALKPHOS 114 10/13/2017 2110   ALKPHOS 110 06/29/2013   ALKPHOS 110 06/29/2013   BILITOT 0.9 10/13/2017 2110   BILITOT 0.5 06/29/2013   BILITOT 0.5 06/29/2013   GFRNONAA 30 (L) 11/17/2017 1602   GFRAA 34 (L) 11/17/2017 1602   Lab Results  Component Value Date   CHOL 118 08/20/2017   HDL 36 (L) 08/20/2017   LDLCALC 67 08/20/2017   TRIG 74 08/20/2017   CHOLHDL 3.3 08/20/2017   Lab Results  Component Value Date   HGBA1C 6.3 (H) 10/14/2017   Lab Results  Component Value Date   YQMVHQIO96 295 05/31/2016   Lab Results  Component Value Date   TSH 2.25 08/17/2017     03/08/11 CT head [I reviewed images myself and agree with interpretation. -[facility]P]  -  Postsurgical changes from aneurysm clipping.  No acute hemorrhage or hydrocephalus.    ASSESSMENT AND PLAN  71 y.o. year old female here with gradual onset, progressive short-term memory loss, confusion,  disorientation to place, time, family members, affecting her day-to-day life, consistent with neurodegenerative dementia.   Dx: mild-moderate dementia  1. Moderate dementia with behavioral disturbance       PLAN: - brain healthy activities reviewed - dementia and caregiver resources reviewed - consider donepezil + memantine in future; family would like to hold off for now - MIND diet reviewed  Return in about 6 months (around 05/30/2018).    Penni Bombard, MD 24/08/7352, 2:99 PM Certified in Neurology, Neurophysiology and Neuroimaging  [facility] [address], [place], [place] [address] [phone]

## 2017-11-29 NOTE — Patient Instructions (Signed)
Thank you for coming to see [place] at [facility]. I hope we have been able to provide you high quality care today.  You may receive a patient satisfaction survey over the next few weeks. We would appreciate your feedback and comments so that we may continue to improve ourselves and the health of our patients.  - brain healthy activities reviewed  - dementia and caregiver resources reviewed --> CapitalMile.co.nz  - consider donepezil + memantine in future; family would like to hold off for now  - MIND diet reviewed    ~~~~~~~~~~~~~~~~~~~~~~~~~~~~~~~~~~~~~~~~~~~~~~~~~~~~~~~~~~~~~~~~~  DR. Tashi Andujo'S GUIDE TO HAPPY AND HEALTHY LIVING These are some of my general health and wellness recommendations. Some of them may apply to you better than others. Please use common sense as you try these suggestions and feel free to ask me any questions.   ACTIVITY/FITNESS Mental, social, emotional and physical stimulation are very important for brain and body health. Try learning a new activity (arts, music, language, sports, games).  Keep moving your body to the best of your abilities. You can do this at home, inside or outside, the park, community center, gym or anywhere you like. Consider a physical therapist or personal trainer to get started. Consider the app Sworkit. Fitness trackers such as smart-watches, smart-phones or Fitbits can help as well.   NUTRITION Eat more plants: colorful vegetables, nuts, seeds and berries.  Eat less sugar, salt, preservatives and processed foods.  Avoid toxins such as cigarettes and alcohol.  Drink water when you are thirsty. Warm water with a slice of lemon is an excellent morning drink to start the day.  Consider these websites for more information The Nutrition Source (https://www.henry-hernandez.biz/) Precision Nutrition (WindowBlog.ch)   RELAXATION Consider practicing mindfulness meditation or other  relaxation techniques such as deep breathing, prayer, yoga, tai chi, massage. See website mindful.org or the apps Headspace or Calm to help get started.   SLEEP Try to get at least 7-8+ hours sleep per day. Regular exercise and reduced caffeine will help you sleep better. Practice good sleep hygeine techniques. See website sleep.org for more information.   PLANNING Prepare estate planning, living will, healthcare POA documents. Sometimes this is best planned with the help of an attorney. Theconversationproject.org and agingwithdignity.org are excellent resources.

## 2017-12-29 ENCOUNTER — Telehealth: Payer: Self-pay | Admitting: Cardiology

## 2017-12-29 NOTE — Telephone Encounter (Signed)
New Message    [facility] called patient told them you restricted her fluid intake by 48oz and they need to verify this information. They received your office notes, but they need clarification from what the patient is telling them.

## 2017-12-29 NOTE — Telephone Encounter (Signed)
Returned call to [employer] [facility] she is just inquiring about the fluid restriction discussed at the last appointment. Informed will find out and call back tomorrow. I do see where Dr Percival Spanish per 11-17-17 note: CHRONIC COMBINED SYSTOLIC AND DIASTOLIC HF: Her weights been going up a pound a day for the last 5 days.  I am going to give her 2 days of increased 40 mg of Lasix.  She has been drinking more fluids so we talked about fluid restriction.  Her to get a basic metabolic profile today.  It does not say what fluid restriction is. Please advise

## 2017-12-30 NOTE — Telephone Encounter (Signed)
Spoke with Deanna from [place] about Dr Percival Spanish recommendation.

## 2017-12-30 NOTE — Telephone Encounter (Signed)
1500 cc or 48 oz.

## 2018-01-24 ENCOUNTER — Ambulatory Visit: Payer: Medicare Other | Admitting: Podiatry

## 2018-01-26 ENCOUNTER — Encounter (HOSPITAL_BASED_OUTPATIENT_CLINIC_OR_DEPARTMENT_OTHER): Payer: Self-pay

## 2018-01-26 ENCOUNTER — Other Ambulatory Visit: Payer: Self-pay

## 2018-01-26 ENCOUNTER — Inpatient Hospital Stay (HOSPITAL_BASED_OUTPATIENT_CLINIC_OR_DEPARTMENT_OTHER)
Admission: EM | Admit: 2018-01-26 | Discharge: 2018-02-10 | DRG: 871 | Disposition: E | Payer: No Typology Code available for payment source | Attending: Internal Medicine | Admitting: Internal Medicine

## 2018-01-26 ENCOUNTER — Other Ambulatory Visit: Payer: Self-pay | Admitting: Family Medicine

## 2018-01-26 ENCOUNTER — Emergency Department (HOSPITAL_BASED_OUTPATIENT_CLINIC_OR_DEPARTMENT_OTHER): Payer: No Typology Code available for payment source

## 2018-01-26 DIAGNOSIS — N183 Chronic kidney disease, stage 3 (moderate): Secondary | ICD-10-CM | POA: Diagnosis present

## 2018-01-26 DIAGNOSIS — Z794 Long term (current) use of insulin: Secondary | ICD-10-CM

## 2018-01-26 DIAGNOSIS — Z833 Family history of diabetes mellitus: Secondary | ICD-10-CM

## 2018-01-26 DIAGNOSIS — I428 Other cardiomyopathies: Secondary | ICD-10-CM | POA: Diagnosis present

## 2018-01-26 DIAGNOSIS — J9601 Acute respiratory failure with hypoxia: Secondary | ICD-10-CM | POA: Diagnosis present

## 2018-01-26 DIAGNOSIS — R05 Cough: Secondary | ICD-10-CM

## 2018-01-26 DIAGNOSIS — R748 Abnormal levels of other serum enzymes: Secondary | ICD-10-CM | POA: Diagnosis present

## 2018-01-26 DIAGNOSIS — Z87891 Personal history of nicotine dependence: Secondary | ICD-10-CM | POA: Diagnosis not present

## 2018-01-26 DIAGNOSIS — I469 Cardiac arrest, cause unspecified: Secondary | ICD-10-CM | POA: Diagnosis not present

## 2018-01-26 DIAGNOSIS — Z79899 Other long term (current) drug therapy: Secondary | ICD-10-CM

## 2018-01-26 DIAGNOSIS — Z6833 Body mass index (BMI) 33.0-33.9, adult: Secondary | ICD-10-CM

## 2018-01-26 DIAGNOSIS — E669 Obesity, unspecified: Secondary | ICD-10-CM | POA: Diagnosis present

## 2018-01-26 DIAGNOSIS — E1121 Type 2 diabetes mellitus with diabetic nephropathy: Secondary | ICD-10-CM | POA: Diagnosis present

## 2018-01-26 DIAGNOSIS — E785 Hyperlipidemia, unspecified: Secondary | ICD-10-CM | POA: Diagnosis present

## 2018-01-26 DIAGNOSIS — N179 Acute kidney failure, unspecified: Secondary | ICD-10-CM | POA: Diagnosis not present

## 2018-01-26 DIAGNOSIS — N189 Chronic kidney disease, unspecified: Secondary | ICD-10-CM | POA: Diagnosis not present

## 2018-01-26 DIAGNOSIS — R059 Cough, unspecified: Secondary | ICD-10-CM

## 2018-01-26 DIAGNOSIS — I5030 Unspecified diastolic (congestive) heart failure: Secondary | ICD-10-CM

## 2018-01-26 DIAGNOSIS — D75839 Thrombocytosis, unspecified: Secondary | ICD-10-CM | POA: Diagnosis present

## 2018-01-26 DIAGNOSIS — Z7982 Long term (current) use of aspirin: Secondary | ICD-10-CM

## 2018-01-26 DIAGNOSIS — D473 Essential (hemorrhagic) thrombocythemia: Secondary | ICD-10-CM | POA: Diagnosis not present

## 2018-01-26 DIAGNOSIS — I959 Hypotension, unspecified: Secondary | ICD-10-CM | POA: Diagnosis present

## 2018-01-26 DIAGNOSIS — J189 Pneumonia, unspecified organism: Secondary | ICD-10-CM | POA: Diagnosis present

## 2018-01-26 DIAGNOSIS — D72819 Decreased white blood cell count, unspecified: Secondary | ICD-10-CM | POA: Diagnosis present

## 2018-01-26 DIAGNOSIS — E1122 Type 2 diabetes mellitus with diabetic chronic kidney disease: Secondary | ICD-10-CM | POA: Diagnosis present

## 2018-01-26 DIAGNOSIS — Z888 Allergy status to other drugs, medicaments and biological substances status: Secondary | ICD-10-CM | POA: Diagnosis not present

## 2018-01-26 DIAGNOSIS — E538 Deficiency of other specified B group vitamins: Secondary | ICD-10-CM | POA: Diagnosis present

## 2018-01-26 DIAGNOSIS — D631 Anemia in chronic kidney disease: Secondary | ICD-10-CM | POA: Diagnosis present

## 2018-01-26 DIAGNOSIS — I5042 Chronic combined systolic (congestive) and diastolic (congestive) heart failure: Secondary | ICD-10-CM | POA: Diagnosis present

## 2018-01-26 DIAGNOSIS — Z8249 Family history of ischemic heart disease and other diseases of the circulatory system: Secondary | ICD-10-CM | POA: Diagnosis not present

## 2018-01-26 DIAGNOSIS — F0390 Unspecified dementia without behavioral disturbance: Secondary | ICD-10-CM | POA: Diagnosis present

## 2018-01-26 DIAGNOSIS — A419 Sepsis, unspecified organism: Secondary | ICD-10-CM | POA: Diagnosis present

## 2018-01-26 DIAGNOSIS — I130 Hypertensive heart and chronic kidney disease with heart failure and stage 1 through stage 4 chronic kidney disease, or unspecified chronic kidney disease: Secondary | ICD-10-CM | POA: Diagnosis present

## 2018-01-26 DIAGNOSIS — Z8701 Personal history of pneumonia (recurrent): Secondary | ICD-10-CM | POA: Diagnosis not present

## 2018-01-26 DIAGNOSIS — Z139 Encounter for screening, unspecified: Secondary | ICD-10-CM

## 2018-01-26 DIAGNOSIS — E559 Vitamin D deficiency, unspecified: Secondary | ICD-10-CM | POA: Diagnosis present

## 2018-01-26 HISTORY — DX: Heart failure, unspecified: I50.9

## 2018-01-26 LAB — COMPREHENSIVE METABOLIC PANEL
ALBUMIN: 3.4 g/dL — AB (ref 3.5–5.0)
ALK PHOS: 91 U/L (ref 38–126)
ALT: 16 U/L (ref 14–54)
AST: 38 U/L (ref 15–41)
Anion gap: 15 (ref 5–15)
BILIRUBIN TOTAL: 1.1 mg/dL (ref 0.3–1.2)
BUN: 34 mg/dL — ABNORMAL HIGH (ref 6–20)
CALCIUM: 9 mg/dL (ref 8.9–10.3)
CO2: 19 mmol/L — ABNORMAL LOW (ref 22–32)
CREATININE: 3.09 mg/dL — AB (ref 0.44–1.00)
Chloride: 97 mmol/L — ABNORMAL LOW (ref 101–111)
GFR calc Af Amer: 17 mL/min — ABNORMAL LOW (ref >60)
GFR calc non Af Amer: 14 mL/min — ABNORMAL LOW (ref >60)
GLUCOSE: 205 mg/dL — AB (ref 65–99)
Potassium: 4.1 mmol/L (ref 3.5–5.1)
Sodium: 131 mmol/L — ABNORMAL LOW (ref 135–145)
Total Protein: 7.7 g/dL (ref 6.5–8.1)

## 2018-01-26 LAB — I-STAT CG4 LACTIC ACID, ED
Lactic Acid, Venous: 2.92 mmol/L (ref 0.5–1.9)
Lactic Acid, Venous: 4.13 mmol/L (ref 0.5–1.9)

## 2018-01-26 LAB — CBC WITH DIFFERENTIAL/PLATELET
BASOS PCT: 1 %
Basophils Absolute: 0 10*3/uL (ref 0.0–0.1)
EOS ABS: 0 10*3/uL (ref 0.0–0.7)
Eosinophils Relative: 1 %
HCT: 27.2 % — ABNORMAL LOW (ref 36.0–46.0)
HEMOGLOBIN: 8.9 g/dL — AB (ref 12.0–15.0)
LYMPHS ABS: 1.1 10*3/uL (ref 0.7–4.0)
Lymphocytes Relative: 36 %
MCH: 31.1 pg (ref 26.0–34.0)
MCHC: 32.7 g/dL (ref 30.0–36.0)
MCV: 95.1 fL (ref 78.0–100.0)
MONO ABS: 1.8 10*3/uL — AB (ref 0.1–1.0)
Monocytes Relative: 55 %
NEUTROS ABS: 0.2 10*3/uL — AB (ref 1.7–7.7)
Neutrophils Relative %: 7 %
PLATELETS: 430 10*3/uL — AB (ref 150–400)
RBC: 2.86 MIL/uL — ABNORMAL LOW (ref 3.87–5.11)
RDW: 13.6 % (ref 11.5–15.5)
WBC: 3.1 10*3/uL — ABNORMAL LOW (ref 4.0–10.5)

## 2018-01-26 LAB — CBG MONITORING, ED: Glucose-Capillary: 201 mg/dL — ABNORMAL HIGH (ref 65–99)

## 2018-01-26 LAB — TROPONIN I: TROPONIN I: 0.07 ng/mL — AB (ref <0.03)

## 2018-01-26 LAB — BRAIN NATRIURETIC PEPTIDE: B NATRIURETIC PEPTIDE 5: 243.3 pg/mL — AB (ref 0.0–100.0)

## 2018-01-26 MED ORDER — ACETAMINOPHEN 325 MG PO TABS
650.0000 mg | ORAL_TABLET | Freq: Once | ORAL | Status: AC
  Administered 2018-01-26: 650 mg via ORAL
  Filled 2018-01-26: qty 2

## 2018-01-26 MED ORDER — PIPERACILLIN-TAZOBACTAM 3.375 G IVPB 30 MIN
3.3750 g | Freq: Once | INTRAVENOUS | Status: AC
  Administered 2018-01-26: 3.375 g via INTRAVENOUS
  Filled 2018-01-26 (×2): qty 50

## 2018-01-26 MED ORDER — VANCOMYCIN HCL IN DEXTROSE 1-5 GM/200ML-% IV SOLN
1000.0000 mg | Freq: Once | INTRAVENOUS | Status: AC
  Administered 2018-01-26: 1000 mg via INTRAVENOUS
  Filled 2018-01-26: qty 200

## 2018-01-26 MED ORDER — PIPERACILLIN-TAZOBACTAM IN DEX 2-0.25 GM/50ML IV SOLN
2.2500 g | Freq: Three times a day (TID) | INTRAVENOUS | Status: DC
  Administered 2018-01-27: 2.25 g via INTRAVENOUS
  Filled 2018-01-26 (×3): qty 50

## 2018-01-26 MED ORDER — SODIUM CHLORIDE 0.9 % IV BOLUS (SEPSIS)
500.0000 mL | Freq: Once | INTRAVENOUS | Status: DC
  Administered 2018-01-26: 500 mL via INTRAVENOUS

## 2018-01-26 MED ORDER — SODIUM CHLORIDE 0.9 % IV BOLUS (SEPSIS)
1000.0000 mL | Freq: Once | INTRAVENOUS | Status: AC
  Administered 2018-01-26: 500 mL via INTRAVENOUS

## 2018-01-26 MED ORDER — SODIUM CHLORIDE 0.9 % IV SOLN: Freq: Once | INTRAVENOUS | Status: DC

## 2018-01-26 MED ORDER — VANCOMYCIN HCL 10 G IV SOLR
1500.0000 mg | INTRAVENOUS | Status: DC
  Filled 2018-01-26: qty 1500

## 2018-01-26 MED ORDER — IPRATROPIUM-ALBUTEROL 0.5-2.5 (3) MG/3ML IN SOLN
3.0000 mL | Freq: Once | RESPIRATORY_TRACT | Status: AC
Start: 2018-01-26 — End: 2018-01-26
  Administered 2018-01-26: 3 mL via RESPIRATORY_TRACT
  Filled 2018-01-26: qty 3

## 2018-01-26 MED ORDER — METHYLPREDNISOLONE SODIUM SUCC 125 MG IJ SOLR
60.0000 mg | Freq: Once | INTRAMUSCULAR | Status: AC
  Administered 2018-01-26: 60 mg via INTRAVENOUS
  Filled 2018-01-26: qty 2

## 2018-01-26 NOTE — ED Triage Notes (Signed)
Pt presents by [facility] for cough and SOB.

## 2018-01-26 NOTE — ED Notes (Signed)
ED Provider at bedside. 

## 2018-01-26 NOTE — ED Notes (Signed)
Pt wheezing and resp increased, EDP made aware and fluids stopped. RT at bedside attempting 2nd IV.

## 2018-01-26 NOTE — ED Notes (Signed)
2nd set of blood cultures not obtained due to start of antibiotics and problems obtaining a second IV

## 2018-01-26 NOTE — ED Notes (Signed)
Xray to bedside.

## 2018-01-26 NOTE — Plan of Care (Signed)
Discussed case with Dr. Thomasene Lot at [facility]. Candace Long is a 72 year old female with PMH HTN, HLD, CKD stage III(baseline Cr 1.73 on 11/2017), DM type II, and  CHF last EF 40%; who presented with complaints of fatigue and cough concerning for flulike symptoms.    VS: Temperature afebrile, BP 68/32-123/41, respirations 22-28, O2 saturation 98-100% on RA.   Labs revealed WBC 3.1, hemoglobin 8.9, platelets 430, BUN 34, creatinine 3.09, lactic acid 4.17, and troponin 0.07.  EKG not reported to show signs of ischemia. Chest x-ray showing right-sided opacities concerning for pneumonia.  Similar to seen in November 2018, but had subsequently cleared.  Patient was given 1 L IV fluids with improvement of blood pressures and empiric antibiotics of Vanco and Zosyn.  [facility] called to admit.  Admit patient as inpatient to stepdown bed.

## 2018-01-26 NOTE — ED Provider Notes (Signed)
[place] EMERGENCY DEPARTMENT Provider Note   CSN: [id number] Arrival date & time: 01/23/2018  1936     History   Chief Complaint Chief Complaint  Patient presents with  . Influenza    HPI Candace Long is a 72 y.o. female.  HPI   Patient is a 72 year old female with past medical history significant for congestive heart failure, EF of 40%.  Patient's had multiple recent pneumonias.  Patient also has history of type 2 diabetes, advanced dementia chronic kidney disease stage III-hypertension hyperlipidemia.  She is brought by EMS for flulike symptoms for the last 3 days.  Is gotten much worse today.  Patient unable to participate in exam, she does not know why she is here and has no complaints at this time.  Reports 3 days of increasing fatigue and occasional cough.  5 caveat dementia.  Past Medical History:  Diagnosis Date  . CHF (congestive heart failure) ([place])   . Chronic kidney disease, stage III (moderate) ([place]) 05/24/2014  . Heart failure (HCC)    CHF  . HLD (hyperlipidemia)   . Hypertension   . Obesity   . S/P cerebral aneurysm repair 1993   some residual memory impairment  . Uncontrolled type 2 diabetes mellitus with nephropathy [facility])    DSME [facility] 06/2014, did not keep f/u classes so discharged    Patient Active Problem List   Diagnosis Date Noted  . Healthcare-associated pneumonia 10/13/2017  . Tachycardia 10/13/2017  . Acute kidney injury superimposed on CKD ([place]) Stage 3 10/13/2017  . HCAP (healthcare-associated pneumonia) 10/13/2017  . NICM (nonischemic cardiomyopathy) ([place]) 08/24/2017  . Elevated troponin 08/22/2017  . Acute systolic CHF (congestive heart failure) ([place]) 08/22/2017  . NSVT (nonsustained ventricular tachycardia) ([place]) 08/22/2017  . Atrial tachycardia ([place]) 08/22/2017  . Hypomagnesemia 08/22/2017  . Hypokalemia 08/22/2017  . Ex-smoker 08/17/2017  . 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 Normocytic anemia 09/29/2016    . Abnormal SPEP 09/29/2016  . Anemia 09/05/2016  . Hyponatremia 06/20/2016  . 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 Hearing impaired 06/10/2016  . Polyuria 06/10/2016  . Thyroid nodule 09/24/2015  . Vitamin D deficiency 09/04/2015  . Vitamin B12 deficiency 09/04/2015  . Dyspnea 09/04/2015  . Thrombocytosis ([place]) 06/10/2015  . Advanced care planning/counseling discussion 05/27/2015  . Medicare annual wellness visit, subsequent 05/24/2014  . MCI (mild cognitive impairment) with memory loss 05/24/2014  . Chronic kidney disease, stage III (moderate) ([place]) 05/24/2014  . Hypertension   . Controlled type 2 diabetes mellitus with diabetic nephropathy ([place])   . HLD (hyperlipidemia)   . Severe obesity (BMI 35.0-39.9) with comorbidity [facility])     Past Surgical History:  Procedure Laterality Date  . CARDIOVASCULAR STRESS TEST  12/2012   WNL EF 55% ([place])  . COLONOSCOPY  07/2014   1 tubular adenoma, severe diverticulosis, rpt 5 yrs [facility])  . CRANIOTOMY  1993   cerebral aneurysm repair  . RIGHT/LEFT HEART CATH AND CORONARY ANGIOGRAPHY N/A 08/22/2017   Procedure: RIGHT/LEFT HEART CATH AND CORONARY ANGIOGRAPHY;  Surgeon: Lorretta Harp, MD;  Location: [place] CV LAB;  Service: Cardiovascular;  Laterality: N/A;  . VENTRICULOPERITONEAL SHUNT  1993    OB History    No data available       Home Medications    Prior to Admission medications   Medication Sig Start Date End Date Taking? Authorizing Provider  albuterol (VENTOLIN HFA) 108 (90 Base) MCG/ACT inhaler Inhale 2 puffs into the lungs every 6 (six) hours  as needed for wheezing or shortness of breath. 12/27/15   Ria Bush, MD  amLODipine (NORVASC) 10 MG tablet TAKE 1 TABLET (10 MG TOTAL) BY MOUTH DAILY. 11/23/17   Ria Bush, MD  aspirin EC 81 MG tablet Take 81 mg by mouth daily.     [provider]  atorvastatin (LIPITOR) 40 MG tablet TAKE 1 TABLET (40 MG TOTAL) BY MOUTH DAILY. 06/01/17    Ria Bush, MD  B-D ULTRAFINE III SHORT PEN 31G X 8 MM MISC USE TO INJECT LANTUS ONCE DAILY. 07/05/17   Ria Bush, MD  carvedilol (COREG) 3.125 MG tablet Take 1 tablet (3.125 mg total) by mouth 2 (two) times daily. 10/10/17 01/08/18  Erlene Quan, PA-C  Cholecalciferol (VITAMIN D) 1000 UNITS capsule Take 1 capsule (1,000 Units total) by mouth daily. 05/27/14   Ria Bush, MD  furosemide (LASIX) 40 MG tablet TAKE 1 TABLET BY MOUTH TWICE A DAY 11/28/17   Minus Breeding, MD  hydrALAZINE (APRESOLINE) 10 MG tablet Take 1 tablet (10 mg total) every 8 (eight) hours by mouth. 10/21/17 11/20/17  Elodia Florence., MD  Insulin Glargine (LANTUS SOLOSTAR) 100 UNIT/ML Solostar Pen Inject 20 Units daily at 10 pm into the skin. 10/21/17   Elodia Florence., MD  isosorbide mononitrate (IMDUR) 30 MG 24 hr tablet TAKE 1 TABLET BY MOUTH EVERY DAY 11/28/17   Minus Breeding, MD  KLOR-CON M20 20 MEQ tablet TAKE 2 TABLETS (40 MEQ TOTAL) BY MOUTH 2 (TWO) TIMES DAILY. 11/28/17   Minus Breeding, MD  magnesium oxide (MAG-OX) 400 (241.3 Mg) MG tablet TAKE 1 TABLET BY MOUTH TWICE A DAY 11/01/17   Dunn, Dayna N, PA-C  vitamin B-12 (CYANOCOBALAMIN) 1000 MCG tablet Take 1 tablet (1,000 mcg total) by mouth daily. 05/28/15   Ria Bush, MD    Family History Family History  Problem Relation Age of Onset  . Hypertension Mother   . Diabetes Mother   . Dementia Mother   . Diabetes Sister   . Heart failure Sister   . CAD Neg Hx   . Stroke Neg Hx   . Cancer Neg Hx   . Colon cancer Neg Hx   . Esophageal cancer Neg Hx   . Rectal cancer Neg Hx   . Stomach cancer Neg Hx     Social History Social History   Tobacco Use  . Smoking status: Former Smoker    Packs/day: 1.00    Types: Cigarettes    Last attempt to quit: 12/13/2008    Years since quitting: 9.1  . Smokeless tobacco: Former Systems developer    Types: Snuff    Quit date: 12/13/2010  Substance Use Topics  . Alcohol use: No  . Drug use: No       Allergies   Cefepime and Hydrochlorothiazide   Review of Systems Review of Systems  Unable to perform ROS: Dementia  Constitutional: Positive for fatigue. Negative for activity change.  HENT: Negative for congestion.   Respiratory: Positive for cough. Negative for shortness of breath.   Cardiovascular: Negative for chest pain.  Gastrointestinal: Negative for abdominal pain.     Physical Exam Updated Vital Signs BP (!) 80/48 (BP Location: Left Arm)   Pulse 94   Temp 97.9 F (36.6 C) (Oral)   Resp (!) 23   SpO2 99%   Physical Exam  Constitutional: She appears well-developed and well-nourished.  Appears ill, pale.  HENT:  Head: Normocephalic and atraumatic.  Eyes: Right eye exhibits no discharge. Left  eye exhibits no discharge.  Neck: Neck supple.  Cardiovascular: Normal rate and regular rhythm.  Pulmonary/Chest: Effort normal and breath sounds normal.  Scattered crackles.  Abdominal: Soft. She exhibits no distension. There is no tenderness.  Neurological:  Oriented to self.  Skin: Skin is warm and dry. She is not diaphoretic.  Psychiatric: She has a normal mood and affect.  Nursing note and vitals reviewed.    ED Treatments / Results  Labs (all labs ordered are listed, but only abnormal results are displayed) Labs Reviewed  COMPREHENSIVE METABOLIC PANEL - Abnormal; Notable for the following components:      Result Value   Sodium 131 (*)    Chloride 97 (*)    CO2 19 (*)    Glucose, Bld 205 (*)    BUN 34 (*)    Creatinine, Ser 3.09 (*)    Albumin 3.4 (*)    GFR calc non Af Amer 14 (*)    GFR calc Af Amer 17 (*)    All other components within normal limits  CBG MONITORING, ED - Abnormal; Notable for the following components:   Glucose-Capillary 201 (*)    All other components within normal limits  CBC WITH DIFFERENTIAL/PLATELET  TROPONIN I  URINALYSIS, ROUTINE W REFLEX MICROSCOPIC  PATHOLOGIST SMEAR REVIEW  BRAIN NATRIURETIC PEPTIDE  I-STAT CG4  LACTIC ACID, ED    EKG  EKG Interpretation None       Radiology Dg Chest Port 1 View  Result Date: 01/18/2018 CLINICAL DATA:  Cough and shortness of breath. EXAM: PORTABLE CHEST 1 VIEW COMPARISON:  10/15/2017 FINDINGS: Right hilar fullness with straight lower margin that suggested is pulmonary. There is reticulonodular densities over the right chest. No Kerley lines, effusion, or pneumothorax. Chronic cardiomegaly. Shunt tubing over the right chest. IMPRESSION: 1. Right reticular opacities and right perihilar density that likely reflects pneumonia. Right-sided pneumonia was also seen November 2018 and subsequently cleared. Suggest lateral image when clinically appropriate. Followup PA and lateral chest X-ray is recommended in 3-4 weeks following trial of antibiotic therapy to ensure resolution and exclude underlying malignancy. 2. Chronic cardiomegaly. Electronically Signed   By: Monte Fantasia M.D.   On: 01/29/2018 20:13    Procedures Procedures (including critical care time)  CRITICAL CARE Performed by: Gardiner Sleeper Total critical care time: 80 minutes Critical care time was exclusive of separately billable procedures and treating other patients. Critical care was necessary to treat or prevent imminent or life-threatening deterioration. Critical care was time spent personally by me on the following activities: development of treatment plan with patient and/or surrogate as well as nursing, discussions with consultants, evaluation of patient's response to treatment, examination of patient, obtaining history from patient or surrogate, ordering and performing treatments and interventions, ordering and review of laboratory studies, ordering and review of radiographic studies, pulse oximetry and re-evaluation of patient's condition.  Medications Ordered in ED Medications  sodium chloride 0.9 % bolus 1,000 mL (not administered)     Initial Impression / Assessment and Plan / ED  Course  I have reviewed the triage vital signs and the nursing notes.  Pertinent labs & imaging results that were available during my care of the patient were reviewed by me and considered in my medical decision making (see chart for details).     Patient is a 72 year old female with past medical history significant for congestive heart failure, EF of 40%.  Patient's had multiple recent pneumonias.  Patient also has history of type 2 diabetes, advanced  dementia chronic kidney disease stage III-hypertension hyperlipidemia.  She is brought by EMS for flulike symptoms for the last 3 days.  Is gotten much worse today.  Patient unable to participate in exam, she does not know why she is here and has no complaints at this time.  Reports 3 days of increasing fatigue and occasional cough.  5 caveat dementia.   8:32 PM Patient is found to be hypotensive.  Then it appeared that her blood pressure normalized also change the cough.  And then subsequently appeared to go down again.  Again patient is very low EF, I suspect sepsis in this individual.  We will start with 500 cc and then 1 L of fluid.  Chest x-ray shows pneumonia.  Will start broad-spectrum antibiotics.  10:59 PM After 1 L patient's blood pressure substantially increased 113/65.  Patient does have wheezing, will former smoker, will give solumedrol  Admitted to [place] step down/    Increased respiratory rate, do not want to give further fluids at this point given her low EF.Marland Kitchen  Final Clinical Impressions(s) / ED Diagnoses   Final diagnoses:  Cough    ED Discharge Orders    None       [place]ack[place], Fredia Sorrow, MD 01/31/2018 2300

## 2018-01-26 NOTE — ED Notes (Signed)
Family member at bedside and updated with plan of care

## 2018-01-26 NOTE — ED Notes (Signed)
EDP made aware of pt BP, see MAR for orders.

## 2018-01-26 NOTE — Progress Notes (Signed)
Pharmacy Antibiotic Note  Obdulia Steier is a 72 y.o. female admitted on 01/13/2018 with cough and increasing fatigue.  Pharmacy has been consulted for vancomycin and Zosyn dosing for PNA.  Patient has a history of multiple recent PNAs per documentation.  She also has baseline CKD3.   Plan: Vanc 2gm IV x 1, then 1500mg  IV Q48H Zosyn 3.375gm IV x 1, then 2.25gm IV Q8H Monitor renal fxn, clinical progress, vanc trough as indicated   Weight: 234 lb (106.1 kg)  Temp (24hrs), Avg:97.9 F (36.6 C), Min:97.9 F (36.6 C), Max:97.9 F (36.6 C)  Recent Labs  Lab 01/13/2018 1954  CREATININE 3.09*    Estimated Creatinine Clearance: 22.3 mL/min (A) (by C-G formula based on SCr of 3.09 mg/dL (H)).    Allergies  Allergen Reactions  . Cefepime Hives  . Hydrochlorothiazide Other (See Comments)    Hypokalemia, hyponatremia     Vanc 2/14 >> Zosyn 2/14 >>   Kimorah Ridolfi D. Mina Marble, PharmD, BCPS Pager:  [phone] 01/18/2018, 8:40 PM

## 2018-01-27 ENCOUNTER — Inpatient Hospital Stay (HOSPITAL_COMMUNITY): Payer: No Typology Code available for payment source

## 2018-01-27 DIAGNOSIS — E1121 Type 2 diabetes mellitus with diabetic nephropathy: Secondary | ICD-10-CM

## 2018-01-27 DIAGNOSIS — J189 Pneumonia, unspecified organism: Secondary | ICD-10-CM

## 2018-01-27 DIAGNOSIS — R05 Cough: Secondary | ICD-10-CM

## 2018-01-27 DIAGNOSIS — N179 Acute kidney failure, unspecified: Secondary | ICD-10-CM

## 2018-01-27 DIAGNOSIS — D473 Essential (hemorrhagic) thrombocythemia: Secondary | ICD-10-CM

## 2018-01-27 DIAGNOSIS — N189 Chronic kidney disease, unspecified: Secondary | ICD-10-CM

## 2018-01-27 DIAGNOSIS — A419 Sepsis, unspecified organism: Principal | ICD-10-CM

## 2018-01-27 LAB — URINALYSIS, ROUTINE W REFLEX MICROSCOPIC
BILIRUBIN URINE: NEGATIVE
Glucose, UA: NEGATIVE mg/dL
KETONES UR: NEGATIVE mg/dL
Leukocytes, UA: NEGATIVE
Nitrite: NEGATIVE
PROTEIN: 100 mg/dL — AB
Specific Gravity, Urine: 1.015 (ref 1.005–1.030)
pH: 6 (ref 5.0–8.0)

## 2018-01-27 LAB — INFLUENZA PANEL BY PCR (TYPE A & B)
INFLAPCR: NEGATIVE
INFLBPCR: NEGATIVE

## 2018-01-27 LAB — STREP PNEUMONIAE URINARY ANTIGEN: Strep Pneumo Urinary Antigen: NEGATIVE

## 2018-01-27 LAB — BASIC METABOLIC PANEL
Anion gap: 22 — ABNORMAL HIGH (ref 5–15)
BUN: 37 mg/dL — AB (ref 6–20)
CO2: 13 mmol/L — ABNORMAL LOW (ref 22–32)
Calcium: 8.9 mg/dL (ref 8.9–10.3)
Chloride: 101 mmol/L (ref 101–111)
Creatinine, Ser: 3.09 mg/dL — ABNORMAL HIGH (ref 0.44–1.00)
GFR calc Af Amer: 17 mL/min — ABNORMAL LOW (ref >60)
GFR, EST NON AFRICAN AMERICAN: 14 mL/min — AB (ref >60)
GLUCOSE: 185 mg/dL — AB (ref 65–99)
POTASSIUM: 4.2 mmol/L (ref 3.5–5.1)
SODIUM: 136 mmol/L (ref 135–145)

## 2018-01-27 LAB — CBC
HCT: 27.8 % — ABNORMAL LOW (ref 36.0–46.0)
HEMOGLOBIN: 9.2 g/dL — AB (ref 12.0–15.0)
MCH: 31.1 pg (ref 26.0–34.0)
MCHC: 33.1 g/dL (ref 30.0–36.0)
MCV: 93.9 fL (ref 78.0–100.0)
PLATELETS: 300 10*3/uL (ref 150–400)
RBC: 2.96 MIL/uL — AB (ref 3.87–5.11)
RDW: 14.7 % (ref 11.5–15.5)
WBC: 1 10*3/uL — AB (ref 4.0–10.5)

## 2018-01-27 LAB — TROPONIN I
TROPONIN I: 0.06 ng/mL — AB (ref <0.03)
TROPONIN I: 0.09 ng/mL — AB (ref <0.03)
Troponin I: 0.07 ng/mL (ref <0.03)

## 2018-01-27 LAB — LACTIC ACID, PLASMA
LACTIC ACID, VENOUS: 5.5 mmol/L — AB (ref 0.5–1.9)
LACTIC ACID, VENOUS: 10.5 mmol/L — AB (ref 0.5–1.9)

## 2018-01-27 LAB — MRSA PCR SCREENING: MRSA BY PCR: NEGATIVE

## 2018-01-27 LAB — PROCALCITONIN: PROCALCITONIN: 46.82 ng/mL

## 2018-01-27 LAB — GLUCOSE, CAPILLARY
GLUCOSE-CAPILLARY: 170 mg/dL — AB (ref 65–99)
GLUCOSE-CAPILLARY: 181 mg/dL — AB (ref 65–99)

## 2018-01-27 MED ORDER — MIDAZOLAM HCL 2 MG/2ML IJ SOLN
INTRAMUSCULAR | Status: AC
  Filled 2018-01-27: qty 4

## 2018-01-27 MED ORDER — HEPARIN SODIUM (PORCINE) 5000 UNIT/ML IJ SOLN: 5000.0000 [IU] | Freq: Three times a day (TID) | INTRAMUSCULAR | Status: DC

## 2018-01-27 MED ORDER — SODIUM CHLORIDE 0.9 % IV SOLN
INTRAVENOUS | Status: DC
  Administered 2018-01-27: 05:00:00 via INTRAVENOUS

## 2018-01-27 MED ORDER — ONDANSETRON HCL 4 MG PO TABS: 4.0000 mg | ORAL_TABLET | Freq: Four times a day (QID) | ORAL | Status: DC | PRN

## 2018-01-27 MED ORDER — ONDANSETRON HCL 4 MG/2ML IJ SOLN
4.0000 mg | Freq: Four times a day (QID) | INTRAMUSCULAR | Status: DC | PRN
Start: 2018-01-27 — End: 2018-01-27

## 2018-01-27 MED ORDER — INSULIN ASPART 100 UNIT/ML ~~LOC~~ SOLN
0.0000 [IU] | Freq: Three times a day (TID) | SUBCUTANEOUS | Status: DC
  Administered 2018-01-27: 2 [IU] via SUBCUTANEOUS

## 2018-01-27 MED ORDER — IPRATROPIUM-ALBUTEROL 0.5-2.5 (3) MG/3ML IN SOLN: 3.0000 mL | Freq: Three times a day (TID) | RESPIRATORY_TRACT | Status: DC

## 2018-01-27 MED ORDER — SODIUM BICARBONATE 8.4 % IV SOLN
INTRAVENOUS | Status: DC
  Filled 2018-01-27 (×2): qty 150

## 2018-01-27 MED ORDER — IPRATROPIUM-ALBUTEROL 0.5-2.5 (3) MG/3ML IN SOLN
3.0000 mL | Freq: Four times a day (QID) | RESPIRATORY_TRACT | Status: DC
  Administered 2018-01-27: 3 mL via RESPIRATORY_TRACT
  Filled 2018-01-27: qty 3

## 2018-01-27 MED ORDER — CARVEDILOL 3.125 MG PO TABS
3.1250 mg | ORAL_TABLET | Freq: Two times a day (BID) | ORAL | Status: DC
  Administered 2018-01-27: 3.125 mg via ORAL
  Filled 2018-01-27: qty 1

## 2018-01-27 MED ORDER — SODIUM CHLORIDE 0.9 % IV BOLUS (SEPSIS)
500.0000 mL | Freq: Once | INTRAVENOUS | Status: AC
  Administered 2018-01-27: 500 mL via INTRAVENOUS

## 2018-01-27 MED ORDER — INSULIN ASPART 100 UNIT/ML ~~LOC~~ SOLN: 0.0000 [IU] | Freq: Every day | SUBCUTANEOUS | Status: DC

## 2018-01-27 MED ORDER — PIPERACILLIN-TAZOBACTAM 3.375 G IVPB
3.3750 g | Freq: Two times a day (BID) | INTRAVENOUS | Status: DC
  Filled 2018-01-27 (×2): qty 50

## 2018-01-27 MED ORDER — EPINEPHRINE PF 1 MG/ML IJ SOLN
0.5000 ug/min | INTRAVENOUS | Status: DC
  Filled 2018-01-27: qty 4

## 2018-01-27 MED ORDER — IPRATROPIUM-ALBUTEROL 0.5-2.5 (3) MG/3ML IN SOLN: 3.0000 mL | RESPIRATORY_TRACT | Status: DC | PRN

## 2018-01-27 MED ORDER — GUAIFENESIN ER 600 MG PO TB12
600.0000 mg | ORAL_TABLET | Freq: Two times a day (BID) | ORAL | Status: DC
  Administered 2018-01-27: 600 mg via ORAL
  Filled 2018-01-27: qty 1

## 2018-01-27 MED ORDER — ACETAMINOPHEN 650 MG RE SUPP: 650.0000 mg | Freq: Four times a day (QID) | RECTAL | Status: DC | PRN

## 2018-01-27 MED ORDER — ACETAMINOPHEN 325 MG PO TABS
650.0000 mg | ORAL_TABLET | Freq: Four times a day (QID) | ORAL | Status: DC | PRN
  Administered 2018-01-27: 650 mg via ORAL
  Filled 2018-01-27: qty 2

## 2018-01-27 MED FILL — Medication: Qty: 2 | Status: AC

## 2018-01-28 LAB — LEGIONELLA PNEUMOPHILA SEROGP 1 UR AG: L. pneumophila Serogp 1 Ur Ag: NEGATIVE

## 2018-01-30 LAB — PATHOLOGIST SMEAR REVIEW

## 2018-02-01 LAB — CULTURE, BLOOD (ROUTINE X 2)
CULTURE: NO GROWTH
SPECIAL REQUESTS: ADEQUATE

## 2018-02-03 ENCOUNTER — Telehealth: Payer: Self-pay | Admitting: Family Medicine

## 2018-02-03 NOTE — Telephone Encounter (Signed)
Spoke with son Juanda Crumble, expressed my condolences

## 2018-02-10 NOTE — Progress Notes (Addendum)
Received call from lab. Pts WBC: 1 and lactic acid: 5.5, troponin 0.06. Messaged attending MD

## 2018-02-10 NOTE — Progress Notes (Signed)
This RN called to room by pts family who state pt unresponsive. This is a change as pt alert and oriented x4 w/ occasional confusion all morning. Upon entering room, pt diaphoretic, HR in 40s, minimally responsive to sternal rub, unable to get BP. Messaged attending MD, called rapid response, brought code cart to door.

## 2018-02-10 NOTE — H&P (Signed)
History and Physical    Candace Long GBT:[id number] DOB: 1946/08/23 DOA: 01/23/2018  Referring MD/NP/PA: Dr. Thomasene Lot PCP: Ria Bush, MD  Patient coming from: [facility] transfer  Chief Complaint: Flulike symptoms  I have personally briefly reviewed patient's old medical records in [place]   HPI: Candace Long is a 72 y.o. female with medical history significant of HTN, HLD, CKD stage III(baseline Cr 1.73 on 11/2017), DM type II, and  CHF last EF 40%; who presented with complaints of fatigue and cough concerning for flulike symptoms that have progressively worsened over the last few days.  Notes complaints of subjective fever, chills, and loose stools.  Nothing seemed to improve symptoms.  Associated sent contacts include her daughter-in-law had a cough 1-2 weeks ago. Patient with history of multiple admissions for pneumonia with the last being on 11/1-11/9.  Due to patient's respiratory distress history is somewhat limited.   ED Course: Upon admission into [facility] patient was noted to be afebrile, BP 68/32-123/41, respirations 22-28, O2 saturation 98-100% on 2 liters of [place] oxygen. Labs revealed WBC 3.1, hemoglobin 8.9, platelets 430, BUN 34, creatinine 3.09, lactic acid 4.17, and troponin 0.07.  EKG not reported to show signs of ischemia. Chest x-ray showing right-sided opacities concerning for pneumonia.  Similar to seen in November 2018, but had subsequently cleared. Patient was given 1 L IV fluids with improvement of blood pressures, 60 mg of Solu-Medrol IV, breathing treatments, and empiric antibiotics of Vanco and Zosyn.  TRH called to admit.  Admit patient as inpatient to stepdown bed.  In transport patient was reportedly in significant respiratory distress requiring temporary placement on BiPAP.  Review of Systems  Constitutional: Positive for chills, fever and malaise/fatigue.  Eyes: Negative for pain and discharge.  Respiratory: Positive for cough, sputum production and  shortness of breath.   Cardiovascular: Negative for chest pain and leg swelling.  Gastrointestinal: Positive for diarrhea. Negative for nausea and vomiting.  Genitourinary: Negative for dysuria.  Musculoskeletal: Negative for falls.  Neurological: Positive for weakness. Negative for focal weakness and seizures.  Psychiatric/Behavioral: Negative for substance abuse.    Past Medical History:  Diagnosis Date  . CHF (congestive heart failure) ([place])   . Chronic kidney disease, stage III (moderate) ([place]) 05/24/2014  . Heart failure (HCC)    CHF  . HLD (hyperlipidemia)   . Hypertension   . Obesity   . S/P cerebral aneurysm repair 1993   some residual memory impairment  . Uncontrolled type 2 diabetes mellitus with nephropathy [facility])    DSME [facility] 06/2014, did not keep f/u classes so discharged    Past Surgical History:  Procedure Laterality Date  . CARDIOVASCULAR STRESS TEST  12/2012   WNL EF 55% ([place])  . COLONOSCOPY  07/2014   1 tubular adenoma, severe diverticulosis, rpt 5 yrs [facility])  . CRANIOTOMY  1993   cerebral aneurysm repair  . RIGHT/LEFT HEART CATH AND CORONARY ANGIOGRAPHY N/A 08/22/2017   Procedure: RIGHT/LEFT HEART CATH AND CORONARY ANGIOGRAPHY;  Surgeon: Lorretta Harp, MD;  Location: [place] [facility];  Service: Cardiovascular;  Laterality: N/A;  . VENTRICULOPERITONEAL SHUNT  1993     reports that she quit smoking about 9 years ago. Her smoking use included cigarettes. She smoked 1.00 pack per day. She quit smokeless tobacco use about 7 years ago. Her smokeless tobacco use included snuff. She reports that she does not drink alcohol or use drugs.  Allergies  Allergen Reactions  . Cefepime Hives  . Hydrochlorothiazide Other (See  Comments)    Hypokalemia, hyponatremia    Family History  Problem Relation Age of Onset  . Hypertension Mother   . Diabetes Mother   . Dementia Mother   . Diabetes Sister   . Heart failure Sister   . CAD Neg Hx   . Stroke Neg Hx   .  Cancer Neg Hx   . Colon cancer Neg Hx   . Esophageal cancer Neg Hx   . Rectal cancer Neg Hx   . Stomach cancer Neg Hx     Prior to Admission medications   Medication Sig Start Date End Date Taking? Authorizing Provider  albuterol (VENTOLIN HFA) 108 (90 Base) MCG/ACT inhaler Inhale 2 puffs into the lungs every 6 (six) hours as needed for wheezing or shortness of breath. 12/27/15   Ria Bush, MD  amLODipine (NORVASC) 10 MG tablet TAKE 1 TABLET (10 MG TOTAL) BY MOUTH DAILY. 11/23/17   Ria Bush, MD  aspirin EC 81 MG tablet Take 81 mg by mouth daily.     [provider]  atorvastatin (LIPITOR) 40 MG tablet TAKE 1 TABLET (40 MG TOTAL) BY MOUTH DAILY. 06/01/17   Ria Bush, MD  B-D ULTRAFINE III SHORT PEN 31G X 8 MM MISC USE TO INJECT LANTUS ONCE DAILY. 07/05/17   Ria Bush, MD  carvedilol (COREG) 3.125 MG tablet Take 1 tablet (3.125 mg total) by mouth 2 (two) times daily. 10/10/17 01/08/18  Erlene Quan, PA-C  Cholecalciferol (VITAMIN D) 1000 UNITS capsule Take 1 capsule (1,000 Units total) by mouth daily. 05/27/14   Ria Bush, MD  furosemide (LASIX) 40 MG tablet TAKE 1 TABLET BY MOUTH TWICE A DAY 11/28/17   Minus Breeding, MD  hydrALAZINE (APRESOLINE) 10 MG tablet Take 1 tablet (10 mg total) every 8 (eight) hours by mouth. 10/21/17 11/20/17  Elodia Florence., MD  Insulin Glargine (LANTUS SOLOSTAR) 100 UNIT/ML Solostar Pen Inject 20 Units daily at 10 pm into the skin. 10/21/17   Elodia Florence., MD  isosorbide mononitrate (IMDUR) 30 MG 24 hr tablet TAKE 1 TABLET BY MOUTH EVERY DAY 11/28/17   Minus Breeding, MD  KLOR-CON M20 20 MEQ tablet TAKE 2 TABLETS (40 MEQ TOTAL) BY MOUTH 2 (TWO) TIMES DAILY. 11/28/17   Minus Breeding, MD  magnesium oxide (MAG-OX) 400 (241.3 Mg) MG tablet TAKE 1 TABLET BY MOUTH TWICE A DAY 11/01/17   Dunn, Dayna N, PA-C  vitamin B-12 (CYANOCOBALAMIN) 1000 MCG tablet Take 1 tablet (1,000 mcg total) by mouth daily. 05/28/15    Ria Bush, MD    Physical Exam:  Constitutional: Elderly female who appears to be in moderate respiratory distress Vitals:   January 30, 2018 0030 01-30-2018 0037 2018/01/30 0150 01/30/2018 0155  BP: 131/70   114/68  Pulse: (!) 118     Resp: (!) 32   (!) 28  Temp:  100 F (37.8 C) 98.3 F (36.8 C)   TempSrc:  Oral Oral   SpO2: 98%   96%  Weight:   101.4 kg (223 lb 8.7 oz)   Height:   5\' 9"  (1.753 m)    Eyes: PERRL, lids and conjunctivae normal ENMT: Mucous membranes are dry. posterior pharynx clear of any exudate or lesions.  Neck: normal, supple, no masses, no thyromegaly Respiratory: Tachypneic with rhonchi, crackles, and some wheezes. Cardiovascular: Tachycardic, no murmurs / rubs / gallops. No extremity edema. 2+ pedal pulses. No carotid bruits.  Abdomen: no tenderness, no masses palpated. No hepatosplenomegaly. Bowel sounds positive.  Musculoskeletal: no  clubbing / cyanosis. No joint deformity upper and lower extremities. Good ROM, no contractures. Normal muscle tone.  Skin: no rashes, lesions, ulcers. No induration Neurologic: CN 2-12 grossly intact. Sensation intact, DTR normal. Strength 5/5 in all 4.  Psychiatric: Normal judgment and insight. Alert and oriented x 3. Normal mood.     Labs on Admission: I have personally reviewed following labs and imaging studies  CBC: Recent Labs  Lab 01/14/2018 1954  WBC 3.1*  NEUTROABS 0.2*  HGB 8.9*  HCT 27.2*  MCV 95.1  PLT 539*   Basic Metabolic Panel: Recent Labs  Lab 01/28/2018 1954  NA 131*  K 4.1  CL 97*  CO2 19*  GLUCOSE 205*  BUN 34*  CREATININE 3.09*  CALCIUM 9.0   GFR: Estimated Creatinine Clearance: 21.8 mL/min (A) (by C-G formula based on SCr of 3.09 mg/dL (H)). Liver Function Tests: Recent Labs  Lab 01/28/2018 1954  AST 38  ALT 16  ALKPHOS 91  BILITOT 1.1  PROT 7.7  ALBUMIN 3.4*   No results for input(s): LIPASE, AMYLASE in the last 168 hours. No results for input(s): AMMONIA in the last 168  hours. Coagulation Profile: No results for input(s): INR, PROTIME in the last 168 hours. Cardiac Enzymes: Recent Labs  Lab 01/21/2018 1954 01/25/2018 2335  TROPONINI 0.07* 0.07*   BNP (last 3 results) Recent Labs    08/17/17 1831  PROBNP 541.0*   HbA1C: No results for input(s): HGBA1C in the last 72 hours. CBG: Recent Labs  Lab 01/24/2018 1952  GLUCAP 201*   Lipid Profile: No results for input(s): CHOL, HDL, LDLCALC, TRIG, CHOLHDL, LDLDIRECT in the last 72 hours. Thyroid Function Tests: No results for input(s): TSH, T4TOTAL, FREET4, T3FREE, THYROIDAB in the last 72 hours. Anemia Panel: No results for input(s): VITAMINB12, FOLATE, FERRITIN, TIBC, IRON, RETICCTPCT in the last 72 hours. Urine analysis:    Component Value Date/Time   COLORURINE YELLOW 10/16/2017 1412   APPEARANCEUR CLEAR 10/16/2017 1412   LABSPEC 1.012 10/16/2017 1412   PHURINE 5.0 10/16/2017 1412   GLUCOSEU NEGATIVE 10/16/2017 1412   HGBUR NEGATIVE 10/16/2017 1412   BILIRUBINUR NEGATIVE 10/16/2017 1412   BILIRUBINUR negative 04/04/2016 1131   KETONESUR NEGATIVE 10/16/2017 1412   PROTEINUR 100 (A) 10/16/2017 1412   UROBILINOGEN 0.2 04/04/2016 1131   UROBILINOGEN 0.2 12/21/2011 0951   NITRITE NEGATIVE 10/16/2017 1412   LEUKOCYTESUR NEGATIVE 10/16/2017 1412   Sepsis Labs: No results found for this or any previous visit (from the past 240 hour(s)).   Radiological Exams on Admission: Dg Chest Port 1 View  Result Date: 02/08/2018 CLINICAL DATA:  Cough and shortness of breath. EXAM: PORTABLE CHEST 1 VIEW COMPARISON:  10/15/2017 FINDINGS: Right hilar fullness with straight lower margin that suggested is pulmonary. There is reticulonodular densities over the right chest. No Kerley lines, effusion, or pneumothorax. Chronic cardiomegaly. Shunt tubing over the right chest. IMPRESSION: 1. Right reticular opacities and right perihilar density that likely reflects pneumonia. Right-sided pneumonia was also seen November  2018 and subsequently cleared. Suggest lateral image when clinically appropriate. Followup PA and lateral chest X-ray is recommended in 3-4 weeks following trial of antibiotic therapy to ensure resolution and exclude underlying malignancy. 2. Chronic cardiomegaly. Electronically Signed   By: Monte Fantasia M.D.   On: 01/28/2018 20:13    EKG: Independently reviewed sinus rhythm with no significant ST wave changes.  Assessment/Plan Acute respiratory failure with hypoxia , sepsis due to pneumonia: Patient initially presented with leukopenia and tachypnea.  Patient requiring  oxygen to maintain O2 saturations.  Lactic acid elevated at 4.17 and chest x-ray showing right-sided pneumonia.  Patient was empirically started on antibiotics of Vanco and Zosyn.  - Admit to stepdown bed - Continuous pulse oximetry with nasal cannula oxygen as needed - BiPAP as needed - Sepsis protocol initiated without fluid bolus due to CHF history - Follow-up blood and sputum cultures - Follow-up influenza screen - Continue vancomycin and Zosyn for now  -Trend lactic acid level - Mucinex  Transient hypotension, history of essential hypertension: Resolved.  Patient initially noted to be hypotensive with blood pressures 68/32.  Blood pressures improved with 1 L of normal saline IV fluids. - IV fluids as tolerated, and will hold for signs of fluid overload - Hold Coreg, amlodipine, hydralazine, isosorbide mononitrate, Lasix - Restart home blood pressure medications as tolerated  Acute kidney injury on chronic kidney disease stage III: Baseline creatinine previously noted to be around 1.5-1.7, but patient presents with a creatinine of 3 and BUN 34 question possible prerenal cause of symptoms given previous hospitalizations course. - IV fluids as tolerated - Recheck BMP this a.m. - Hold nephrotoxic agents  Anemia of chronic disease: Hemoglobin noted to be 8.9 on admission which patient baseline hemoglobin ranges from  8-9. - Recheck CBC in a.m   Elevated troponin: This initial troponin elevated at 0.7 suspect that this is secondary to acute respiratory distress in setting of acute kidney injury. -Trend troponin  Diastolic CHF: Clinically patient appears to be fluid depleted. BNP elevated at 243, but less than previous values. Last EF noted to be around 40% from echocardiogram on 08/2017 with clean coronaries by cardiac cath.  Patient weight appears significantly less than previous office visits. - Strict I&O's and daily weights  Diabetes mellitus type II: Patient presents with a glucose of 205 on admission.  Patient's last hemoglobin A1c noted to be 6.3 on 10/14/2017.  - Hypoglycemic protocols - CBGs q. before meals and at bedtime with sensitive SSI adjust as needed  Thrombocytosis: Chronic - Continue to monitor  DVT prophylaxis: heparin Code Status:  full Family Communication: No family present at bedside Disposition Plan: tbd  Consults called: none  Admission status: inpatient  Norval Morton MD [facility] Pager [phone]   If 7PM-7AM, please contact night-coverage [email or web address] Password [email or web address]  02-06-18, 2:48 AM

## 2018-02-10 NOTE — Progress Notes (Addendum)
MD made aware that patient should receive 3 Liters fluid based on sepsis protocol, however, admitting provider has deferred any additional fluid boluses. There is some concern over patient's fluid volume status.  Patient is getting continuous fluids currently.

## 2018-02-10 NOTE — Death Summary Note (Signed)
Death Summary  Candace Long TGG:[id number] DOB: 1946-04-29 DOA: Feb 12, 2018  PCP: Ria Bush, MD  Admit date: 2018-02-12 Date of Death: Feb 13, 2018 Time of Death: 03-27-1254  History of present illness:  Candace Long was a 72 y.o. female with a history of hypertension, hyperlipidemia, chronic kidney disease stage III, diabetes mellitus type 2 and chronic systolic heart failure with last ejection fraction of 40% presented with fatigue and cough with flulike symptoms progressively getting worse.  She was found to be hypotensive initially which improved with IV fluids.  Chest x-ray was concerning for pneumonia.  She was started on broad-spectrum antibiotics and initially placed on BiPAP.  She had cardiac arrest and despite aggressive CPR, patient expired on 02/13/18.    Final Diagnoses:  Acute hypoxic respiratory failure Cardiac arrest Pneumonia Sepsis Transient hypotension Acute kidney injury on chronic kidney disease stage III Anemia of chronic disease Chronic systolic CHF Mildly positive troponins Diabetes mellitus type 2 Leukopenia   The results of significant diagnostics from this hospitalization (including imaging, microbiology, ancillary and laboratory) are listed below for reference.    Significant Diagnostic Studies: Dg Chest Port 1 View  Result Date: 02/13/2018 CLINICAL DATA:  Respiratory failure. EXAM: PORTABLE CHEST 1 VIEW COMPARISON:  02/12/2018 FINDINGS: The endotracheal tube tip is above the carina. Aortic atherosclerosis and cardiac enlargement is again noted. Decreased lung volumes. There are multifocal airspace opacities identified throughout the right lung, new from previous exam. Left lung appears clear. IMPRESSION: 1. ET tube tip is positioned above the carina. 2. Worsening aeration to the right lung. Electronically Signed   By: Kerby Moors M.D.   On: 02-13-18 12:36   Dg Chest Port 1 View  Result Date: 02-12-2018 CLINICAL DATA:  Cough and shortness of  breath. EXAM: PORTABLE CHEST 1 VIEW COMPARISON:  10/15/2017 FINDINGS: Right hilar fullness with straight lower margin that suggested is pulmonary. There is reticulonodular densities over the right chest. No Kerley lines, effusion, or pneumothorax. Chronic cardiomegaly. Shunt tubing over the right chest. IMPRESSION: 1. Right reticular opacities and right perihilar density that likely reflects pneumonia. Right-sided pneumonia was also seen November 2018 and subsequently cleared. Suggest lateral image when clinically appropriate. Followup PA and lateral chest X-ray is recommended in 3-4 weeks following trial of antibiotic therapy to ensure resolution and exclude underlying malignancy. 2. Chronic cardiomegaly. Electronically Signed   By: Monte Fantasia M.D.   On: 2018/02/12 20:13    Microbiology: Recent Results (from the past 240 hour(s))  MRSA PCR Screening     Status: None   Collection Time: 02-13-18  1:50 AM  Result Value Ref Range Status   MRSA by PCR NEGATIVE NEGATIVE Final    Comment:        The GeneXpert MRSA Assay (FDA approved for NASAL specimens only), is one component of a comprehensive MRSA colonization surveillance program. It is not intended to diagnose MRSA infection nor to guide or monitor treatment for MRSA infections. Performed at [facility], [place] [address], [place], [place] [address]      Labs: Basic Metabolic Panel: Recent Labs  Lab 02/12/18 1954 02-13-2018 0755  NA 131* 136  K 4.1 4.2  CL 97* 101  CO2 19* 13*  GLUCOSE 205* 185*  BUN 34* 37*  CREATININE 3.09* 3.09*  CALCIUM 9.0 8.9   Liver Function Tests: Recent Labs  Lab 02-12-2018 1954  AST 38  ALT 16  ALKPHOS 91  BILITOT 1.1  PROT 7.7  ALBUMIN 3.4*   No results for input(s): LIPASE,  AMYLASE in the last 168 hours. No results for input(s): AMMONIA in the last 168 hours. CBC: Recent Labs  Lab 01/25/2018 1954 Feb 17, 2018 0755  WBC 3.1* 1.0*  NEUTROABS 0.2*  --   HGB 8.9* 9.2*  HCT 27.2* 27.8*    MCV 95.1 93.9  PLT 430* 300   Cardiac Enzymes: Recent Labs  Lab 02/06/2018 1954 01/18/2018 2335 02/17/18 0755 2018-02-17 1148  TROPONINI 0.07* 0.07* 0.06* 0.09*   D-Dimer No results for input(s): DDIMER in the last 72 hours. BNP: Invalid input(s): POCBNP CBG: Recent Labs  Lab 02/08/2018 1952 02/17/2018 0318 17-Feb-2018 0756  GLUCAP 201* 170* 181*   Anemia work up No results for input(s): VITAMINB12, FOLATE, FERRITIN, TIBC, IRON, RETICCTPCT in the last 72 hours. Urinalysis    Component Value Date/Time   COLORURINE YELLOW 10/16/2017 1412   APPEARANCEUR CLEAR 10/16/2017 1412   LABSPEC 1.012 10/16/2017 1412   PHURINE 5.0 10/16/2017 1412   GLUCOSEU NEGATIVE 10/16/2017 1412   HGBUR NEGATIVE 10/16/2017 1412   BILIRUBINUR NEGATIVE 10/16/2017 1412   BILIRUBINUR negative 04/04/2016 1131   KETONESUR NEGATIVE 10/16/2017 1412   PROTEINUR 100 (A) 10/16/2017 1412   UROBILINOGEN 0.2 04/04/2016 1131   UROBILINOGEN 0.2 12/21/2011 0951   NITRITE NEGATIVE 10/16/2017 1412   LEUKOCYTESUR NEGATIVE 10/16/2017 1412   Sepsis Labs Invalid input(s): PROCALCITONIN,  WBC,  LACTICIDVEN     SIGNED:  Aline August, MD  [facility] 02/17/2018, 3:27 PM Pager: [phone]  If 7PM-7AM, please contact night-coverage [email or web address] Password [email or web address]

## 2018-02-10 NOTE — Code Documentation (Addendum)
PATIENT NAME: Candace Long MEDICAL RECORD NUMBER: [id number] Birthday: July 03, 1946  Age: 72 y.o. Admit Date: 02/05/2018  Provider: Starla Link   Brief history This is a 72 year old female who was admitted on 2/14 with a working diagnosis of pneumonia.  She was admitted to the medical stepdown unit where she was receiving appropriate IV therapy, antibiotics, and supplemental oxygen.  At 11:30 AM the patient was found unresponsive, with agonal respiratory pattern, and progressed rapidly to PEA arrest.  She underwent extensive ACLS, this included several rounds of epinephrine, bicarbonate, and intubation.  Return of spontaneous circulation was obtained intermittently,, but at approximately 1227 after almost an hour of CPR the patient had a weak but palpable pulse.  Right femoral central line placed emergently.  At that point given her prolonged resuscitation efforts we discussed further ongoing efforts with the family.  At that point we agreed to continue epinephrine infusion and mechanical ventilation but will provide no further CPR given the unfortunate futility of the situation.  The family was encouraged to be at the patient's bedside     Technical Description:   CPR performance duration: !60 minutes  Was defibrillation or cardioversion used ? NO  Was external pacer placed ? NO  Was patient intubated pre/post CPR  during  Was transvenous pacer placed ? No   Medications Administered Include      Yes/no Amiodarone   Atropin `no  Calcium x1  Epinephrine X8 and infusion   Lidocaine no  Magnesium No   Norepinephrine Yes and infusion   Phenylephrine No   Sodium bicarbonate 3  Vasopression No     Impression Cardiopulmonary arrest  PEA Pneumonia Profound lactic acidosis Profound metabolic acidosis  Evaluation  Final Status - Was patient successfully resuscitated ? no  If successfully resuscitated - what is current rhythm ? PEA   Miscellaneous Information The family was  encouraged to be at bedside during final minutes.   Erick Colace ACNP-BC [place] Pager # [phone] OR # [phone] if no answer   Clementeen Graham 02/18/1911:42 PM   Attending note: Agree with the note.  I was present at the bedside during the code and helped with resuscitation  Marshell Garfinkel MD [facility] Pager [phone] If no answer or after 3pm call: [phone] February 17, 2018, 5:28 PM

## 2018-02-10 NOTE — Progress Notes (Addendum)
Messaged attending MD re:"Pts HR has been 115-125 since admit. BP good, last: 150/90. Per PTA meds, pt takes coreg 3.125 BID. Do we want to restart?"  Received call back from Dr. Starla Link. Relayed that pt has been tachycardic since admit as well as tachypnic w/ dyspnea. Orders to restart coreg 3.125 mg BID given

## 2018-02-10 NOTE — Progress Notes (Addendum)
Spoke w/ RT regarding pts tachypnea. Per RT, pt has been tachypnic since admit. Per RT, Bipap has not had any effect on respiratory rate. Pt currently on 3L, increased to 5 briefly, however still tachypnic, despite increase

## 2018-02-10 NOTE — ED Notes (Signed)
1st set of blood cultures obtained when inserting IV

## 2018-02-10 NOTE — Progress Notes (Signed)
@  [facility] called, post-mortem checklist complete, spoke w/ patient placement.  Pts son given number for Patient Placement and instructed to call when decide on funeral home. Pts son states understanding and appreciative.   Body prepared and taken down to morgue.

## 2018-02-10 NOTE — Progress Notes (Signed)
Pt w/ no pulse and agonal breathing. Code called

## 2018-02-10 NOTE — Progress Notes (Deleted)
Pt incontinent w/ large bowel movement. Bath given and full linen change

## 2018-02-10 NOTE — Progress Notes (Addendum)
CRITICAL VALUE ALERT  Critical Value:  Lactic (10.5)  Date & Time Notied:  2018-02-12 1319  Provider Notified: patient is deceased  Orders Received/Actions taken:

## 2018-02-10 NOTE — ED Notes (Signed)
[facility] arrived to transport pt to [facility] 

## 2018-02-10 NOTE — Progress Notes (Signed)
Pt incontinent w/ large bowel movement. Full linen change and bath given

## 2018-02-10 NOTE — Progress Notes (Signed)
Code called. Patient came back and continued to code several times.  Family was very upset and I sought to offer comfort during all of this yet difficult to receive care. Team worked very hard and hoped to move dto 2 H yet eventually did not make it.  Got tissues, offered prayer.  Conard Novak, Chaplain   02/01/18 1200  Clinical Encounter Type  Visited With Family;Patient not available  Visit Type Code  Referral From Other (Comment) (code)  Consult/Referral To Chaplain  Spiritual Encounters  Spiritual Needs Prayer;Emotional;Grief support

## 2018-02-10 NOTE — Progress Notes (Signed)
Pharmacy Antibiotic Note  Candace Long is a 72 y.o. female admitted on 01/29/2018 with cough and increasing fatigue.  Pharmacy has been consulted for vancomycin and Zosyn dosing for PNA.   Day #2 of abx for PNA, hx mult recent PNAs. Febrile neutropenia? Although has allergy to cefepime. MRSA PCR negative. Tmax of 100.9, WBC 1.0  Plan: Continue vancomycin 500mg  IV Q48H Change to Zosyn 3.375gm IV Q12h (EI) Monitor renal fxn, clinical progress, vanc trough as indicated  Consider stopping vancomycin with negative MRSA PCR   Height: 5\' 9"  (175.3 cm) Weight: 223 lb 8.7 oz (101.4 kg) IBW/kg (Calculated) : 66.2  Temp (24hrs), Avg:99.5 F (37.5 C), Min:97.9 F (36.6 C), Max:100.9 F (38.3 C)  Recent Labs  Lab 01/18/2018 1954 01/29/2018 2051 02/08/2018 2349 12-Feb-2018 0755  WBC 3.1*  --   --  1.0*  CREATININE 3.09*  --   --   --   LATICACIDVEN  --  4.13* 2.92*  --     Estimated Creatinine Clearance: 21.8 mL/min (A) (by C-G formula based on SCr of 3.09 mg/dL (H)).    Allergies  Allergen Reactions  . Cefepime Hives  . Hydrochlorothiazide Other (See Comments)    Hypokalemia, hyponatremia    Elenor Quinones, PharmD, [facility] Clinical Pharmacist Pager [phone] February 12, 2018 9:37 AM

## 2018-02-10 NOTE — Progress Notes (Addendum)
Candace Long, 43M Director spoke w/ patient's son and daughter at bedside.  Both pts daughter and pts son agree not to have autopsy for pt.

## 2018-02-10 NOTE — Procedures (Signed)
Intubation Procedure Note Candace Long [id number] 1946-02-24  Procedure: Intubation Indications: Respiratory insufficiency  Procedure Details Consent: Unable to obtain consent because of emergent medical necessity. Time Out: Verified patient identification, verified procedure, site/side was marked, verified correct patient position, special equipment/implants available, medications/allergies/relevent history reviewed, required imaging and test results available.  Performed  Maximum sterile technique was used including antiseptics, cap, gloves, gown, hand hygiene, mask and sheet.  MAC and 4    Evaluation Hemodynamic Status: Persistent hypotension treated with pressors; O2 sats: transiently fell during during procedure Patient's Current Condition: stable Complications: No apparent complications Patient did tolerate procedure well. Chest X-ray ordered to verify placement.  CXR: tube position acceptable.   Clementeen Graham Feb 05, 2018  Erick Colace ACNP-BC [facility] Pager # [phone] OR # [phone] if no answer

## 2018-02-10 NOTE — Procedures (Signed)
Central Venous Catheter Insertion Procedure Note Candace Long [id number] 07-20-1946  Procedure: Insertion of Central Venous Catheter Indications: Drug and/or fluid administration  Procedure Details Consent: Unable to obtain consent because of emergent medical necessity. Time Out: Verified patient identification, verified procedure, site/side was marked, verified correct patient position, special equipment/implants available, medications/allergies/relevent history reviewed, required imaging and test results available.  Performed  A antimicrobial bonded/coated triple lumen catheter was placed in the right femoral vein due to emergent situation using the Seldinger technique.  Evaluation Blood flow good Complications: No apparent complications Patient did tolerate procedure well.  Mckinzi Eriksen 31-Jan-2018, 5:30 PM

## 2018-02-10 NOTE — ED Notes (Signed)
ED Provider at bedside to reeval pt prior to transport.

## 2018-02-10 NOTE — ED Notes (Signed)
Pt son Verdie Shire and who pt lives at home with [phone])

## 2018-02-10 NOTE — Progress Notes (Addendum)
Pts bolus complete, NS restarted at 125. Pt sitting up in bed, states no needs. Messaged attending MD re:"bolus complete. Do we want to recheck lactic?"

## 2018-02-10 DEATH — deceased

## 2018-02-15 ENCOUNTER — Ambulatory Visit: Payer: Medicare Other

## 2018-02-22 ENCOUNTER — Other Ambulatory Visit: Payer: Self-pay | Admitting: Family Medicine

## 2018-03-17 ENCOUNTER — Ambulatory Visit: Payer: Medicare Other | Admitting: Physician Assistant

## 2018-05-02 IMAGING — CR DG CHEST 1V PORT
1 series · 1 of 1 positions shown · non-contrast
Comparison: 01/26/2018

CLINICAL DATA: Respiratory failure.

EXAM:
PORTABLE CHEST 1 VIEW

[AP]
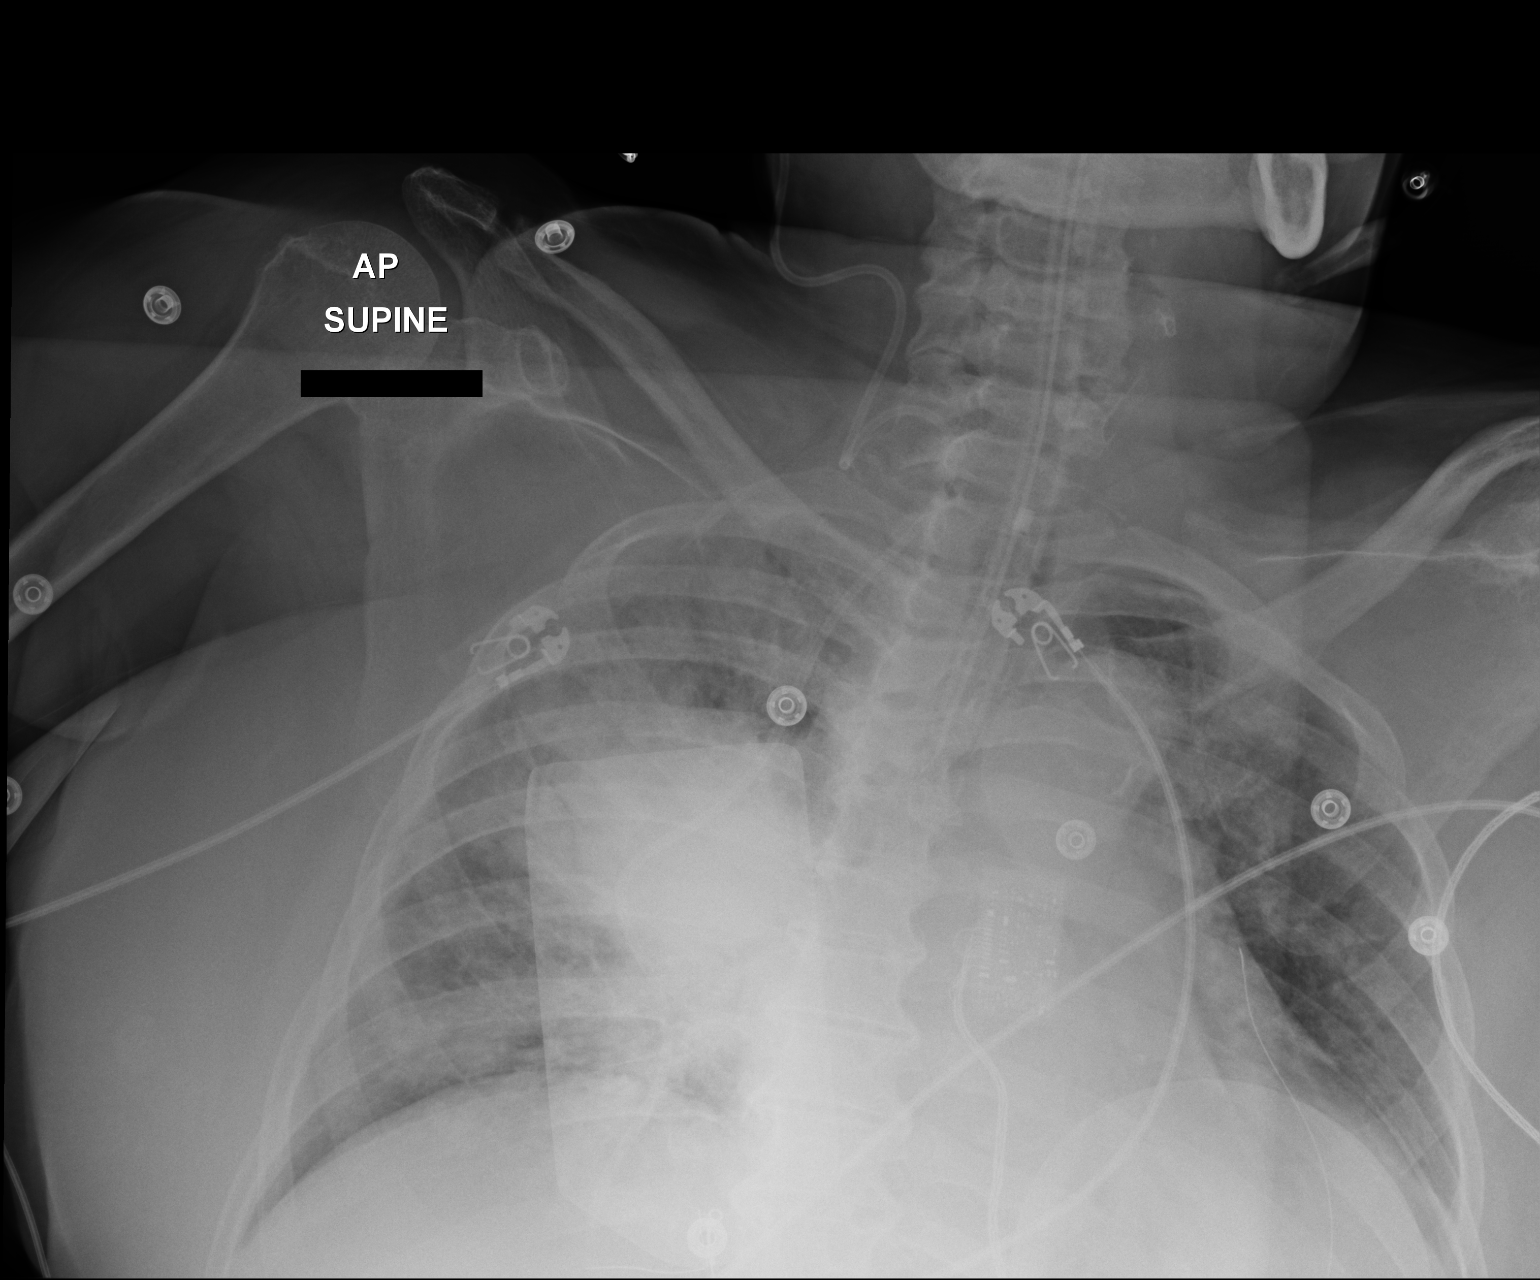

[1 of 1 positions shown; findings below may reference images not displayed]

FINDINGS: The endotracheal tube tip is above the carina. Aortic
atherosclerosis and cardiac enlargement is again noted. Decreased
lung volumes. There are multifocal airspace opacities identified
throughout the right lung, new from previous exam. Left lung appears
clear.
IMPRESSION: 1. ET tube tip is positioned above the carina.
2. Worsening aeration to the right lung.

## 2018-06-05 ENCOUNTER — Ambulatory Visit: Payer: Medicare Other | Admitting: Diagnostic Neuroimaging
